# Patient Record
Sex: Female | Born: 1950 | Race: White | Hispanic: No | Smoking: Former smoker
Health system: Southern US, Community
[De-identification: ages and names within clinical notes are randomized; demographics above are authoritative.]

## PROBLEM LIST (undated history)

## (undated) DIAGNOSIS — E785 Hyperlipidemia, unspecified: Secondary | ICD-10-CM

## (undated) DIAGNOSIS — I4891 Unspecified atrial fibrillation: Secondary | ICD-10-CM

## (undated) DIAGNOSIS — J45909 Unspecified asthma, uncomplicated: Secondary | ICD-10-CM

## (undated) DIAGNOSIS — I5042 Chronic combined systolic (congestive) and diastolic (congestive) heart failure: Secondary | ICD-10-CM

## (undated) DIAGNOSIS — I509 Heart failure, unspecified: Secondary | ICD-10-CM

## (undated) DIAGNOSIS — I10 Essential (primary) hypertension: Secondary | ICD-10-CM

## (undated) HISTORY — DX: Unspecified atrial fibrillation: I48.91

## (undated) HISTORY — DX: Hyperlipidemia, unspecified: E78.5

## (undated) HISTORY — DX: Heart failure, unspecified: I50.9

---
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
## 2001-03-12 ENCOUNTER — Other Ambulatory Visit: Admission: RE | Admit: 2001-03-12 | Discharge: 2001-03-12 | Payer: Self-pay | Admitting: Obstetrics and Gynecology

## 2001-04-15 ENCOUNTER — Emergency Department (HOSPITAL_COMMUNITY): Admission: EM | Admit: 2001-04-15 | Discharge: 2001-04-15 | Payer: Self-pay | Admitting: Emergency Medicine

## 2001-05-11 DIAGNOSIS — K222 Esophageal obstruction: Secondary | ICD-10-CM

## 2001-05-12 ENCOUNTER — Other Ambulatory Visit: Admission: RE | Admit: 2001-05-12 | Discharge: 2001-05-12 | Payer: Self-pay | Admitting: Gastroenterology

## 2001-05-12 ENCOUNTER — Encounter (INDEPENDENT_AMBULATORY_CARE_PROVIDER_SITE_OTHER): Payer: Self-pay | Admitting: Specialist

## 2005-01-30 ENCOUNTER — Emergency Department (HOSPITAL_COMMUNITY): Admission: EM | Admit: 2005-01-30 | Discharge: 2005-01-30 | Payer: Self-pay | Admitting: Emergency Medicine

## 2007-06-10 ENCOUNTER — Ambulatory Visit (HOSPITAL_COMMUNITY): Admission: RE | Admit: 2007-06-10 | Discharge: 2007-06-10 | Payer: Self-pay | Admitting: *Deleted

## 2007-07-09 ENCOUNTER — Ambulatory Visit: Payer: Self-pay | Admitting: Family Medicine

## 2007-07-09 ENCOUNTER — Encounter (INDEPENDENT_AMBULATORY_CARE_PROVIDER_SITE_OTHER): Payer: Self-pay | Admitting: Internal Medicine

## 2007-07-09 DIAGNOSIS — F329 Major depressive disorder, single episode, unspecified: Secondary | ICD-10-CM | POA: Insufficient documentation

## 2007-07-09 DIAGNOSIS — I10 Essential (primary) hypertension: Secondary | ICD-10-CM | POA: Insufficient documentation

## 2007-07-09 DIAGNOSIS — D126 Benign neoplasm of colon, unspecified: Secondary | ICD-10-CM | POA: Insufficient documentation

## 2007-07-09 DIAGNOSIS — G43909 Migraine, unspecified, not intractable, without status migrainosus: Secondary | ICD-10-CM | POA: Insufficient documentation

## 2007-08-17 ENCOUNTER — Ambulatory Visit: Payer: Self-pay | Admitting: Family Medicine

## 2007-08-25 ENCOUNTER — Telehealth (INDEPENDENT_AMBULATORY_CARE_PROVIDER_SITE_OTHER): Payer: Self-pay | Admitting: *Deleted

## 2007-09-22 ENCOUNTER — Encounter (INDEPENDENT_AMBULATORY_CARE_PROVIDER_SITE_OTHER): Payer: Self-pay | Admitting: *Deleted

## 2007-12-17 ENCOUNTER — Encounter (INDEPENDENT_AMBULATORY_CARE_PROVIDER_SITE_OTHER): Payer: Self-pay | Admitting: Internal Medicine

## 2008-03-14 ENCOUNTER — Ambulatory Visit: Payer: Self-pay | Admitting: Family Medicine

## 2008-03-14 DIAGNOSIS — H60509 Unspecified acute noninfective otitis externa, unspecified ear: Secondary | ICD-10-CM

## 2008-03-21 ENCOUNTER — Ambulatory Visit: Payer: Self-pay

## 2008-03-21 ENCOUNTER — Encounter: Payer: Self-pay | Admitting: Family Medicine

## 2008-03-21 DIAGNOSIS — I359 Nonrheumatic aortic valve disorder, unspecified: Secondary | ICD-10-CM | POA: Insufficient documentation

## 2008-04-04 ENCOUNTER — Encounter (INDEPENDENT_AMBULATORY_CARE_PROVIDER_SITE_OTHER): Payer: Self-pay | Admitting: Internal Medicine

## 2008-04-19 ENCOUNTER — Encounter (INDEPENDENT_AMBULATORY_CARE_PROVIDER_SITE_OTHER): Payer: Self-pay | Admitting: *Deleted

## 2011-01-15 NOTE — Op Note (Signed)
NAME:  Lorraine Harris, Lorraine Harris           ACCOUNT NO.:  [id number]   MEDICAL RECORD NO.:  [id number]          PATIENT TYPE:  AMB   LOCATION:  SDS                          FACILITY:  [facility]   PHYSICIAN:  Mark C. Ophelia Charter, M.D.    DATE OF BIRTH:  1950/09/29   DATE OF PROCEDURE:  06/10/2007  DATE OF DISCHARGE:                               OPERATIVE REPORT   PREOPERATIVE DIAGNOSIS:  Left displaced angulated intra-articular distal  radius fracture with 2 distal fragments.   POSTOPERATIVE DIAGNOSIS:  Left displaced angulated intra-articular  distal radius fracture with 2 distal fragments.   PROCEDURE:  Open reduction and internal fixation, left distal radius,  volar plating.   SURGEON:  Annell Greening, MD   ANESTHESIA:  GOT.   TOURNIQUET TIME:  Twenty-six minutes.   DRAINS:  None.   DESCRIPTION OF PROCEDURE:  After induction of general anesthesia,  proximal arm tourniquet, standard prepping with DuraPrep and the usual  extremity sheets and drapes, a time-out procedure was taken.  The  patient received preoperative Ancef; this was in before the arm was  elevated, wrapped in an Esmarch and tourniquet inflated.   Incision was made adjacent to the radial artery and the flexor carpi  radialis sheath of the posterior aspect the flexor carpal radialis was  split.  The pronator was pulled from the radial aspect toward the ulna  and a self-retaining retractor was placed.  Fracture was reduced.  The  smaller DVR plate for the left wrist was selected, placed in appropriate  position and K-wire placed.  It was slightly distal and the reduction  had been lost slightly.  Reduction was repeated.  Hole was drilled in  one slot and the large purple screw, 12-mm bicortical, was placed  followed by a second screw, bicortical, slightly more proximal and  through the end of the plate.  Next, the reduction was performed and  then at the distal row was filled first, starting from the ulnar aspect,  catching the  lunate fragment, followed by progressing to the radial  aspect, catching the styloid fragment.  AP and lateral showed excellent  position.  Proximal rows were then filled.  All screws were tightened  down and all locked in place.  Final spot pictures were taken,  tourniquet was deflated and wound irrigated.  A 3-0 Vicryl subcutaneous  reapproximation of subcu tissue was performed with interrupted simple  sutures.  A nylon running subcuticular 4-0 suture was run with a  loop in the middle, tincture of Benzoin, Steri-Strips, Marcaine  infiltration, 4 x 4's, tweeners, Webril, dorsal wrist splint in neutral  position.  The patient tolerated the procedure well and was transferred  to the recovery room in stable condition.      Mark C. Ophelia Charter, M.D.  Electronically Signed     MCY/MEDQ  D:  06/10/2007  T:  06/11/2007  Job:  [id number]

## 2011-06-13 LAB — COMPREHENSIVE METABOLIC PANEL
ALT: 15
BUN: 8
CO2: 25
Calcium: 9.4
Chloride: 108
GFR calc Af Amer: >60
Sodium: 142
Total Bilirubin: 0.5

## 2011-06-13 LAB — CBC
Hemoglobin: 14.1
MCV: 93.8
Platelets: 275
RBC: 4.5
WBC: 7.2

## 2011-06-13 LAB — URINALYSIS, ROUTINE W REFLEX MICROSCOPIC
Bilirubin Urine: NEGATIVE
Hgb urine dipstick: NEGATIVE
Nitrite: NEGATIVE
Specific Gravity, Urine: 1.014
Urobilinogen, UA: 0.2

## 2012-11-04 ENCOUNTER — Other Ambulatory Visit: Payer: Self-pay | Admitting: Orthopaedic Surgery

## 2012-11-04 DIAGNOSIS — M79604 Pain in right leg: Secondary | ICD-10-CM

## 2012-11-04 DIAGNOSIS — R609 Edema, unspecified: Secondary | ICD-10-CM

## 2012-11-04 DIAGNOSIS — M79605 Pain in left leg: Secondary | ICD-10-CM

## 2012-11-09 ENCOUNTER — Ambulatory Visit
Admission: RE | Admit: 2012-11-09 | Discharge: 2012-11-09 | Disposition: A | Discharge status: Home / Self Care | Payer: BC Managed Care – PPO | Source: Ambulatory Visit | Attending: Orthopaedic Surgery | Admitting: Orthopaedic Surgery

## 2012-11-09 DIAGNOSIS — M79604 Pain in right leg: Secondary | ICD-10-CM

## 2012-11-09 DIAGNOSIS — R609 Edema, unspecified: Secondary | ICD-10-CM

## 2013-06-18 ENCOUNTER — Emergency Department (HOSPITAL_COMMUNITY)
Admission: EM | Admit: 2013-06-18 | Discharge: 2013-06-19 | Disposition: A | Discharge status: Home / Self Care | Payer: No Typology Code available for payment source | Attending: Emergency Medicine | Admitting: Emergency Medicine

## 2013-06-18 DIAGNOSIS — IMO0002 Reserved for concepts with insufficient information to code with codable children: Secondary | ICD-10-CM | POA: Insufficient documentation

## 2013-06-18 DIAGNOSIS — S3981XA Other specified injuries of abdomen, initial encounter: Secondary | ICD-10-CM | POA: Diagnosis not present

## 2013-06-18 DIAGNOSIS — Y9389 Activity, other specified: Secondary | ICD-10-CM | POA: Diagnosis not present

## 2013-06-18 DIAGNOSIS — S335XXA Sprain of ligaments of lumbar spine, initial encounter: Secondary | ICD-10-CM | POA: Diagnosis not present

## 2013-06-18 DIAGNOSIS — S8392XA Sprain of unspecified site of left knee, initial encounter: Secondary | ICD-10-CM

## 2013-06-18 DIAGNOSIS — Z79899 Other long term (current) drug therapy: Secondary | ICD-10-CM | POA: Insufficient documentation

## 2013-06-18 DIAGNOSIS — S298XXA Other specified injuries of thorax, initial encounter: Secondary | ICD-10-CM | POA: Diagnosis not present

## 2013-06-18 DIAGNOSIS — I10 Essential (primary) hypertension: Secondary | ICD-10-CM | POA: Diagnosis not present

## 2013-06-18 DIAGNOSIS — E669 Obesity, unspecified: Secondary | ICD-10-CM | POA: Diagnosis not present

## 2013-06-18 DIAGNOSIS — Y92410 Unspecified street and highway as the place of occurrence of the external cause: Secondary | ICD-10-CM | POA: Insufficient documentation

## 2013-06-18 DIAGNOSIS — J45909 Unspecified asthma, uncomplicated: Secondary | ICD-10-CM | POA: Insufficient documentation

## 2013-06-18 DIAGNOSIS — S39012A Strain of muscle, fascia and tendon of lower back, initial encounter: Secondary | ICD-10-CM

## 2013-06-18 HISTORY — DX: Essential (primary) hypertension: I10

## 2013-06-18 HISTORY — DX: Unspecified asthma, uncomplicated: J45.909

## 2013-06-18 NOTE — ED Notes (Signed)
Bed: WA09 Expected date:  Expected time:  Means of arrival:  Comments: EMS 
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
## 2013-06-19 ENCOUNTER — Encounter (HOSPITAL_COMMUNITY): Payer: Self-pay | Admitting: Emergency Medicine

## 2013-06-19 ENCOUNTER — Emergency Department (HOSPITAL_COMMUNITY): Payer: No Typology Code available for payment source

## 2013-06-19 DIAGNOSIS — S335XXA Sprain of ligaments of lumbar spine, initial encounter: Secondary | ICD-10-CM | POA: Diagnosis not present

## 2013-06-19 LAB — COMPREHENSIVE METABOLIC PANEL
Albumin: 3.8 g/dL (ref 3.5–5.2)
Alkaline Phosphatase: 99 U/L (ref 39–117)
BUN: 21 mg/dL (ref 6–23)
CO2: 23 mEq/L (ref 19–32)
Calcium: 8.8 mg/dL (ref 8.4–10.5)
Chloride: 105 mEq/L (ref 96–112)
Creatinine, Ser: 0.93 mg/dL (ref 0.50–1.10)
GFR calc non Af Amer: 64 mL/min — ABNORMAL LOW (ref >90)
Glucose, Bld: 119 mg/dL — ABNORMAL HIGH (ref 70–99)
Potassium: 3.5 mEq/L (ref 3.5–5.1)
Total Protein: 7 g/dL (ref 6.0–8.3)

## 2013-06-19 LAB — URINALYSIS, ROUTINE W REFLEX MICROSCOPIC
Bilirubin Urine: NEGATIVE
Glucose, UA: NEGATIVE mg/dL
Hgb urine dipstick: NEGATIVE
Ketones, ur: NEGATIVE mg/dL
Leukocytes, UA: NEGATIVE
Nitrite: NEGATIVE
Protein, ur: NEGATIVE mg/dL
pH: 5 (ref 5.0–8.0)

## 2013-06-19 LAB — POCT I-STAT CREATININE: Creatinine, Ser: 1.2 mg/dL — ABNORMAL HIGH (ref 0.50–1.10)

## 2013-06-19 LAB — CBC
MCHC: 34.2 g/dL (ref 30.0–36.0)
RBC: 4.47 MIL/uL (ref 3.87–5.11)
RDW: 13.6 % (ref 11.5–15.5)

## 2013-06-19 MED ORDER — DIPHENHYDRAMINE HCL 50 MG/ML IJ SOLN
25.0000 mg | Freq: Once | INTRAMUSCULAR | Status: AC
  Administered 2013-06-19: 25 mg via INTRAVENOUS

## 2013-06-19 MED ORDER — METHYLPREDNISOLONE SODIUM SUCC 125 MG IJ SOLR
125.0000 mg | Freq: Once | INTRAMUSCULAR | Status: AC
  Administered 2013-06-19: 125 mg via INTRAVENOUS
  Filled 2013-06-19: qty 2

## 2013-06-19 MED ORDER — EPINEPHRINE 0.3 MG/0.3ML IJ SOAJ
0.3000 mg | Freq: Once | INTRAMUSCULAR | Status: AC
  Administered 2013-06-19: 0.3 mg via INTRAMUSCULAR
  Filled 2013-06-19: qty 0.3

## 2013-06-19 MED ORDER — MORPHINE SULFATE 4 MG/ML IJ SOLN
4.0000 mg | Freq: Once | INTRAMUSCULAR | Status: AC
  Administered 2013-06-19: 4 mg via INTRAVENOUS
  Filled 2013-06-19: qty 1

## 2013-06-19 MED ORDER — ALBUTEROL SULFATE HFA 108 (90 BASE) MCG/ACT IN AERS
2.0000 | INHALATION_SPRAY | Freq: Once | RESPIRATORY_TRACT | Status: AC
  Administered 2013-06-19: 2 via RESPIRATORY_TRACT
  Filled 2013-06-19: qty 6.7

## 2013-06-19 MED ORDER — IOHEXOL 300 MG/ML  SOLN
100.0000 mL | Freq: Once | INTRAMUSCULAR | Status: AC | PRN
  Administered 2013-06-19: 100 mL via INTRAVENOUS

## 2013-06-19 MED ORDER — DIPHENHYDRAMINE HCL 50 MG/ML IJ SOLN
25.0000 mg | Freq: Once | INTRAMUSCULAR | Status: AC
  Administered 2013-06-19: 25 mg via INTRAVENOUS
  Filled 2013-06-19: qty 1
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
## 2014-09-14 ENCOUNTER — Encounter (HOSPITAL_COMMUNITY): Payer: Self-pay | Admitting: Pharmacy Technician

## 2014-09-20 ENCOUNTER — Ambulatory Visit (HOSPITAL_COMMUNITY)
Admission: RE | Admit: 2014-09-20 | Discharge: 2014-09-20 | Disposition: A | Discharge status: Home / Self Care | Payer: BLUE CROSS/BLUE SHIELD | Source: Ambulatory Visit | Attending: Cardiology | Admitting: Cardiology

## 2014-09-20 ENCOUNTER — Encounter (HOSPITAL_COMMUNITY)
Admission: RE | Disposition: A | Discharge status: Home / Self Care | Payer: Self-pay | Source: Ambulatory Visit | Attending: Cardiology

## 2014-09-20 ENCOUNTER — Encounter (HOSPITAL_COMMUNITY): Payer: Self-pay | Admitting: Cardiology

## 2014-09-20 DIAGNOSIS — I420 Dilated cardiomyopathy: Secondary | ICD-10-CM

## 2014-09-20 DIAGNOSIS — I429 Cardiomyopathy, unspecified: Secondary | ICD-10-CM | POA: Diagnosis not present

## 2014-09-20 DIAGNOSIS — I447 Left bundle-branch block, unspecified: Secondary | ICD-10-CM | POA: Diagnosis not present

## 2014-09-20 DIAGNOSIS — R0602 Shortness of breath: Secondary | ICD-10-CM | POA: Diagnosis present

## 2014-09-20 DIAGNOSIS — I10 Essential (primary) hypertension: Secondary | ICD-10-CM | POA: Diagnosis not present

## 2014-09-20 HISTORY — PX: LEFT HEART CATHETERIZATION WITH CORONARY ANGIOGRAM: SHX5451

## 2014-09-20 SURGERY — LEFT HEART CATHETERIZATION WITH CORONARY ANGIOGRAM
Anesthesia: LOCAL

## 2014-09-20 MED ORDER — SODIUM CHLORIDE 0.9 % IV SOLN: 1.0000 mL/kg/h | INTRAVENOUS | Status: DC

## 2014-09-20 MED ORDER — SODIUM CHLORIDE 0.9 % IV SOLN: 250.0000 mL | INTRAVENOUS | Status: DC | PRN

## 2014-09-20 MED ORDER — HEPARIN SODIUM (PORCINE) 1000 UNIT/ML IJ SOLN
INTRAMUSCULAR | Status: AC
  Filled 2014-09-20: qty 1

## 2014-09-20 MED ORDER — LIDOCAINE HCL (PF) 1 % IJ SOLN
INTRAMUSCULAR | Status: AC
  Filled 2014-09-20: qty 30

## 2014-09-20 MED ORDER — SODIUM CHLORIDE 0.9 % IV SOLN
INTRAVENOUS | Status: DC
  Administered 2014-09-20: 08:00:00 via INTRAVENOUS

## 2014-09-20 MED ORDER — SODIUM CHLORIDE 0.9 % IJ SOLN: 3.0000 mL | Freq: Two times a day (BID) | INTRAMUSCULAR | Status: DC

## 2014-09-20 MED ORDER — NITROGLYCERIN 1 MG/10 ML FOR IR/CATH LAB
INTRA_ARTERIAL | Status: AC
  Filled 2014-09-20: qty 10

## 2014-09-20 MED ORDER — HEPARIN (PORCINE) IN NACL 2-0.9 UNIT/ML-% IJ SOLN
INTRAMUSCULAR | Status: AC
  Filled 2014-09-20: qty 1000

## 2014-09-20 MED ORDER — MIDAZOLAM HCL 2 MG/2ML IJ SOLN
INTRAMUSCULAR | Status: AC
  Filled 2014-09-20: qty 2

## 2014-09-20 MED ORDER — ASPIRIN 81 MG PO CHEW
CHEWABLE_TABLET | ORAL | Status: AC
  Filled 2014-09-20: qty 1

## 2014-09-20 MED ORDER — ASPIRIN 81 MG PO CHEW
81.0000 mg | CHEWABLE_TABLET | ORAL | Status: AC
  Administered 2014-09-20: 81 mg via ORAL

## 2014-09-20 MED ORDER — FENTANYL CITRATE 0.05 MG/ML IJ SOLN
INTRAMUSCULAR | Status: AC
  Filled 2014-09-20: qty 2

## 2014-09-20 MED ORDER — SODIUM CHLORIDE 0.9 % IJ SOLN: 3.0000 mL | INTRAMUSCULAR | Status: DC | PRN

## 2014-09-20 MED ORDER — VERAPAMIL HCL 2.5 MG/ML IV SOLN
INTRAVENOUS | Status: AC
  Filled 2014-09-20: qty 2

## 2014-09-20 NOTE — CV Procedure (Signed)
Procedure performed:  Left heart catheterization including hemodynamic monitoring of the left ventricle, LV gram, selective right and left coronary arteriography. 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  Indication patient is a 64 year-old female with history of mil hypertension, morbid obesity who presented with syncope felt to be vasovagal and LBBB new. Out patient echo poor echo window and ef moderately depressed at 36%, stress testing revealing mild decrease in LVEF, EF 41% without definite reversible ischemia. However to evaluate cardiomyopathy and left bundle branch block, she was brought to the coronary angiography suite to evaluate. Ascending aortogram had to be performed as I had difficulty in cannulating the right coronary artery. A formal left ventricular gram could not be performed as I was unable to cross the aortic valve the second time, patient extremely difficult anatomy with severely tortuous subclavian artery and arch of the aorta, I was unable to access the left ventricle in spite of Rosen stiff wire and difficult to manipulate the catheters.  Hemodynamic data:  Left ventricular pressure was 152/7 with LVEDP of 0 mm mercury. Aortic pressure was 149/84 with a mean of 107 mm mercury. There was no pressure gradient across the aortic valve  Left ventricle: Performed in the RAO projection revealed LVEF  could not be determined as it was a hand contrast injection. I could not reevaluate LV function as I could not cross the aortic valve the second time around.  Right coronary artery: The vessel is smooth, normal, very small and codominant with RCA. 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 Has anterior origin. Difficult to cannulate.  Left main coronary artery is large and distal left main has eccentric 20% stenosis.  Circumflex coronary artery: A large vessel giving origin to a large obtuse marginal 1. It has mild luminal irregularity. It is codominant with right coronary  artery.   LAD:  LAD gives origin to a large diagonal-1, moderate D2.  LAD has mild luminal irregularities.   Left subclavian arteriogram: Severely tortuous. The vertebral artery is normal.  Ascending aorta: The aortic valve appears to be tricuspid. There is no ascending aortic aneurysm. There is no aortic regurgitation. Right coronary artery was faintly visualized. It appeared to have anterior origin.   Recommendation: Continued evaluation for nonischemic cardiomyopathies indicated. Consider radionuclide/MUGA scan to evaluate LV function if clinically indicated. No significant coronary artery disease.  Technique: Under sterile precautions using a 6 French right radial  arterial access, a 6 French sheath was introduced into the right radial artery. A 5 [place] Tig 4 catheter was advanced into the ascending aorta , advanced into the left ventricle and I attempted to perform hand injection of the left ventricle. I then pulled the catheter back into the ascending aorta, left main coronary artery was selected engaged and angiography was performed. I had great difficulty in manipulation of the catheters due to severe tortuosity. I then exchanged this catheter to a 5 [place] no torque catheter and I was unable to engage the RCA. At this point I decided to exchange the catheter to a pigtail catheter and attempted to cross the left ventricle. Due to inability to cross left ventricle, I decided to just perform ascending aortogram to evaluate the origin of right coronary artery. 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 Catheter was then exchanged over the J-wire.  Subclavian arteriogram was performed due to difficulty in accessing the ascending aorta through the arterial access. I  had to utilize a Glidewire to cross the severely tortuous subclavian artery and then rest of the procedure was performed over the Rosen wire to  straighten the tortuosity. There was no immediate complication. Patient tolerated the procedure well. A total of 1 50 mL of contrast was utilized for diagnostic angiography.  Rec: Medical therapy with aggressive risk factor reduction.   Disposition: Will be discharged home today with outpatient follow up.

## 2014-09-20 NOTE — Discharge Instructions (Signed)
Radial Site Care °Refer to this sheet in the next few weeks. These instructions provide you with information on caring for yourself after your procedure. Your caregiver may also give you more specific instructions. Your treatment has been planned according to current medical practices, but problems sometimes occur. Call your caregiver if you have any problems or questions after your procedure. °HOME CARE INSTRUCTIONS °· You may shower the day after the procedure. Remove the bandage (dressing) and gently wash the site with plain soap and water. Gently pat the site dry. °· Do not apply powder or lotion to the site. °· Do not submerge the affected site in water for 3 to 5 days. °· Inspect the site at least twice daily. °· Do not flex or bend the affected arm for 24 hours. °· No lifting over 5 pounds (2.3 kg) for 5 days after your procedure. °· Do not drive home if you are discharged the same day of the procedure. Have someone else drive you. °· You may drive 24 hours after the procedure unless otherwise instructed by your caregiver. °· Do not operate machinery or power tools for 24 hours. °· A responsible adult should be with you for the first 24 hours after you arrive home. °What to expect: °· Any bruising will usually fade within 1 to 2 weeks. °· Blood that collects in the tissue (hematoma) may be painful to the touch. It should usually decrease in size and tenderness within 1 to 2 weeks. °SEEK IMMEDIATE MEDICAL CARE IF: °· You have unusual pain at the radial site. °· You have redness, warmth, swelling, or pain at the radial site. °· You have drainage (other than a small amount of blood on the dressing). °· You have chills. °· You have a fever or persistent symptoms for more than 72 hours. °· You have a fever and your symptoms suddenly get worse. °· Your arm becomes pale, cool, tingly, or numb. °· You have heavy bleeding from the site. Hold pressure on the site and call 911. °Document Released: 09/21/2010 Document  Revised: 11/11/2011 Document Reviewed: 09/21/2010 °[employer]® Patient Information ©2015 [employer]. This information is not intended to replace advice given to you by your health care provider. Make sure you discuss any questions you have with your health care provider. ° °

## 2014-09-20 NOTE — H&P (Signed)
  Please see office visit notes for complete details of HPI.  

## 2014-09-20 NOTE — Progress Notes (Signed)
TR BAND REMOVAL  LOCATION:    right radial  DEFLATED PER PROTOCOL:    Yes.    TIME BAND OFF / DRESSING APPLIED:    1315    SITE UPON ARRIVAL:    Level 0  SITE AFTER BAND REMOVAL:    Level 0  CIRCULATION SENSATION AND MOVEMENT:    Within Normal Limits   Yes.    COMMENTS:   TRB REMOVED/ TEGADERM DSG APPLIED

## 2014-09-20 NOTE — Interval H&P Note (Signed)
History and Physical Interval Note:  09/20/2014 9:51 AM  Lorraine Harris  has presented today for surgery, with the diagnosis of sob, cardiomyopathy  The various methods of treatment have been discussed with the patient and family. After consideration of risks, benefits and other options for treatment, the patient has consented to  Procedure(s): LEFT HEART CATHETERIZATION WITH CORONARY ANGIOGRAM (N/A) and possible PCI as a surgical intervention .  The patient's history has been reviewed, patient examined, no change in status, stable for surgery.  I have reviewed the patient's chart and labs.  Questions were answered to the patient's satisfaction.   Cath Lab Visit (complete for each Cath Lab visit)  Clinical Evaluation Leading to the Procedure:   ACS: No.  Non-ACS:    Anginal Classification: CCS II  Anti-ischemic medical therapy: Minimal Therapy (1 class of medications)  Non-Invasive Test Results: Intermediate-risk stress test findings: cardiac mortality 1-3%/year  Prior CABG: No previous CABG        [facility]

## 2017-11-10 ENCOUNTER — Encounter: Payer: Self-pay | Admitting: Gastroenterology

## 2018-01-05 ENCOUNTER — Encounter: Payer: BLUE CROSS/BLUE SHIELD | Admitting: Gastroenterology

## 2018-06-26 ENCOUNTER — Other Ambulatory Visit: Payer: Self-pay | Admitting: Cardiology

## 2018-06-26 DIAGNOSIS — I429 Cardiomyopathy, unspecified: Secondary | ICD-10-CM

## 2018-07-10 ENCOUNTER — Ambulatory Visit (HOSPITAL_COMMUNITY)
Admission: RE | Admit: 2018-07-10 | Discharge: 2018-07-10 | Disposition: A | Discharge status: Home / Self Care | Payer: 59 | Source: Ambulatory Visit | Attending: Cardiology | Admitting: Cardiology

## 2018-07-10 DIAGNOSIS — Z6841 Body Mass Index (BMI) 40.0 and over, adult: Secondary | ICD-10-CM | POA: Diagnosis not present

## 2018-07-10 DIAGNOSIS — I10 Essential (primary) hypertension: Secondary | ICD-10-CM | POA: Insufficient documentation

## 2018-07-10 DIAGNOSIS — I350 Nonrheumatic aortic (valve) stenosis: Secondary | ICD-10-CM | POA: Diagnosis not present

## 2018-07-10 DIAGNOSIS — I429 Cardiomyopathy, unspecified: Secondary | ICD-10-CM | POA: Diagnosis present

## 2018-07-10 NOTE — Progress Notes (Signed)
Echocardiogram 2D Echocardiogram has been performed.  Matilde Bash 07/10/2018, 8:59 AM

## 2018-09-11 DIAGNOSIS — I447 Left bundle-branch block, unspecified: Secondary | ICD-10-CM | POA: Diagnosis not present

## 2018-09-11 DIAGNOSIS — R6884 Jaw pain: Secondary | ICD-10-CM | POA: Diagnosis not present

## 2018-09-11 DIAGNOSIS — I420 Dilated cardiomyopathy: Secondary | ICD-10-CM | POA: Diagnosis not present

## 2018-09-11 DIAGNOSIS — I5042 Chronic combined systolic (congestive) and diastolic (congestive) heart failure: Secondary | ICD-10-CM | POA: Diagnosis not present

## 2018-10-15 ENCOUNTER — Other Ambulatory Visit: Payer: Self-pay | Admitting: Cardiology

## 2018-12-16 ENCOUNTER — Other Ambulatory Visit: Payer: Self-pay

## 2018-12-16 ENCOUNTER — Other Ambulatory Visit: Payer: Self-pay | Admitting: Cardiology

## 2018-12-16 ENCOUNTER — Encounter: Payer: Self-pay | Admitting: Cardiology

## 2018-12-16 ENCOUNTER — Ambulatory Visit (INDEPENDENT_AMBULATORY_CARE_PROVIDER_SITE_OTHER): Payer: Medicare HMO | Admitting: Cardiology

## 2018-12-16 VITALS — BP 141/84 | HR 54 | Ht 64.0 in

## 2018-12-16 DIAGNOSIS — I420 Dilated cardiomyopathy: Secondary | ICD-10-CM | POA: Diagnosis not present

## 2018-12-16 DIAGNOSIS — E782 Mixed hyperlipidemia: Secondary | ICD-10-CM | POA: Diagnosis not present

## 2018-12-16 DIAGNOSIS — I5042 Chronic combined systolic (congestive) and diastolic (congestive) heart failure: Secondary | ICD-10-CM | POA: Diagnosis not present

## 2018-12-16 DIAGNOSIS — I10 Essential (primary) hypertension: Secondary | ICD-10-CM

## 2018-12-16 DIAGNOSIS — R6884 Jaw pain: Secondary | ICD-10-CM

## 2018-12-16 MED ORDER — ATORVASTATIN CALCIUM 10 MG PO TABS: 10.0000 mg | ORAL_TABLET | Freq: Every day | ORAL | 3 refills | Status: DC

## 2018-12-16 NOTE — Telephone Encounter (Signed)
fill

## 2018-12-16 NOTE — Progress Notes (Signed)
Subjective:   Lorraine Harris, female    DOB: [birth year], 68 y.o.   MRN: [id number]  Patient, No Pcp Per:  Chief Complaint  Patient presents with  . Hypertension  . Follow-up   This visit type was conducted due to national recommendations for restrictions regarding the COVID-19 Pandemic (e.g. social distancing).  This format is felt to be most appropriate for this patient at this time.  All issues noted in this document were discussed and addressed.  No physical exam was performed (except for noted visual exam findings with Telehealth visits).  The patient has consented to conduct a Telehealth visit and understands insurance will be billed.   I discussed the limitations of evaluation and management by telemedicine and the availability of in person appointments. The patient expressed understanding and agreed to proceed.  Virtual Visit via Video Note is as below  I connected with Lorraine Harris, on 12/17/18 at 0900 by a video enabled telemedicine application and verified that I am speaking with the correct person using two identifiers.     I have discussed with her regarding the safety during COVID Pandemic and steps and precautions including social distancing with the patient.    HPI: Lorraine Harris  is a 68 y.o. female  with morbid obesity, H/O non ischemic cardiomyopathy with normal coronary arteries by angio on 09/20/2014. HTN, bronchial asthma, and depression. She also was found to have left bundle branch block at that time. She has chronic shortness breath and dyspnea on exertion due to obesity and also prior history of tobacco use which she has quit.  Patient recently underwent echocardiogram that revealed depressed LVEF of 40-45% with grade 1 diastolic dysfunction. No significant changes compared to previous echocardiogram in 2017. I had started her on Entresto; however, patient lost her job and was unable to afford the medication. She is now on Valsartan.   This is a  3 month virtual visit. She continues to do well with improvement in symptoms of leg swelling and dyspnea on exertion since being on Valsartan and lasix. She is having a difficult time with depression currently. Not only has she lost her job in the last few months, her mother that she cared for has also passed away as well as a Aunt unexpectedly. She is supposed to start grief counseling next week.   She believes that she has lost weight, but does not have a scale at home to weigh. She did not have ordered labs performed. She does continue to have occasional jaw pain that is mostly on the right side and has also developed pain in the right ear. Jaw pain is not associated with exertion.   Past Medical History:  Diagnosis Date  . Asthma   . Hypertension     Past Surgical History:  Procedure Laterality Date  . LEFT HEART CATHETERIZATION WITH CORONARY ANGIOGRAM N/A 09/20/2014   Procedure: LEFT HEART CATHETERIZATION WITH CORONARY ANGIOGRAM;  Surgeon: Laverda Page, MD;  Location: [facility] CATH LAB;  Service: Cardiovascular;  Laterality: N/A;    History reviewed. No pertinent family history.  Social History   Socioeconomic History  . Marital status: Divorced    Spouse name: Not on file  . Number of children: Not on file  . Years of education: Not on file  . Highest education level: Not on file  Occupational History  . Not on file  Social Needs  . Financial resource strain: Not on file  . Food insecurity:  Worry: Not on file    Inability: Not on file  . Transportation needs:    Medical: Not on file    Non-medical: Not on file  Tobacco Use  . Smoking status: Never Smoker  . Smokeless tobacco: Never Used  Substance and Sexual Activity  . Alcohol use: No  . Drug use: No  . Sexual activity: Not on file  Lifestyle  . Physical activity:    Days per week: Not on file    Minutes per session: Not on file  . Stress: Not on file  Relationships  . Social connections:    Talks on phone:  Not on file    Gets together: Not on file    Attends religious service: Not on file    Active member of club or organization: Not on file    Attends meetings of clubs or organizations: Not on file    Relationship status: Not on file  . Intimate partner violence:    Fear of current or ex partner: Not on file    Emotionally abused: Not on file    Physically abused: Not on file    Forced sexual activity: Not on file  Other Topics Concern  . Not on file  Social History Narrative  . Not on file    Current Meds  Medication Sig  . albuterol (PROVENTIL HFA;VENTOLIN HFA) 108 (90 BASE) MCG/ACT inhaler Inhale 1 puff into the lungs every 6 (six) hours as needed for wheezing or shortness of breath.  . ALPRAZolam (XANAX) 0.5 MG tablet Take 1 tablet by mouth 3 (three) times daily as needed for anxiety.   Marland Kitchen atorvastatin (LIPITOR) 10 MG tablet Take 1 tablet (10 mg total) by mouth daily.  . metoprolol (LOPRESSOR) 50 MG tablet Take 50 mg by mouth 2 (two) times daily.  . nitroGLYCERIN (NITROSTAT) 0.4 MG SL tablet as needed.  Marland Kitchen spironolactone (ALDACTONE) 25 MG tablet Take 25 mg by mouth every morning.  . valsartan (DIOVAN) 160 MG tablet Take 160 mg by mouth daily.  Marland Kitchen venlafaxine XR (EFFEXOR-XR) 150 MG 24 hr capsule Take 1 capsule by mouth every morning.  . [DISCONTINUED] atorvastatin (LIPITOR) 10 MG tablet Take 10 mg by mouth daily.  . [DISCONTINUED] furosemide (LASIX) 40 MG tablet TAKE 1 TABLET BY MOUTH EVERY DAY     Review of Systems  Constitution: Negative for decreased appetite, malaise/fatigue, weight gain and weight loss.  Eyes: Negative for visual disturbance.  Cardiovascular: Positive for dyspnea on exertion and leg swelling. Negative for chest pain, claudication, orthopnea, palpitations and syncope.  Respiratory: Negative for hemoptysis and wheezing.   Endocrine: Negative for cold intolerance and heat intolerance.  Hematologic/Lymphatic: Does not bruise/bleed easily.  Skin: Negative for  nail changes.  Musculoskeletal: Negative for muscle weakness and myalgias.  Gastrointestinal: Negative for abdominal pain, change in bowel habit, nausea and vomiting.  Neurological: Negative for difficulty with concentration, dizziness, focal weakness and headaches.  Psychiatric/Behavioral: Positive for depression. Negative for altered mental status and suicidal ideas.  All other systems reviewed and are negative.      Objective:     Blood pressure (!) 141/84, pulse (!) 54, height _0  (1.626 m).  Cardiac studies:  Echo 07/10/2018: Left ventricle: There was mild concentric hypertrophy. The estimated ejection fraction was in the range of 40% to 45%. Mild diffuse hypokinesis. Although no diagnostic regional wall motion abnormality was identified, this possibility cannot be completely excluded on the basis of this study. Features are consistent with a pseudonormal left  ventricular filling pattern, with concomitant abnormal relaxation and increased filling pressure (grade 2 diastolic dysfunction). - Ventricular septum: These changes are consistent with a left bundle branch block. - Aortic valve: Mildly calcified annulus. Probably trileaflet; mildly calcified leaflets. There was mild stenosis. Mean gradient (S): 11 mm Hg. Peak gradient (S): 24 mm Hg. Valve area (VTI): 2.9 cm^2. Valve area (Vmax): 2.63 cm^2. Valve area (Vmean): 2.72 cm^2. - Mitral valve: Calcified annulus. Valve area by pressure half-time: 2.2 cm^2. - Left atrium: The atrium was moderately dilated.  Lower extremity venous duplex 07/16/2018: No evidence of deep vein thrombosis of the left lower extremity with normal venous return. Right femoral vein patent.  Coronary angiogram 09/20/2014: No significant coronary artery disease.  Left dominant circulation.  Ascending aortogram normal.  Anterior origin  of a small RCA. LV not evaluated due to inability to cross AV   Recent Labs: 08/14/2018: Glucose 140,  creatinine 0.98, EGFR 59, potassium 4.1, CMP otherwise normal.  CBC normal.  BNP 47.6.  TSH 3.4.  Physical Exam  Constitutional: She is oriented to person, place, and time. She appears well-developed and well-nourished. No distress.  HENT:  Head: Normocephalic.  Pulmonary/Chest: Effort normal. No respiratory distress.  Musculoskeletal:        General: No edema.  Neurological: She is alert and oriented to person, place, and time.  Psychiatric: She has a normal mood and affect. Her behavior is normal.  Vitals reviewed.      Assessment & Recommendations:  1. Dilated cardiomyopathy ([place]) Symptomatically she is doing well. On appropriate medical therapy, will continue the same.   2. Chronic combined systolic and diastolic heart failure ([place]) Has remained stable. Continue with current medical therapy. She will need continued lifestyle modifications.   3. Essential hypertension Well controlled. Continue with home monitoring.  4. Mixed hyperlipidemia Found to not be on statin, although was previously on Lipitor and tolerated this well. Lipids have been slightly elevated in the past. She would benefit from at least small dose of statin in view of her risk factors and previous lipid levels. Will re-prescribe lipitor 10 mg daily. Will add lipid panel to her previously ordered blood work for follow up.   5. Morbid obesity ([place]) She is unsure of her current weight, but feels that she has continued to lose weight. I have again stressed the importance of this. Diet modifications and regular exercise were encouraged.  6. Jaw pain Not related to exertion. In view of her also having ear pain, suspect may be related to this or possible dental etiology. Encouraged her to be seen by Urgent care for possible virtual visit for evaluation of this. She does not currently have PCP, in which I have advised her to establish with one.    Plan: Overall, symptomatically patient appears to be doing well. She  will have lipids and CMP performed before her next office visit.    Jeri Lager, MSN, APRN, FNP-C [facility], [place] Office: [phone] Fax: [phone]

## 2018-12-17 ENCOUNTER — Encounter: Payer: Self-pay | Admitting: Cardiology

## 2018-12-17 DIAGNOSIS — I5042 Chronic combined systolic (congestive) and diastolic (congestive) heart failure: Secondary | ICD-10-CM | POA: Insufficient documentation

## 2018-12-17 DIAGNOSIS — E782 Mixed hyperlipidemia: Secondary | ICD-10-CM | POA: Insufficient documentation

## 2018-12-17 HISTORY — DX: Chronic combined systolic (congestive) and diastolic (congestive) heart failure: I50.42

## 2019-01-13 ENCOUNTER — Other Ambulatory Visit: Payer: Self-pay | Admitting: Cardiology

## 2019-01-13 ENCOUNTER — Telehealth: Payer: Self-pay

## 2019-01-13 NOTE — Telephone Encounter (Signed)
Please fill

## 2019-02-09 ENCOUNTER — Emergency Department (HOSPITAL_COMMUNITY)
Admission: EM | Admit: 2019-02-09 | Discharge: 2019-02-09 | Disposition: A | Discharge status: Home / Self Care | Payer: Medicare HMO | Attending: Emergency Medicine | Admitting: Emergency Medicine

## 2019-02-09 ENCOUNTER — Emergency Department (HOSPITAL_COMMUNITY): Payer: Medicare HMO

## 2019-02-09 ENCOUNTER — Encounter (HOSPITAL_COMMUNITY): Payer: Self-pay

## 2019-02-09 ENCOUNTER — Other Ambulatory Visit: Payer: Self-pay

## 2019-02-09 DIAGNOSIS — R0902 Hypoxemia: Secondary | ICD-10-CM | POA: Diagnosis not present

## 2019-02-09 DIAGNOSIS — Z87891 Personal history of nicotine dependence: Secondary | ICD-10-CM | POA: Insufficient documentation

## 2019-02-09 DIAGNOSIS — I499 Cardiac arrhythmia, unspecified: Secondary | ICD-10-CM | POA: Diagnosis not present

## 2019-02-09 DIAGNOSIS — I10 Essential (primary) hypertension: Secondary | ICD-10-CM | POA: Diagnosis not present

## 2019-02-09 DIAGNOSIS — R0600 Dyspnea, unspecified: Secondary | ICD-10-CM | POA: Diagnosis not present

## 2019-02-09 DIAGNOSIS — I509 Heart failure, unspecified: Secondary | ICD-10-CM | POA: Diagnosis not present

## 2019-02-09 DIAGNOSIS — I4891 Unspecified atrial fibrillation: Secondary | ICD-10-CM | POA: Diagnosis not present

## 2019-02-09 DIAGNOSIS — Z8709 Personal history of other diseases of the respiratory system: Secondary | ICD-10-CM | POA: Insufficient documentation

## 2019-02-09 DIAGNOSIS — Z79899 Other long term (current) drug therapy: Secondary | ICD-10-CM | POA: Diagnosis not present

## 2019-02-09 DIAGNOSIS — Z91040 Latex allergy status: Secondary | ICD-10-CM | POA: Insufficient documentation

## 2019-02-09 DIAGNOSIS — R0602 Shortness of breath: Secondary | ICD-10-CM | POA: Diagnosis not present

## 2019-02-09 DIAGNOSIS — I447 Left bundle-branch block, unspecified: Secondary | ICD-10-CM | POA: Diagnosis not present

## 2019-02-09 LAB — COMPREHENSIVE METABOLIC PANEL
ALT: 12 U/L (ref 0–44)
AST: 13 U/L — ABNORMAL LOW (ref 15–41)
Albumin: 3.5 g/dL (ref 3.5–5.0)
Alkaline Phosphatase: 87 U/L (ref 38–126)
Anion gap: 14 (ref 5–15)
BUN: 15 mg/dL (ref 8–23)
CO2: 23 mmol/L (ref 22–32)
Calcium: 9.2 mg/dL (ref 8.9–10.3)
Chloride: 102 mmol/L (ref 98–111)
Creatinine, Ser: 1.15 mg/dL — ABNORMAL HIGH (ref 0.44–1.00)
GFR calc Af Amer: 57 mL/min — ABNORMAL LOW (ref >60)
GFR calc non Af Amer: 49 mL/min — ABNORMAL LOW (ref >60)
Glucose, Bld: 127 mg/dL — ABNORMAL HIGH (ref 70–99)
Potassium: 3.8 mmol/L (ref 3.5–5.1)
Sodium: 139 mmol/L (ref 135–145)
Total Bilirubin: 0.4 mg/dL (ref 0.3–1.2)
Total Protein: 6.5 g/dL (ref 6.5–8.1)

## 2019-02-09 LAB — CBC WITH DIFFERENTIAL/PLATELET
Abs Immature Granulocytes: 0.02 10*3/uL (ref 0.00–0.07)
Basophils Absolute: 0 10*3/uL (ref 0.0–0.1)
Basophils Relative: 1 %
Eosinophils Absolute: 0.3 10*3/uL (ref 0.0–0.5)
Eosinophils Relative: 4 %
HCT: 46.4 % — ABNORMAL HIGH (ref 36.0–46.0)
Hemoglobin: 15 g/dL (ref 12.0–15.0)
Immature Granulocytes: 0 %
Lymphocytes Relative: 17 %
Lymphs Abs: 1.5 10*3/uL (ref 0.7–4.0)
MCH: 28.7 pg (ref 26.0–34.0)
MCHC: 32.3 g/dL (ref 30.0–36.0)
MCV: 88.9 fL (ref 80.0–100.0)
Monocytes Absolute: 0.5 10*3/uL (ref 0.1–1.0)
Monocytes Relative: 6 %
Neutro Abs: 6.1 10*3/uL (ref 1.7–7.7)
Neutrophils Relative %: 72 %
Platelets: 259 10*3/uL (ref 150–400)
RBC: 5.22 MIL/uL — ABNORMAL HIGH (ref 3.87–5.11)
RDW: 14.9 % (ref 11.5–15.5)
WBC: 8.5 10*3/uL (ref 4.0–10.5)
nRBC: 0 % (ref 0.0–0.2)

## 2019-02-09 LAB — TROPONIN I: Troponin I: <0.03 ng/mL (ref <0.03)

## 2019-02-09 LAB — BRAIN NATRIURETIC PEPTIDE: B Natriuretic Peptide: 209.2 pg/mL — ABNORMAL HIGH (ref 0.0–100.0)

## 2019-02-09 NOTE — ED Triage Notes (Signed)
Pt arrives EMS from home with c/o [facility] since 9 pm last night. Pt initial EKG per EMS shows rate of 168 with VT vs Afib RVR . Changed to SR 88 with no intervention.Very SHOB with exertion. Sat 95 on RA.

## 2019-02-09 NOTE — ED Notes (Signed)
Pt states" I feel a panic attack comning on. " Pt intermittently tearful.

## 2019-02-09 NOTE — ED Notes (Signed)
Pt ambulated around the room with no assistance. Sp O2 while ambulating remained at 94% and BP post-ambulating was 118/88. Pt reported SOB after ambulating.

## 2019-02-09 NOTE — ED Provider Notes (Signed)
[facility] EMERGENCY DEPARTMENT Provider Note   CSN: [id number] Arrival date & time: 02/09/19  0759    History   Chief Complaint Chief Complaint  Patient presents with  . Shortness of Breath    HPI Lorraine Harris is a 68 y.o. female.     HPI Patient is a 68 year old female with a history of nonischemic cardiomyopathy who presents the emergency department with complaints of shortness of breath last night.  Per EMS her initial EKG was 168 but this converted to a sinus rhythm without intervention.  She feels much better at this time.  She denies palpitations.  Denies fevers and chills.  She states that she became emotionally upset last night when thinking of her recently deceased mother.  She feels like that is what exacerbated the majority of her symptoms.  Throughout much of last night she felt short of breath but now feels asymptomatic and feels back to baseline.  No active chest pain or chest pressure at this time.  No cough or fevers or chills.  No other complaints.  Denies presyncope or syncope    Past Medical History:  Diagnosis Date  . Asthma   . CHF (congestive heart failure) ([place])   . Hyperlipidemia   . Hypertension     Patient Active Problem List   Diagnosis Date Noted  . Chronic combined systolic and diastolic heart failure ([place]) 12/17/2018  . Mixed hyperlipidemia 12/17/2018  . Dilated cardiomyopathy ([place]) 09/20/2014  . LBBB (left bundle branch block) 09/20/2014  . AORTIC VALVE DISORDERS 03/21/2008  . OTITIS EXTERNA, ACUTE, LEFT 03/14/2008  . COLONIC POLYPS, BENIGN 07/09/2007  . Morbid obesity ([place]) 07/09/2007  . DEPRESSION 07/09/2007  . MIGRAINE HEADACHE 07/09/2007  . Essential hypertension 07/09/2007  . ESOPHAGEAL STRICTURE 05/11/2001    Past Surgical History:  Procedure Laterality Date  . LEFT HEART CATHETERIZATION WITH CORONARY ANGIOGRAM N/A 09/20/2014   Procedure: LEFT HEART CATHETERIZATION WITH CORONARY ANGIOGRAM;  Surgeon:  Laverda Page, MD;  Location: [facility] CATH LAB;  Service: Cardiovascular;  Laterality: N/A;     OB History   No obstetric history on file.      Home Medications    Prior to Admission medications   Medication Sig Start Date End Date Taking? Authorizing Provider  metoprolol (LOPRESSOR) 50 MG tablet Take 50 mg by mouth daily at 12 noon.    Yes [provider]  venlafaxine XR (EFFEXOR-XR) 150 MG 24 hr capsule Take 1 capsule by mouth every morning. 06/01/13  Yes [provider]  albuterol (PROVENTIL HFA;VENTOLIN HFA) 108 (90 BASE) MCG/ACT inhaler Inhale 1 puff into the lungs every 6 (six) hours as needed for wheezing or shortness of breath.    [provider]  ALPRAZolam Duanne Moron) 0.5 MG tablet Take 1 tablet by mouth 3 (three) times daily as needed for anxiety.  06/17/13   [provider]  atorvastatin (LIPITOR) 10 MG tablet Take 1 tablet (10 mg total) by mouth daily. 12/16/18   Miquel Dunn, NP  furosemide (LASIX) 40 MG tablet TAKE 1 TABLET BY MOUTH EVERY DAY 12/16/18   Miquel Dunn, NP  nitroGLYCERIN (NITROSTAT) 0.4 MG SL tablet as needed. 08/12/18   [provider]  spironolactone (ALDACTONE) 25 MG tablet TAKE 1 TABLET BY MOUTH EVERY MORNING 01/13/19   Miquel Dunn, NP  valsartan (DIOVAN) 160 MG tablet Take 160 mg by mouth daily. 09/16/18   [provider]    Family History Family History  Problem Relation Age  of Onset  . Kidney disease Father   . Heart attack Father   . Heart disease Sister     Social History Social History   Tobacco Use  . Smoking status: Former Smoker    Last attempt to quit: 2012    Years since quitting: 8.4  . Smokeless tobacco: Never Used  Substance Use Topics  . Alcohol use: No  . Drug use: No     Allergies   Morphine and related; Valium [diazepam]; and Latex   Review of Systems Review of Systems  All other systems reviewed and are negative.    Physical Exam Updated  Vital Signs BP 133/75 (BP Location: Right Arm)   Pulse 79   Temp 98 F (36.7 C) (Oral)   Resp (!) 21   Ht 5\' 4"  (1.626 m)   Wt 136.1 kg   SpO2 94%   BMI 51.49 kg/m   Physical Exam Vitals signs and nursing note reviewed.  Constitutional:      General: She is not in acute distress.    Appearance: She is well-developed.  HENT:     Head: Normocephalic and atraumatic.  Neck:     Musculoskeletal: Normal range of motion.  Cardiovascular:     Rate and Rhythm: Normal rate and regular rhythm.     Heart sounds: Normal heart sounds.  Pulmonary:     Effort: Pulmonary effort is normal.     Breath sounds: Normal breath sounds.  Abdominal:     General: There is no distension.     Palpations: Abdomen is soft.     Tenderness: There is no abdominal tenderness.  Musculoskeletal: Normal range of motion.  Skin:    General: Skin is warm and dry.  Neurological:     Mental Status: She is alert and oriented to person, place, and time.  Psychiatric:        Judgment: Judgment normal.      ED Treatments / Results  Labs (all labs ordered are listed, but only abnormal results are displayed) Labs Reviewed  CBC WITH DIFFERENTIAL/PLATELET - Abnormal; Notable for the following components:      Result Value   RBC 5.22 (*)    HCT 46.4 (*)    All other components within normal limits  COMPREHENSIVE METABOLIC PANEL - Abnormal; Notable for the following components:   Glucose, Bld 127 (*)    Creatinine, Ser 1.15 (*)    AST 13 (*)    GFR calc non Af Amer 49 (*)    GFR calc Af Amer 57 (*)    All other components within normal limits  TROPONIN I  BRAIN NATRIURETIC PEPTIDE    EKG EKG Interpretation  Date/Time:  Tuesday February 09 2019 08:11:17 EDT Ventricular Rate:  75 PR Interval:    QRS Duration: 159 QT Interval:  395 QTC Calculation: 442 R Axis:   -7 Text Interpretation:  Sinus rhythm Left bundle branch block Baseline wander in lead(s) III V1 V2 No significant change was found Confirmed by  Jola Schmidt [phone]) on 02/09/2019 8:32:48 AM   Radiology Dg Chest Portable 1 View  Result Date: 02/09/2019 CLINICAL DATA:  68 year old female with a history chest pain and shortness of breath EXAM: PORTABLE CHEST 1 VIEW COMPARISON:  06/19/2013, 06/09/2007, CT chest 06/19/2013 FINDINGS: Cardiomediastinal silhouette unchanged in size and contour. No evidence of central vascular congestion. No interlobular septal thickening. No pneumothorax or pleural effusion. No confluent airspace disease. No displaced fracture IMPRESSION: Negative for acute cardiopulmonary disease Electronically Signed  By: Corrie Mckusick D.O.   On: 02/09/2019 08:54    Procedures Procedures (including critical care time)  Medications Ordered in ED Medications - No data to display   Initial Impression / Assessment and Plan / ED Course  I have reviewed the triage vital signs and the nursing notes.  Pertinent labs & imaging results that were available during my care of the patient were reviewed by me and considered in my medical decision making (see chart for details).        Pt continues to feel well.  Work-up in the emergency department is without significant abnormality.  Discharged home in good condition.  Primary care follow-up.  Patient understands return to the ER for new or worsening symptoms  Final Clinical Impressions(s) / ED Diagnoses   Final diagnoses:  Dyspnea, unspecified type    ED Discharge Orders    None       Jola Schmidt, MD 02/09/19 1050

## 2019-02-11 ENCOUNTER — Encounter: Payer: Self-pay | Admitting: Cardiology

## 2019-02-12 ENCOUNTER — Encounter: Payer: Self-pay | Admitting: Cardiology

## 2019-02-12 ENCOUNTER — Ambulatory Visit (INDEPENDENT_AMBULATORY_CARE_PROVIDER_SITE_OTHER): Payer: Medicare HMO | Admitting: Cardiology

## 2019-02-12 ENCOUNTER — Other Ambulatory Visit: Payer: Self-pay

## 2019-02-12 VITALS — BP 118/68 | HR 59 | Ht 63.0 in | Wt 300.0 lb

## 2019-02-12 DIAGNOSIS — I10 Essential (primary) hypertension: Secondary | ICD-10-CM | POA: Diagnosis not present

## 2019-02-12 DIAGNOSIS — E785 Hyperlipidemia, unspecified: Secondary | ICD-10-CM | POA: Diagnosis not present

## 2019-02-12 DIAGNOSIS — Z6841 Body Mass Index (BMI) 40.0 and over, adult: Secondary | ICD-10-CM

## 2019-02-12 DIAGNOSIS — I5042 Chronic combined systolic (congestive) and diastolic (congestive) heart failure: Secondary | ICD-10-CM | POA: Diagnosis not present

## 2019-02-12 DIAGNOSIS — I480 Paroxysmal atrial fibrillation: Secondary | ICD-10-CM

## 2019-02-12 DIAGNOSIS — I447 Left bundle-branch block, unspecified: Secondary | ICD-10-CM

## 2019-02-12 MED ORDER — RIVAROXABAN 15 MG PO TABS: 15.0000 mg | ORAL_TABLET | Freq: Every day | ORAL | 6 refills | Status: DC

## 2019-02-12 NOTE — Progress Notes (Signed)
Virtual Visit via Video Note: This visit type was conducted due to national recommendations for restrictions regarding the COVID-19 Pandemic (e.g. social distancing).  This format is felt to be most appropriate for this patient at this time.  All issues noted in this document were discussed and addressed.  No physical exam was performed (except for noted visual exam findings with Telehealth visits).  The patient has consented to conduct a Telehealth visit and understands insurance will be billed.   I connected with@, on 02/12/19 at  by a video enabled telemedicine application and verified that I am speaking with the correct person using two identifiers.   I discussed the limitations of evaluation and management by telemedicine and the availability of in person appointments. The patient expressed understanding and agreed to proceed.   I have discussed with patient regarding the safety during COVID Pandemic and steps and precautions to be taken including social distancing, frequent hand wash and use of detergent soap, gels with the patient. I asked the patient to avoid touching mouth, nose, eyes, ears with the hands. I encouraged regular walking around the neighborhood and exercise and regular diet, as long as social distancing can be maintained.  Primary Physician/Referring:  Everardo Beals, NP  Patient ID: Lorraine Harris, female    DOB: [birth year], 68 y.o.   MRN: [id number]  Chief Complaint  Patient presents with  . Hypertension  . Cardiomyopathy  . Congestive Heart Failure    HPI: [name]  is a 68 y.o. female  with  morbid obesity, H/O non ischemic cardiomyopathy with normal coronary arteries by angio on 09/20/2014. HTN, bronchial asthma, and depression. She also was found to have left bundle branch block at that time. She has chronic shortness breath and dyspnea on exertion due to obesity and also prior history of tobacco use which she has quit.   She was seen in the  emergency room on 02/09/2019 with chest tightness jaw pain suggestive of angina pectoris that she has always complained off and marked dyspnea and EKG by the EMS had revealed A. fib with RVR, wide-complex rhythm.  He was evaluated in the ED and she was back in sinus rhythm with underlying left bundle branch block, cardiac markers were negative, she was discharged home with a recommendation for follow-up in the outpatient basis.  I am seeing her on 02/12/2019 for follow-up, she is presently doing well, still in bereavement after recent mother's death.  Otherwise states that she has not had any further jaw pain, dyspnea is remained stable, no PND orthopnea, no palpitations.  Past Medical History:  Diagnosis Date  . Asthma   . CHF (congestive heart failure) ([place])   . Hyperlipidemia   . Hypertension     Past Surgical History:  Procedure Laterality Date  . LEFT HEART CATHETERIZATION WITH CORONARY ANGIOGRAM N/A 09/20/2014   Procedure: LEFT HEART CATHETERIZATION WITH CORONARY ANGIOGRAM;  Surgeon: Laverda Page, MD;  Location: [facility] CATH LAB;  Service: Cardiovascular;  Laterality: N/A;    Social History   Socioeconomic History  . Marital status: Divorced    Spouse name: Not on file  . Number of children: 1  . Years of education: Not on file  . Highest education level: Not on file  Occupational History  . Not on file  Social Needs  . Financial resource strain: Not on file  . Food insecurity    Worry: Not on file    Inability: Not on file  . Transportation needs  Medical: Not on file    Non-medical: Not on file  Tobacco Use  . Smoking status: Former Smoker    Quit date: 2012    Years since quitting: 8.4  . Smokeless tobacco: Never Used  Substance and Sexual Activity  . Alcohol use: No  . Drug use: No  . Sexual activity: Not on file  Lifestyle  . Physical activity    Days per week: Not on file    Minutes per session: Not on file  . Stress: Not on file  Relationships  . Social  Herbalist on phone: Not on file    Gets together: Not on file    Attends religious service: Not on file    Active member of club or organization: Not on file    Attends meetings of clubs or organizations: Not on file    Relationship status: Not on file  . Intimate partner violence    Fear of current or ex partner: Not on file    Emotionally abused: Not on file    Physically abused: Not on file    Forced sexual activity: Not on file  Other Topics Concern  . Not on file  Social History Narrative  . Not on file   Review of Systems  Constitution: Positive for malaise/fatigue. Negative for chills, decreased appetite and weight gain.  Cardiovascular: Positive for dyspnea on exertion. Negative for leg swelling and syncope.  Endocrine: Negative for cold intolerance.  Hematologic/Lymphatic: Does not bruise/bleed easily.  Musculoskeletal: Positive for back pain. Negative for joint swelling.  Gastrointestinal: Negative for abdominal pain, anorexia, change in bowel habit, hematochezia and melena.  Neurological: Negative for headaches and light-headedness.  Psychiatric/Behavioral: Positive for depression. Negative for substance abuse. The patient is nervous/anxious.   All other systems reviewed and are negative.     Objective  Blood pressure 118/68, pulse (!) 59, height 5\' 3"  (1.6 m), weight 300 lb (136.1 kg). Body mass index is 53.14 kg/m.  Physical exam not performed or limited due to virtual visit.  Patient appeared to be in no distress, short Neck, respiration was not labored.  Please see exam details from prior visit is as below.    Physical Exam  Constitutional: She appears well-developed. No distress.  Morbidly obese  HENT:  Head: Atraumatic.  Eyes: Conjunctivae are normal.  Neck: Neck supple. No thyromegaly present.  Short neck and difficult to evaluate JVP  Cardiovascular: Normal rate and regular rhythm. Exam reveals no gallop.  Murmur heard.  Harsh midsystolic  murmur is present with a grade of 3/6 at the upper right sternal border radiating to the neck. Pulses:      Carotid pulses are 2+ on the right side with bruit and 2+ on the left side.      Dorsalis pedis pulses are 2+ on the right side and 2+ on the left side.       Posterior tibial pulses are 2+ on the right side and 2+ on the left side.  Femoral and popliteal pulse difficult to feel due to patient's body habitus.  No edema.   Pulmonary/Chest: Effort normal and breath sounds normal.  Abdominal: Soft. Bowel sounds are normal.  Obese. Pannus present  Musculoskeletal: Normal range of motion.        General: No edema.  Neurological: She is alert.  Skin: Skin is warm and dry.  Psychiatric: She has a normal mood and affect.   Radiology: No results found.  Laboratory examination:  CMP Latest Ref Rng & Units 02/09/2019 06/19/2013 06/19/2013  Glucose 70 - 99 mg/dL 127(H) - 119(H)  BUN 8 - 23 mg/dL 15 - 21  Creatinine 0.44 - 1.00 mg/dL 1.15(H) 1.20(H) 0.93  Sodium 135 - 145 mmol/L 139 - 141  Potassium 3.5 - 5.1 mmol/L 3.8 - 3.5  Chloride 98 - 111 mmol/L 102 - 105  CO2 22 - 32 mmol/L 23 - 23  Calcium 8.9 - 10.3 mg/dL 9.2 - 8.8  Total Protein 6.5 - 8.1 g/dL 6.5 - 7.0  Total Bilirubin 0.3 - 1.2 mg/dL 0.4 - <0.1(L)  Alkaline Phos 38 - 126 U/L 87 - 99  AST 15 - 41 U/L 13(L) - 19  ALT 0 - 44 U/L 12 - 19   CBC Latest Ref Rng & Units 02/09/2019 06/19/2013 06/09/2007  WBC 4.0 - 10.5 K/uL 8.5 6.3 7.2  Hemoglobin 12.0 - 15.0 g/dL 15.0 13.9 14.1  Hematocrit 36.0 - 46.0 % 46.4(H) 40.7 42.2  Platelets 150 - 400 K/uL 259 185 275   Lipid Panel  No results found for: CHOL, TRIG, HDL, CHOLHDL, VLDL, LDLCALC, LDLDIRECT HEMOGLOBIN A1C No results found for: HGBA1C, MPG TSH No results for input(s): TSH in the last 8760 hours.   Medications   There are no discontinued medications. Current Meds  Medication Sig  . albuterol (PROVENTIL HFA;VENTOLIN HFA) 108 (90 BASE) MCG/ACT inhaler Inhale 1 puff  into the lungs every 6 (six) hours as needed for wheezing or shortness of breath.  . ALPRAZolam (XANAX) 0.5 MG tablet Take 1 tablet by mouth 3 (three) times daily as needed for anxiety.   Marland Kitchen atorvastatin (LIPITOR) 10 MG tablet Take 1 tablet (10 mg total) by mouth daily.  . furosemide (LASIX) 40 MG tablet TAKE 1 TABLET BY MOUTH EVERY DAY (Patient taking differently: Take 40 mg by mouth daily. )  . metoprolol succinate (TOPROL-XL) 50 MG 24 hr tablet Take 50 mg by mouth at bedtime. Take with or immediately following a meal.  . nitroGLYCERIN (NITROSTAT) 0.4 MG SL tablet as needed.  Marland Kitchen spironolactone (ALDACTONE) 25 MG tablet TAKE 1 TABLET BY MOUTH EVERY MORNING (Patient taking differently: Take 25 mg by mouth daily. )  . Venlafaxine HCl 75 MG TB24 Take 1 capsule by mouth every morning.     Cardiac Studies:   Coronary angiogram 09/20/2014: No significant coronary artery disease. Left dominant circulation. Ascending aortogram normal. Anterior origin of a small RCA. LV not evaluated due to inability to cross AV  Echo 07/10/2018:  Left ventricle: There was mild concentric hypertrophy. The estimated ejection fraction was in the range of 40% to 45%. Mild diffuse hypokinesis. Although no diagnostic regional wall motion abnormality was identified, this possibility cannot be completely excluded on the basis of this study. Features are consistent with a pseudonormal left ventricular filling pattern, with concomitant abnormal relaxation and increased filling pressure (grade 2 diastolic dysfunction). - Ventricular septum: These changes are consistent with a left bundle branch block. - Aortic valve: Mildly calcified annulus. Probably trileaflet; mildly calcified leaflets. There was mild stenosis. Mean gradient (S): 11 mm Hg. Peak gradient (S): 24 mm Hg. Valve area (VTI): 2.9 cm^2. Valve area (Vmax): 2.63 cm^2. Valve area (Vmean): 2.72 cm^2. - Mitral valve: Calcified annulus. Valve area by pressure  half-time: 2.2 cm^2. - Left atrium: The atrium was moderately dilated. Compared to echocardiogram 04/25/2016 and 06/06/2015, previously EF was reported to be 30 to 35%.   Assessment     ICD-10-CM   1. Paroxysmal atrial fibrillation (  HCC)  I48.0 rivaroxaban (XARELTO) 15 MG TABS tablet   CHA2DS2-VASc Score is 4. Yearly risk of stroke 4   2. Mild hyperlipidemia  E78.5 Lipid Panel With LDL/HDL Ratio  3. Essential hypertension  I10 CBC  4. Chronic combined systolic and diastolic heart failure (HCC)  I50.42   5. LBBB (left bundle branch block)  I44.7   6. Class 3 severe obesity due to excess calories with serious comorbidity and body mass index (BMI) of 50.0 to 59.9 in adult [facility])  E66.01    Z68.43     Orders Placed This Encounter  Procedures  . CBC  . Lipid Panel With LDL/HDL Ratio   EMS telemetry strips from 02/09/2019: Atrial fibrillation with rapid ventricular spots at the rate of 160 bpm.  EKG 02/09/2019 in the ED: Normal sinus rhythm with rate of 75 bpm, left bundle branch block.  No further analysis.  No change from previous.  Recommendations:   Patient clearly has high cardioembolic risk factors for A. fib, she is back in sinus rhythm, she has an appointment to see his back sometime next month, she will keep appointment.  Started her on Xarelto 15 mg after dinner in view of stage III chronic kidney disease.  Blood pressure is well controlled.  Weight loss regarding risk of A. fib discussed with the patient.  She is presently asymptomatic and back to baseline except for chronic dyspnea.  We will obtain CBC and lipid profile testing prior to her next office visit and see her back then.  I may consider repeating echocardiogram in view of new A. fib.  Doubt ischemia as there was no EKG abnormality although LBBB makes it difficult.  "Total time spent with patient was  40 minutes and greater than 50% of that time was spent in face to face discussion, counseling and coordination care"  Adrian Prows, MD, [facility] 02/12/2019, 12:51 PM [facility] [place] Pager: [phone] Office: [phone] If no answer Cell [phone]

## 2019-03-05 ENCOUNTER — Emergency Department (HOSPITAL_COMMUNITY)
Admission: EM | Admit: 2019-03-05 | Discharge: 2019-03-05 | Disposition: A | Discharge status: Home / Self Care | Payer: Medicare HMO | Attending: Emergency Medicine | Admitting: Emergency Medicine

## 2019-03-05 ENCOUNTER — Emergency Department (HOSPITAL_COMMUNITY): Payer: Medicare HMO

## 2019-03-05 ENCOUNTER — Other Ambulatory Visit: Payer: Self-pay

## 2019-03-05 ENCOUNTER — Encounter (HOSPITAL_COMMUNITY): Payer: Self-pay | Admitting: Emergency Medicine

## 2019-03-05 DIAGNOSIS — R0602 Shortness of breath: Secondary | ICD-10-CM | POA: Diagnosis present

## 2019-03-05 DIAGNOSIS — Z87891 Personal history of nicotine dependence: Secondary | ICD-10-CM | POA: Insufficient documentation

## 2019-03-05 DIAGNOSIS — Z7901 Long term (current) use of anticoagulants: Secondary | ICD-10-CM | POA: Diagnosis not present

## 2019-03-05 DIAGNOSIS — I4891 Unspecified atrial fibrillation: Secondary | ICD-10-CM | POA: Diagnosis not present

## 2019-03-05 LAB — CBC WITH DIFFERENTIAL/PLATELET
Abs Immature Granulocytes: 0.02 10*3/uL (ref 0.00–0.07)
Basophils Absolute: 0.1 10*3/uL (ref 0.0–0.1)
Basophils Relative: 1 %
Eosinophils Absolute: 0.3 10*3/uL (ref 0.0–0.5)
Eosinophils Relative: 4 %
HCT: 46.9 % — ABNORMAL HIGH (ref 36.0–46.0)
Hemoglobin: 14.7 g/dL (ref 12.0–15.0)
Immature Granulocytes: 0 %
Lymphocytes Relative: 17 %
Lymphs Abs: 1.3 10*3/uL (ref 0.7–4.0)
MCH: 28.6 pg (ref 26.0–34.0)
MCHC: 31.3 g/dL (ref 30.0–36.0)
MCV: 91.2 fL (ref 80.0–100.0)
Monocytes Absolute: 0.6 10*3/uL (ref 0.1–1.0)
Monocytes Relative: 8 %
Neutro Abs: 5.6 10*3/uL (ref 1.7–7.7)
Neutrophils Relative %: 70 %
Platelets: 282 10*3/uL (ref 150–400)
RBC: 5.14 MIL/uL — ABNORMAL HIGH (ref 3.87–5.11)
RDW: 14.5 % (ref 11.5–15.5)
WBC: 7.9 10*3/uL (ref 4.0–10.5)
nRBC: 0 % (ref 0.0–0.2)

## 2019-03-05 LAB — BASIC METABOLIC PANEL
Anion gap: 10 (ref 5–15)
BUN: 18 mg/dL (ref 8–23)
CO2: 29 mmol/L (ref 22–32)
Calcium: 9 mg/dL (ref 8.9–10.3)
Chloride: 100 mmol/L (ref 98–111)
Creatinine, Ser: 1.13 mg/dL — ABNORMAL HIGH (ref 0.44–1.00)
GFR calc Af Amer: 58 mL/min — ABNORMAL LOW (ref >60)
GFR calc non Af Amer: 50 mL/min — ABNORMAL LOW (ref >60)
Glucose, Bld: 98 mg/dL (ref 70–99)
Potassium: 4.4 mmol/L (ref 3.5–5.1)
Sodium: 139 mmol/L (ref 135–145)

## 2019-03-05 LAB — TSH: TSH: 5.081 u[IU]/mL — ABNORMAL HIGH (ref 0.350–4.500)

## 2019-03-05 MED ORDER — METOPROLOL SUCCINATE ER 100 MG PO TB24: 50.0000 mg | ORAL_TABLET | Freq: Two times a day (BID) | ORAL | 0 refills | Status: DC

## 2019-03-05 MED ORDER — DILTIAZEM HCL 25 MG/5ML IV SOLN
20.0000 mg | Freq: Once | INTRAVENOUS | Status: DC
  Filled 2019-03-05: qty 5

## 2019-03-05 MED ORDER — METOPROLOL SUCCINATE ER 100 MG PO TB24: 100.0000 mg | ORAL_TABLET | Freq: Every day | ORAL | 0 refills | Status: DC

## 2019-03-05 MED ORDER — METOPROLOL SUCCINATE ER 50 MG PO TB24: 50.0000 mg | ORAL_TABLET | Freq: Two times a day (BID) | ORAL | 0 refills | Status: DC

## 2019-03-05 NOTE — Discharge Instructions (Signed)
Please call Dr. Einar Gip on Monday and set up an appointment to see him in the office.  He would like you to double your metoprolol, he wants you to take it twice a day.

## 2019-03-05 NOTE — ED Provider Notes (Signed)
[facility] EMERGENCY DEPARTMENT Provider Note   CSN: [id number] Arrival date & time: 03/05/19  1333    History   Chief Complaint Chief Complaint  Patient presents with  . Shortness of Breath    HPI Lorraine Harris is a 68 y.o. female.     68 year old female with a chief complaint of shortness of breath.  Really noticed that this morning while she was doing laundry.  Had to stop and take a rest and ended up calling a family member who came over and performed an EKG on her.  He felt that this looks strange and she had to come to the hospital for evaluation.  Patient has felt bad for the past couple days.  Unable to put her finger on it.  No specific feeling of palpations.  She was recently seen in the emergency departments about 3 to 4 weeks ago and was diagnosed with new onset atrial fibrillation.  This converted spontaneously on arrival to the ED.  She is seen her cardiologist and was started on Xarelto.  She is on metoprolol which she has been taking regularly. Of note the patient is unable to tell me if she is in atrial fibrillation currently or not.  The history is provided by the patient.  Shortness of Breath Severity:  Moderate Onset quality:  Gradual Duration:  3 days Timing:  Constant Progression:  Worsening Chronicity:  New Relieved by:  Nothing Worsened by:  Nothing Ineffective treatments:  None tried Associated symptoms: no chest pain, no fever, no headaches, no vomiting and no wheezing     Past Medical History:  Diagnosis Date  . Asthma   . CHF (congestive heart failure) ([place])   . Hyperlipidemia   . Hypertension     Patient Active Problem List   Diagnosis Date Noted  . Chronic combined systolic and diastolic heart failure ([place]) 12/17/2018  . Mixed hyperlipidemia 12/17/2018  . Dilated cardiomyopathy ([place]) 09/20/2014  . LBBB (left bundle branch block) 09/20/2014  . AORTIC VALVE DISORDERS 03/21/2008  . OTITIS EXTERNA, ACUTE, LEFT  03/14/2008  . COLONIC POLYPS, BENIGN 07/09/2007  . Morbid obesity ([place]) 07/09/2007  . DEPRESSION 07/09/2007  . MIGRAINE HEADACHE 07/09/2007  . Essential hypertension 07/09/2007  . ESOPHAGEAL STRICTURE 05/11/2001    Past Surgical History:  Procedure Laterality Date  . LEFT HEART CATHETERIZATION WITH CORONARY ANGIOGRAM N/A 09/20/2014   Procedure: LEFT HEART CATHETERIZATION WITH CORONARY ANGIOGRAM;  Surgeon: Laverda Page, MD;  Location: [facility] CATH LAB;  Service: Cardiovascular;  Laterality: N/A;     OB History   No obstetric history on file.      Home Medications    Prior to Admission medications   Medication Sig Start Date End Date Taking? Authorizing Provider  albuterol (PROVENTIL HFA;VENTOLIN HFA) 108 (90 BASE) MCG/ACT inhaler Inhale 1 puff into the lungs every 6 (six) hours as needed for wheezing or shortness of breath.    [provider]  ALPRAZolam Duanne Moron) 1 MG tablet Take 1 mg by mouth 3 (three) times daily as needed for anxiety.  06/17/13   [provider]  atorvastatin (LIPITOR) 10 MG tablet Take 1 tablet (10 mg total) by mouth daily. 12/16/18   Miquel Dunn, NP  furosemide (LASIX) 40 MG tablet TAKE 1 TABLET BY MOUTH EVERY DAY Patient taking differently: Take 40 mg by mouth daily.  12/16/18   Miquel Dunn, NP  metoprolol succinate (TOPROL-XL) 50 MG 24 hr tablet Take 50 mg by mouth at bedtime.  Take with or immediately following a meal.    [provider]  nitroGLYCERIN (NITROSTAT) 0.4 MG SL tablet as needed. 08/12/18   [provider]  rivaroxaban (XARELTO) 15 MG TABS tablet Take 1 tablet (15 mg total) by mouth daily with supper. 02/12/19   Adrian Prows, MD  spironolactone (ALDACTONE) 25 MG tablet TAKE 1 TABLET BY MOUTH EVERY MORNING Patient taking differently: Take 25 mg by mouth daily.  01/13/19   Miquel Dunn, NP  Venlafaxine HCl 75 MG TB24 Take 1 capsule by mouth every morning.  06/01/13   [provider]     Family History Family History  Problem Relation Age of Onset  . Kidney disease Father   . Heart attack Father   . Heart disease Sister     Social History Social History   Tobacco Use  . Smoking status: Former Smoker    Quit date: 2012    Years since quitting: 8.5  . Smokeless tobacco: Never Used  Substance Use Topics  . Alcohol use: No  . Drug use: No     Allergies   Morphine and related, Valium [diazepam], and Latex   Review of Systems Review of Systems  Constitutional: Negative for chills and fever.  HENT: Negative for congestion and rhinorrhea.   Eyes: Negative for redness and visual disturbance.  Respiratory: Positive for shortness of breath. Negative for wheezing.   Cardiovascular: Negative for chest pain and palpitations.  Gastrointestinal: Negative for nausea and vomiting.  Genitourinary: Negative for dysuria and urgency.  Musculoskeletal: Negative for arthralgias and myalgias.  Skin: Negative for pallor and wound.  Neurological: Negative for dizziness and headaches.     Physical Exam Updated Vital Signs BP (!) 135/93   Pulse (!) 45   Temp 98 F (36.7 C) (Oral)   Resp (!) 22   Ht 5\' 3"  (1.6 m)   Wt 136.1 kg   SpO2 93%   BMI 53.14 kg/m   Physical Exam Vitals signs and nursing note reviewed.  Constitutional:      General: She is not in acute distress.    Appearance: She is well-developed. She is morbidly obese. She is not diaphoretic.  HENT:     Head: Normocephalic and atraumatic.  Eyes:     Pupils: Pupils are equal, round, and reactive to light.  Neck:     Musculoskeletal: Normal range of motion and neck supple.  Cardiovascular:     Rate and Rhythm: Tachycardia present. Rhythm irregular.     Heart sounds: No murmur. No friction rub. No gallop.   Pulmonary:     Effort: Pulmonary effort is normal.     Breath sounds: No wheezing or rales.  Abdominal:     General: There is no distension.     Palpations: Abdomen is soft.     Tenderness:  There is no abdominal tenderness.  Musculoskeletal:        General: No tenderness.  Skin:    General: Skin is warm and dry.  Neurological:     Mental Status: She is alert and oriented to person, place, and time.  Psychiatric:        Behavior: Behavior normal.      ED Treatments / Results  Labs (all labs ordered are listed, but only abnormal results are displayed) Labs Reviewed  CBC WITH DIFFERENTIAL/PLATELET  BASIC METABOLIC PANEL  TSH    EKG EKG Interpretation  Date/Time:  Friday March 05 2019 13:38:06 EDT Ventricular Rate:  138 PR Interval:  QRS Duration: 146 QT Interval:  311 QTC Calculation: 472 R Axis:   -27 Text Interpretation:  Atrial fibrillation Left bundle branch block Since last tracing rate faster Otherwise no significant change Confirmed by Deno Etienne [phone]) on 03/05/2019 1:49:14 PM   Radiology Dg Chest Port 1 View  Result Date: 03/05/2019 CLINICAL DATA:  Patient with tachycardia EXAM: PORTABLE CHEST 1 VIEW COMPARISON:  Chest radiograph 02/09/2019 FINDINGS: Monitoring leads overlie the patient. Stable cardiomegaly. Bibasilar heterogeneous pulmonary opacities. No pleural effusion or pneumothorax. IMPRESSION: Cardiomegaly. Bibasilar opacities favored to represent atelectasis. Electronically Signed   By: Lovey Newcomer M.D.   On: 03/05/2019 14:42    Procedures Procedures (including critical care time)  Medications Ordered in ED Medications  diltiazem (CARDIZEM) injection 20 mg (20 mg Intravenous Not Given 03/05/19 1410)     Initial Impression / Assessment and Plan / ED Course  I have reviewed the triage vital signs and the nursing notes.  Pertinent labs & imaging results that were available during my care of the patient were reviewed by me and considered in my medical decision making (see chart for details).        68 yo F with a chief complaint of shortness of breath.  The patient has been fatigued for the past 2 to 3 days.  Has had to stop and rest  multiple times throughout the day.  Was recently in a ED about 3 weeks ago and diagnosed with atrial fibrillation.  Had seen her cardiologist about 3 days after that and started on Xarelto.  I called Dr. Einar Gip, cardiology to discuss electrical cardioversion and is those discussing the case with him she spontaneously converted.  He recommended observing her in the ED for about an hour and then discharging her home on 50 mg of metoprolol XL twice daily.  CRITICAL CARE Performed by: Cecilio Asper   Total critical care time: 35 minutes  Critical care time was exclusive of separately billable procedures and treating other patients.  Critical care was necessary to treat or prevent imminent or life-threatening deterioration.  Critical care was time spent personally by me on the following activities: development of treatment plan with patient and/or surrogate as well as nursing, discussions with consultants, evaluation of patient's response to treatment, examination of patient, obtaining history from patient or surrogate, ordering and performing treatments and interventions, ordering and review of laboratory studies, ordering and review of radiographic studies, pulse oximetry and re-evaluation of patient's condition.   CHA2DS2/VAS Stroke Risk Points  Current as of 4 minutes ago     4 >= 2 Points: High Risk  1 - 1.99 Points: Medium Risk  0 Points: Low Risk    The patient's score has not changed in the past year.: No Change     Details    This score determines the patient's risk of having a stroke if the  patient has atrial fibrillation.       Points Metrics  1 Has Congestive Heart Failure:  Yes    Current as of 4 minutes ago  0 Has Vascular Disease:  No    Current as of 4 minutes ago  1 Has Hypertension:  Yes    Current as of 4 minutes ago  1 Age:  18    Current as of 4 minutes ago  0 Has Diabetes:  No    Current as of 4 minutes ago  0 Had Stroke:  No  Had TIA:  No  Had  thromboembolism:  No  Current as of 4 minutes ago  1 Female:  Yes    Current as of 4 minutes ago     Patient care signed out to Dr. Laverta Baltimore. Please see their note for further details of care in the ED.   The patients results and plan were reviewed and discussed.   Any x-rays performed were independently reviewed by myself.   Differential diagnosis were considered with the presenting HPI.  Medications  diltiazem (CARDIZEM) injection 20 mg (20 mg Intravenous Not Given 03/05/19 1410)    Vitals:   03/05/19 1339 03/05/19 1340 03/05/19 1345 03/05/19 1400  BP: 99/81  (!) 116/94 (!) 135/93  Pulse: (!) 125  (!) 128 (!) 45  Resp: 18  20 (!) 22  Temp: 98 F (36.7 C)     TempSrc: Oral     SpO2: 97%  96% 93%  Weight:  136.1 kg    Height:  5\' 3"  (1.6 m)      Final diagnoses:  Atrial fibrillation with rapid ventricular response (HCC)    Admission/ observation were discussed with the admitting physician, patient and/or family and they are comfortable with the plan.         Final Clinical Impressions(s) / ED Diagnoses   Final diagnoses:  Atrial fibrillation with rapid ventricular response [facility])    ED Discharge Orders    None       Deno Etienne, DO 03/05/19 1503

## 2019-03-05 NOTE — ED Triage Notes (Signed)
Pt arrives via EMS from home with reports of SOB, weakness, jaw pain. Pt took Xanax at 11:30 thinking it was a panic attack. EMS reports A-fib RVR 150-170, 97%. 5 mg metoprolol given. Pt diagnosed with A-fib last month.

## 2019-03-05 NOTE — ED Provider Notes (Signed)
Blood pressure (!) 135/93, pulse (!) 45, temperature 98 F (36.7 C), temperature source Oral, resp. rate (!) 22, height 5\' 3"  (1.6 m), weight 136.1 kg, SpO2 93 %.  Assuming care from Dr. Tyrone Nine.  In short, DELORES EDELSTEIN is a 68 y.o. female with a chief complaint of Shortness of Breath .  Refer to the original H&P for additional details.  The current plan of care is to f/u on labs and reassess.   EKG Interpretation  Date/Time:  Friday March 05 2019 13:38:06 EDT Ventricular Rate:  138 PR Interval:    QRS Duration: 146 QT Interval:  311 QTC Calculation: 472 R Axis:   -27 Text Interpretation:  Atrial fibrillation Left bundle branch block Since last tracing rate faster Otherwise no significant change Confirmed by Deno Etienne [phone]) on 03/05/2019 1:49:14 PM       04:00 PM  Patient remains in sinus rhythm on monitor.  Lab work reviewed.  TSH is elevated and will require additional follow-up with PCP for additional thyroid studies.  No electrolyte abnormalities.  Patient feeling very well.  She plans to call her cardiologist on Monday.  I have made the adjustments to the patient's metoprolol as requested by the cardiologist.  Discussed ED return precautions.   Margette Fast, MD 03/05/19 [phone]

## 2019-03-05 NOTE — ED Notes (Signed)
Pt verbalized understanding of discharge paperwork, prescriptions and follow-up care. Grandson to pick up pt.

## 2019-03-10 LAB — LIPID PANEL WITH LDL/HDL RATIO
Cholesterol, Total: 126 mg/dL (ref 100–199)
HDL: 40 mg/dL (ref >39)
LDL Calculated: 65 mg/dL (ref 0–99)
LDl/HDL Ratio: 1.6 ratio (ref 0.0–3.2)
Triglycerides: 105 mg/dL (ref 0–149)
VLDL Cholesterol Cal: 21 mg/dL (ref 5–40)

## 2019-03-10 LAB — CBC
Hematocrit: 42.8 % (ref 34.0–46.6)
Hemoglobin: 13.4 g/dL (ref 11.1–15.9)
MCH: 28.4 pg (ref 26.6–33.0)
MCHC: 31.3 g/dL — ABNORMAL LOW (ref 31.5–35.7)
MCV: 91 fL (ref 79–97)
Platelets: 273 10*3/uL (ref 150–450)
RBC: 4.72 x10E6/uL (ref 3.77–5.28)
RDW: 14.9 % (ref 11.7–15.4)
WBC: 7.2 10*3/uL (ref 3.4–10.8)

## 2019-03-11 ENCOUNTER — Encounter: Payer: Self-pay | Admitting: Cardiology

## 2019-03-12 ENCOUNTER — Ambulatory Visit (INDEPENDENT_AMBULATORY_CARE_PROVIDER_SITE_OTHER): Payer: Medicare HMO | Admitting: Cardiology

## 2019-03-12 ENCOUNTER — Encounter: Payer: Self-pay | Admitting: Cardiology

## 2019-03-12 ENCOUNTER — Other Ambulatory Visit: Payer: Self-pay

## 2019-03-12 VITALS — BP 140/69 | HR 54 | Ht 63.0 in | Wt 338.0 lb

## 2019-03-12 DIAGNOSIS — F419 Anxiety disorder, unspecified: Secondary | ICD-10-CM

## 2019-03-12 DIAGNOSIS — I5042 Chronic combined systolic (congestive) and diastolic (congestive) heart failure: Secondary | ICD-10-CM

## 2019-03-12 DIAGNOSIS — R5381 Other malaise: Secondary | ICD-10-CM | POA: Diagnosis not present

## 2019-03-12 DIAGNOSIS — R0683 Snoring: Secondary | ICD-10-CM | POA: Diagnosis not present

## 2019-03-12 DIAGNOSIS — F329 Major depressive disorder, single episode, unspecified: Secondary | ICD-10-CM

## 2019-03-12 DIAGNOSIS — R7989 Other specified abnormal findings of blood chemistry: Secondary | ICD-10-CM

## 2019-03-12 DIAGNOSIS — I480 Paroxysmal atrial fibrillation: Secondary | ICD-10-CM | POA: Diagnosis not present

## 2019-03-12 DIAGNOSIS — R5383 Other fatigue: Secondary | ICD-10-CM

## 2019-03-12 MED ORDER — METOPROLOL SUCCINATE ER 50 MG PO TB24: 50.0000 mg | ORAL_TABLET | Freq: Two times a day (BID) | ORAL | 1 refills | Status: DC

## 2019-03-12 NOTE — Progress Notes (Signed)
Primary Physician/Referring:  Everardo Beals, NP  Patient ID: Lorraine Harris, female    DOB: [birth year], 68 y.o.   MRN: [id number]  Chief Complaint  Patient presents with  . Hypertension  . Follow-up  . Atrial Fibrillation    HPI: Lorraine Harris  is a 68 y.o. female  with  morbid obesity, H/O non ischemic cardiomyopathy with normal coronary arteries by angio on 09/20/2014. HTN, bronchial asthma, and depression. She also was found to have left bundle branch block at that time. She has chronic shortness breath and dyspnea on exertion due to obesity and also prior history of tobacco use which she has quit.   She was seen in the emergency room on 02/09/2019 with chest tightness jaw pain suggestive of angina pectoris that she has always complained off and marked dyspnea and EKG by the EMS had revealed A. fib with RVR, wide-complex rhythm. She was started on Xarelto. She again presented to ER on 03/05/2019 not feeling well and short of breath. Noted again to be in A fib with RVR. TSH also elevated. Metoprolol was increased to 50 mg. She now presents for hospital follow up.   She is presently doing well, still in bereavement after recent mother's death and is tearful today. She does mention occasional episodes that she feels is related to anxiety, where she feels like her heart is racing, but is normal rate and blood pressure may be elevated. States that she takes Xanax that helps. She does continue to have fatigue.  Otherwise states that she has not had any further jaw pain, dyspnea is remained stable, no PND orthopnea, no palpitations.  Past Medical History:  Diagnosis Date  . A-fib ([place])   . A-fib ([place])   . Asthma   . CHF (congestive heart failure) ([place])   . Hyperlipidemia   . Hypertension     Past Surgical History:  Procedure Laterality Date  . LEFT HEART CATHETERIZATION WITH CORONARY ANGIOGRAM N/A 09/20/2014   Procedure: LEFT HEART CATHETERIZATION WITH CORONARY ANGIOGRAM;   Surgeon: Laverda Page, MD;  Location: [facility] CATH LAB;  Service: Cardiovascular;  Laterality: N/A;    Social History   Socioeconomic History  . Marital status: Divorced    Spouse name: Not on file  . Number of children: 1  . Years of education: Not on file  . Highest education level: Not on file  Occupational History  . Not on file  Social Needs  . Financial resource strain: Not on file  . Food insecurity    Worry: Not on file    Inability: Not on file  . Transportation needs    Medical: Not on file    Non-medical: Not on file  Tobacco Use  . Smoking status: Former Smoker    Quit date: 2012    Years since quitting: 8.5  . Smokeless tobacco: Never Used  Substance and Sexual Activity  . Alcohol use: No  . Drug use: No  . Sexual activity: Not on file  Lifestyle  . Physical activity    Days per week: Not on file    Minutes per session: Not on file  . Stress: Not on file  Relationships  . Social Herbalist on phone: Not on file    Gets together: Not on file    Attends religious service: Not on file    Active member of club or organization: Not on file    Attends meetings of clubs or organizations: Not on  file    Relationship status: Not on file  . Intimate partner violence    Fear of current or ex partner: Not on file    Emotionally abused: Not on file    Physically abused: Not on file    Forced sexual activity: Not on file  Other Topics Concern  . Not on file  Social History Narrative  . Not on file   Review of Systems  Constitution: Positive for malaise/fatigue. Negative for chills, decreased appetite and weight gain.  Cardiovascular: Positive for dyspnea on exertion. Negative for chest pain, leg swelling, palpitations and syncope.  Respiratory: Positive for snoring. Negative for cough.   Endocrine: Negative for cold intolerance.  Hematologic/Lymphatic: Does not bruise/bleed easily.  Musculoskeletal: Positive for back pain. Negative for joint  swelling.  Gastrointestinal: Negative for abdominal pain, anorexia, change in bowel habit, hematochezia and melena.  Neurological: Negative for dizziness, focal weakness, headaches and light-headedness.  Psychiatric/Behavioral: Positive for depression. Negative for substance abuse. The patient is nervous/anxious.   All other systems reviewed and are negative.     Objective  Blood pressure 140/69, pulse (!) 54, height 5\' 3"  (1.6 m), weight (!) 338 lb (153.3 kg), SpO2 92 %. Body mass index is 59.87 kg/m.     Physical Exam  Constitutional: She appears well-developed. No distress.  Morbidly obese  HENT:  Head: Atraumatic.  Eyes: Conjunctivae are normal.  Neck: Neck supple. No thyromegaly present.  Short neck and difficult to evaluate JVP  Cardiovascular: Normal rate and regular rhythm. Exam reveals no gallop.  Murmur heard.  Harsh midsystolic murmur is present with a grade of 3/6 at the upper right sternal border radiating to the neck. Pulses:      Carotid pulses are 2+ on the right side with bruit and 2+ on the left side.      Dorsalis pedis pulses are 2+ on the right side and 2+ on the left side.       Posterior tibial pulses are 2+ on the right side and 2+ on the left side.  Femoral and popliteal pulse difficult to feel due to patient's body habitus.  No edema.   Pulmonary/Chest: Effort normal and breath sounds normal.  Abdominal: Soft. Bowel sounds are normal.  Obese. Pannus present  Musculoskeletal: Normal range of motion.        General: No edema.  Neurological: She is alert.  Skin: Skin is warm and dry.  Psychiatric: She has a normal mood and affect.   Radiology: No results found.  Laboratory examination:    CMP Latest Ref Rng & Units 03/05/2019 02/09/2019 06/19/2013  Glucose 70 - 99 mg/dL 98 127(H) -  BUN 8 - 23 mg/dL 18 15 -  Creatinine 0.44 - 1.00 mg/dL 1.13(H) 1.15(H) 1.20(H)  Sodium 135 - 145 mmol/L 139 139 -  Potassium 3.5 - 5.1 mmol/L 4.4 3.8 -  Chloride 98 -  111 mmol/L 100 102 -  CO2 22 - 32 mmol/L 29 23 -  Calcium 8.9 - 10.3 mg/dL 9.0 9.2 -  Total Protein 6.5 - 8.1 g/dL - 6.5 -  Total Bilirubin 0.3 - 1.2 mg/dL - 0.4 -  Alkaline Phos 38 - 126 U/L - 87 -  AST 15 - 41 U/L - 13(L) -  ALT 0 - 44 U/L - 12 -   CBC Latest Ref Rng & Units 03/08/2019 03/05/2019 02/09/2019  WBC 3.4 - 10.8 x10E3/uL 7.2 7.9 8.5  Hemoglobin 11.1 - 15.9 g/dL 13.4 14.7 15.0  Hematocrit 34.0 -  46.6 % 42.8 46.9(H) 46.4(H)  Platelets 150 - 450 x10E3/uL 273 282 259   Lipid Panel     Component Value Date/Time   CHOL 126 03/08/2019 1052   TRIG 105 03/08/2019 1052   HDL 40 03/08/2019 1052   LDLCALC 65 03/08/2019 1052   HEMOGLOBIN A1C No results found for: HGBA1C, MPG TSH Recent Labs    03/05/19 1400  TSH 5.081*     Medications   Medications Discontinued During This Encounter  Medication Reason  . metoprolol succinate (TOPROL-XL) 50 MG 24 hr tablet Reorder   Current Meds  Medication Sig  . albuterol (PROVENTIL HFA;VENTOLIN HFA) 108 (90 BASE) MCG/ACT inhaler Inhale 1 puff into the lungs every 6 (six) hours as needed for wheezing or shortness of breath.  . ALPRAZolam (XANAX) 0.5 MG tablet Take 0.25-0.5 mg by mouth daily as needed for anxiety.   Marland Kitchen atorvastatin (LIPITOR) 10 MG tablet Take 1 tablet (10 mg total) by mouth daily.  . furosemide (LASIX) 40 MG tablet TAKE 1 TABLET BY MOUTH EVERY DAY (Patient taking differently: Take 40 mg by mouth daily. )  . metoprolol succinate (TOPROL-XL) 50 MG 24 hr tablet Take 1 tablet (50 mg total) by mouth 2 (two) times a day. Take with or immediately following a meal.  . nitroGLYCERIN (NITROSTAT) 0.4 MG SL tablet Place 0.4 mg under the tongue every 5 (five) minutes as needed for chest pain.   . rivaroxaban (XARELTO) 15 MG TABS tablet Take 1 tablet (15 mg total) by mouth daily with supper.  Marland Kitchen spironolactone (ALDACTONE) 25 MG tablet TAKE 1 TABLET BY MOUTH EVERY MORNING (Patient taking differently: Take 25 mg by mouth daily. )  .  venlafaxine (EFFEXOR) 75 MG tablet Take 75 mg by mouth at bedtime.  . [DISCONTINUED] metoprolol succinate (TOPROL-XL) 50 MG 24 hr tablet Take 1 tablet (50 mg total) by mouth 2 (two) times a day. Take with or immediately following a meal.    Cardiac Studies:   Coronary angiogram 09/20/2014: No significant coronary artery disease. Left dominant circulation. Ascending aortogram normal. Anterior origin of a small RCA. LV not evaluated due to inability to cross AV  Echo 07/10/2018:  Left ventricle: There was mild concentric hypertrophy. The estimated ejection fraction was in the range of 40% to 45%. Mild diffuse hypokinesis. Although no diagnostic regional wall motion abnormality was identified, this possibility cannot be completely excluded on the basis of this study. Features are consistent with a pseudonormal left ventricular filling pattern, with concomitant abnormal relaxation and increased filling pressure (grade 2 diastolic dysfunction). - Ventricular septum: These changes are consistent with a left bundle branch block. - Aortic valve: Mildly calcified annulus. Probably trileaflet; mildly calcified leaflets. There was mild stenosis. Mean gradient (S): 11 mm Hg. Peak gradient (S): 24 mm Hg. Valve area (VTI): 2.9 cm^2. Valve area (Vmax): 2.63 cm^2. Valve area (Vmean): 2.72 cm^2. - Mitral valve: Calcified annulus. Valve area by pressure half-time: 2.2 cm^2. - Left atrium: The atrium was moderately dilated. Compared to echocardiogram 04/25/2016 and 06/06/2015, previously EF was reported to be 30 to 35%.   Assessment     ICD-10-CM   1. Paroxysmal atrial fibrillation (HCC)  I48.0   2. Chronic combined systolic and diastolic heart failure (HCC)  I50.42 ECHOCARDIOGRAM COMPLETE    EKG 12-Lead  3. Morbid obesity ([place])  E66.01   4. Malaise and fatigue  R53.81    R53.83   5. Snoring  R06.83   6. Anxiety and depression  F41.9  F32.9   7. Elevated TSH  R79.89     Orders Placed This  Encounter  Procedures  . EKG 12-Lead  . ECHOCARDIOGRAM COMPLETE    Standing Status:   Future    Standing Expiration Date:   06/11/2020    Order Specific Question:   Where should this test be performed    Answer:   [place]    Order Specific Question:   Perflutren DEFINITY (image enhancing agent) should be administered unless hypersensitivity or allergy exist    Answer:   Administer Perflutren    Order Specific Question:   Reason for exam-Echo    Answer:   Atrial Fibrillation  427.31 / I48.91   EKG 03/12/2019: Sinus bradycardia at 53 bpm, normal axis, LBBB, no further analysis due to LBBB.   EKG 03/05/2019 in ER: A fib with RVR at 138 bpm, LBBB  EMS telemetry strips from 02/09/2019: Atrial fibrillation with rapid ventricular spots at the rate of 160 bpm.  EKG 02/09/2019 in the ED: Normal sinus rhythm with rate of 75 bpm, left bundle branch block.  No further analysis.  No change from previous.  Recommendations:   Patient again has had another episode of A. fib with RVR since last seen by [place] requiring evaluation in the emergency room; however, self converted while being evaluated.  Metoprolol was increased to 50 mg daily, would recommend continuing the same.  She is now maintaining sinus rhythm and has not had any recurrence of symptoms.  She does continue to have fatigue that I feel is likely multifactorial.  She is high risk for A. fib, would recommend continuing with Xarelto 50 mg in view of stage III chronic kidney disease.  Doubt ischemic etiology, has had normal coronary angiogram in 2016. Although has left bundle branch block, making evaluation for this by EKG difficult.  She has not had symptoms consistent with angina.  Given her new onset A. fib, we will obtain echocardiogram for further evaluation.  She has risk factors for sleep apnea, also has history of snoring, fatigue, and difficulty sleeping.  Given her new onset A. fib and her symptoms, I felt that sleep apnea should be  evaluated.  We'll place referral for this.  I also suspect her fatigue is related to continue debridement from the loss of her mother.  She is tearful today during our visit and clearly continues to suffer from anxiety and depression.  Advised her to discuss with her PCP.  She previously was undergoing bereavement counseling, and which I have encouraged her to continue with.  She was noted to have elevated TSH level follow-up in the emergency room.  I recommended she have further evaluation will.  Thyroid panel and will have this performed with her PCP.  She will need CBC and lipid testing, and will ask the PCP perform this when following up on her TSH level.  We'll request records of this be sent to [place] for evaluation.  I've again stressed the importance of weight loss in regards to A. fib in controlling her other risk factors.  I'll see her back in 6 weeks for follow-up, but encouraged her to contact me sooner if needed.   Miquel Dunn, MSN, APRN, FNP-C [facility]. [place] Office: [phone] Fax: [phone]

## 2019-03-14 ENCOUNTER — Encounter: Payer: Self-pay | Admitting: Cardiology

## 2019-03-18 ENCOUNTER — Ambulatory Visit: Payer: Self-pay | Admitting: Cardiology

## 2019-03-23 ENCOUNTER — Other Ambulatory Visit: Payer: Self-pay

## 2019-03-23 MED ORDER — ATORVASTATIN CALCIUM 10 MG PO TABS: 10.0000 mg | ORAL_TABLET | Freq: Every day | ORAL | 1 refills | Status: DC

## 2019-03-23 MED ORDER — FUROSEMIDE 40 MG PO TABS: 40.0000 mg | ORAL_TABLET | Freq: Every day | ORAL | 1 refills | Status: DC

## 2019-03-23 MED ORDER — NITROGLYCERIN 0.4 MG SL SUBL: 0.4000 mg | SUBLINGUAL_TABLET | SUBLINGUAL | 2 refills | Status: DC | PRN

## 2019-03-25 ENCOUNTER — Other Ambulatory Visit: Payer: Self-pay

## 2019-03-25 DIAGNOSIS — E782 Mixed hyperlipidemia: Secondary | ICD-10-CM

## 2019-03-25 MED ORDER — ATORVASTATIN CALCIUM 10 MG PO TABS: 10.0000 mg | ORAL_TABLET | Freq: Every day | ORAL | 1 refills | Status: DC

## 2019-03-25 MED ORDER — SPIRONOLACTONE 25 MG PO TABS: 25.0000 mg | ORAL_TABLET | Freq: Every day | ORAL | 1 refills | Status: DC

## 2019-03-25 MED ORDER — METOPROLOL SUCCINATE ER 50 MG PO TB24: 50.0000 mg | ORAL_TABLET | Freq: Two times a day (BID) | ORAL | 1 refills | Status: DC

## 2019-04-05 ENCOUNTER — Other Ambulatory Visit: Payer: Self-pay | Admitting: Cardiology

## 2019-04-05 DIAGNOSIS — E782 Mixed hyperlipidemia: Secondary | ICD-10-CM

## 2019-04-13 ENCOUNTER — Telehealth: Payer: Self-pay

## 2019-04-13 NOTE — Telephone Encounter (Signed)
As discussed. Likely had A fib RVR. We will try to move up her echo and f/u appt. Let us know if she continues to have episodes.

## 2019-04-13 NOTE — Telephone Encounter (Signed)
That's okay. Will monitor

## 2019-04-13 NOTE — Telephone Encounter (Signed)
Pt aware of no changes made today except echo appt moved up to 8/19/@3 . Office will call her to reschedule f/u appt and pt will call us if more episodes. Also she rechecked bp and hr and bp was 106 top # HR 76 so both were running normal. HR did jump back up into the 140's but returned back down to 70's per pt.//ah

## 2019-04-13 NOTE — Telephone Encounter (Signed)
Pt called in stating that she was at the grocery store and started having jaw pain , heart racing so she went home and check it and it was 149. This lasted about 30 min. She checked her bp by wrist while we were on the phone and it was 97/63 HR 87. She took a Xanax in case it was her anxiety.

## 2019-04-21 ENCOUNTER — Other Ambulatory Visit: Payer: Medicare HMO

## 2019-04-22 ENCOUNTER — Other Ambulatory Visit: Payer: Self-pay

## 2019-04-22 ENCOUNTER — Ambulatory Visit (HOSPITAL_COMMUNITY)
Admission: RE | Admit: 2019-04-22 | Discharge: 2019-04-22 | Disposition: A | Discharge status: Home / Self Care | Payer: Medicare HMO | Source: Ambulatory Visit | Attending: Cardiology | Admitting: Cardiology

## 2019-04-22 DIAGNOSIS — I4891 Unspecified atrial fibrillation: Secondary | ICD-10-CM | POA: Insufficient documentation

## 2019-04-22 DIAGNOSIS — I110 Hypertensive heart disease with heart failure: Secondary | ICD-10-CM | POA: Diagnosis present

## 2019-04-22 DIAGNOSIS — I082 Rheumatic disorders of both aortic and tricuspid valves: Secondary | ICD-10-CM | POA: Diagnosis not present

## 2019-04-22 DIAGNOSIS — I5042 Chronic combined systolic (congestive) and diastolic (congestive) heart failure: Secondary | ICD-10-CM | POA: Insufficient documentation

## 2019-04-22 DIAGNOSIS — E785 Hyperlipidemia, unspecified: Secondary | ICD-10-CM | POA: Insufficient documentation

## 2019-04-22 NOTE — Progress Notes (Signed)
  Echocardiogram 2D Echocardiogram has been performed.  Lorraine Harris 04/22/2019, 3:25 PM

## 2019-04-28 ENCOUNTER — Other Ambulatory Visit: Payer: Self-pay

## 2019-04-28 ENCOUNTER — Encounter: Payer: Self-pay | Admitting: Cardiology

## 2019-04-28 ENCOUNTER — Ambulatory Visit: Payer: Medicare HMO | Admitting: Cardiology

## 2019-04-28 VITALS — BP 115/60 | HR 53 | Temp 98.0°F | Ht 63.0 in | Wt 313.0 lb

## 2019-04-28 DIAGNOSIS — F329 Major depressive disorder, single episode, unspecified: Secondary | ICD-10-CM

## 2019-04-28 DIAGNOSIS — I10 Essential (primary) hypertension: Secondary | ICD-10-CM

## 2019-04-28 DIAGNOSIS — F419 Anxiety disorder, unspecified: Secondary | ICD-10-CM

## 2019-04-28 DIAGNOSIS — I480 Paroxysmal atrial fibrillation: Secondary | ICD-10-CM

## 2019-04-28 DIAGNOSIS — F32A Depression, unspecified: Secondary | ICD-10-CM

## 2019-04-28 DIAGNOSIS — R6884 Jaw pain: Secondary | ICD-10-CM

## 2019-04-28 DIAGNOSIS — I5042 Chronic combined systolic (congestive) and diastolic (congestive) heart failure: Secondary | ICD-10-CM | POA: Diagnosis not present

## 2019-04-28 MED ORDER — VALSARTAN 80 MG PO TABS: 80.0000 mg | ORAL_TABLET | Freq: Every day | ORAL | 1 refills | Status: DC

## 2019-04-28 NOTE — Progress Notes (Signed)
Primary Physician/Referring:  Everardo Beals, NP  Patient ID: Lorraine Harris, female    DOB: 1951-05-20, 68 y.o.   MRN: [id number]  Chief Complaint  Patient presents with  . Congestive Heart Failure    sob  . Atrial Fibrillation    HPI: Lorraine Harris  is a 68 y.o. female  with  morbid obesity, H/O non ischemic cardiomyopathy with normal coronary arteries by angio on 09/20/2014. HTN, bronchial asthma, and depression. She also was found to have left bundle branch block at that time. She has chronic shortness breath and dyspnea on exertion due to obesity and also prior history of tobacco use which she has quit.   She was seen in the emergency room on 02/09/2019 with chest tightness jaw pain suggestive of angina pectoris that she has always complained off and marked dyspnea and EKG by the EMS had revealed A. fib with RVR, wide-complex rhythm. She was started on Xarelto. She again presented to ER on 03/05/2019 not feeling well and short of breath. Noted again to be in A fib with RVR. TSH also elevated. Metoprolol was increased to 50 mg.  She was last seen 6 weeks ago, she has not had any known recurrence of A fib. She does occasionally have episodes, where she feel anxious, heart is racing, but on her machine heart rate will be normal. She does report occasional episodes of jaw pain/tightness that resolve after 30 mins.   She does continue to have fatigue.  Dyspnea is remained stable, no PND orthopnea, no palpitations. She continues to be grieving her mother's death and is having a hard time adjusting. States that she is depressed.  Past Medical History:  Diagnosis Date  . A-fib ([place])   . A-fib ([place])   . Asthma   . CHF (congestive heart failure) ([place])   . Hyperlipidemia   . Hypertension     Past Surgical History:  Procedure Laterality Date  . LEFT HEART CATHETERIZATION WITH CORONARY ANGIOGRAM N/A 09/20/2014   Procedure: LEFT HEART CATHETERIZATION WITH CORONARY ANGIOGRAM;   Surgeon: Laverda Page, MD;  Location: [facility] CATH LAB;  Service: Cardiovascular;  Laterality: N/A;    Social History   Socioeconomic History  . Marital status: Divorced    Spouse name: Not on file  . Number of children: 1  . Years of education: Not on file  . Highest education level: Not on file  Occupational History  . Not on file  Social Needs  . Financial resource strain: Not on file  . Food insecurity    Worry: Not on file    Inability: Not on file  . Transportation needs    Medical: Not on file    Non-medical: Not on file  Tobacco Use  . Smoking status: Former Smoker    Quit date: 2012    Years since quitting: 8.6  . Smokeless tobacco: Never Used  Substance and Sexual Activity  . Alcohol use: No  . Drug use: No  . Sexual activity: Not on file  Lifestyle  . Physical activity    Days per week: Not on file    Minutes per session: Not on file  . Stress: Not on file  Relationships  . Social Herbalist on phone: Not on file    Gets together: Not on file    Attends religious service: Not on file    Active member of club or organization: Not on file    Attends meetings of clubs  or organizations: Not on file    Relationship status: Not on file  . Intimate partner violence    Fear of current or ex partner: Not on file    Emotionally abused: Not on file    Physically abused: Not on file    Forced sexual activity: Not on file  Other Topics Concern  . Not on file  Social History Narrative  . Not on file   Review of Systems  Constitution: Positive for malaise/fatigue. Negative for chills, decreased appetite and weight gain.  Cardiovascular: Positive for dyspnea on exertion. Negative for chest pain, leg swelling, palpitations and syncope.  Respiratory: Positive for snoring. Negative for cough.   Endocrine: Negative for cold intolerance.  Hematologic/Lymphatic: Does not bruise/bleed easily.  Musculoskeletal: Positive for back pain. Negative for joint  swelling.  Gastrointestinal: Negative for abdominal pain, anorexia, change in bowel habit, hematochezia and melena.  Neurological: Negative for dizziness, focal weakness, headaches and light-headedness.  Psychiatric/Behavioral: Positive for depression. Negative for substance abuse. The patient is nervous/anxious.   All other systems reviewed and are negative.     Objective  Blood pressure 115/60, pulse (!) 53, temperature 98 F (36.7 C), height 5\' 3"  (1.6 m), weight (!) 313 lb (142 kg), SpO2 94 %. Body mass index is 55.45 kg/m.     Physical Exam  Constitutional: She appears well-developed. No distress.  Morbidly obese  HENT:  Head: Atraumatic.  Eyes: Conjunctivae are normal.  Neck: Neck supple. No thyromegaly present.  Short neck and difficult to evaluate JVP  Cardiovascular: Normal rate and regular rhythm. Exam reveals no gallop.  Murmur heard.  Harsh midsystolic murmur is present with a grade of 3/6 at the upper right sternal border radiating to the neck. Pulses:      Carotid pulses are 2+ on the right side with bruit and 2+ on the left side.      Dorsalis pedis pulses are 2+ on the right side and 2+ on the left side.       Posterior tibial pulses are 2+ on the right side and 2+ on the left side.  Femoral and popliteal pulse difficult to feel due to patient's body habitus.  No edema.   Pulmonary/Chest: Effort normal and breath sounds normal.  Abdominal: Soft. Bowel sounds are normal.  Obese. Pannus present  Musculoskeletal: Normal range of motion.        General: No edema.  Neurological: She is alert.  Skin: Skin is warm and dry.  Psychiatric: She has a normal mood and affect.   Radiology: No results found.  Laboratory examination:    CMP Latest Ref Rng & Units 03/05/2019 02/09/2019 06/19/2013  Glucose 70 - 99 mg/dL 98 127(H) -  BUN 8 - 23 mg/dL 18 15 -  Creatinine 0.44 - 1.00 mg/dL 1.13(H) 1.15(H) 1.20(H)  Sodium 135 - 145 mmol/L 139 139 -  Potassium 3.5 - 5.1 mmol/L  4.4 3.8 -  Chloride 98 - 111 mmol/L 100 102 -  CO2 22 - 32 mmol/L 29 23 -  Calcium 8.9 - 10.3 mg/dL 9.0 9.2 -  Total Protein 6.5 - 8.1 g/dL - 6.5 -  Total Bilirubin 0.3 - 1.2 mg/dL - 0.4 -  Alkaline Phos 38 - 126 U/L - 87 -  AST 15 - 41 U/L - 13(L) -  ALT 0 - 44 U/L - 12 -   CBC Latest Ref Rng & Units 03/08/2019 03/05/2019 02/09/2019  WBC 3.4 - 10.8 x10E3/uL 7.2 7.9 8.5  Hemoglobin 11.1 -  15.9 g/dL 13.4 14.7 15.0  Hematocrit 34.0 - 46.6 % 42.8 46.9(H) 46.4(H)  Platelets 150 - 450 x10E3/uL 273 282 259   Lipid Panel     Component Value Date/Time   CHOL 126 03/08/2019 1052   TRIG 105 03/08/2019 1052   HDL 40 03/08/2019 1052   LDLCALC 65 03/08/2019 1052   HEMOGLOBIN A1C No results found for: HGBA1C, MPG TSH Recent Labs    03/05/19 1400  TSH 5.081*     Medications   Medications Discontinued During This Encounter  Medication Reason  . rivaroxaban (XARELTO) 15 MG TABS tablet Duplicate   Current Meds  Medication Sig  . albuterol (PROVENTIL HFA;VENTOLIN HFA) 108 (90 BASE) MCG/ACT inhaler Inhale 1 puff into the lungs every 6 (six) hours as needed for wheezing or shortness of breath.  . ALPRAZolam (XANAX) 0.5 MG tablet Take 0.25-0.5 mg by mouth daily as needed for anxiety.   Marland Kitchen atorvastatin (LIPITOR) 10 MG tablet TAKE 1 TABLET BY MOUTH EVERY DAY  . furosemide (LASIX) 40 MG tablet Take 1 tablet (40 mg total) by mouth daily.  . metoprolol succinate (TOPROL-XL) 50 MG 24 hr tablet Take 1 tablet (50 mg total) by mouth 2 (two) times a day. Take with or immediately following a meal.  . nitroGLYCERIN (NITROSTAT) 0.4 MG SL tablet Place 1 tablet (0.4 mg total) under the tongue every 5 (five) minutes as needed for chest pain.  . Rivaroxaban (XARELTO) 15 MG TABS tablet Take 15 mg by mouth daily.  Marland Kitchen spironolactone (ALDACTONE) 25 MG tablet Take 1 tablet (25 mg total) by mouth daily.  Marland Kitchen venlafaxine (EFFEXOR) 75 MG tablet Take 75 mg by mouth at bedtime.    Cardiac Studies:   Coronary angiogram  09/20/2014: No significant coronary artery disease. Left dominant circulation. Ascending aortogram normal. Anterior origin of a small RCA. LV not evaluated due to inability to cross AV  Echo 04/22/2019:  1. The left ventricle has moderately reduced systolic function, with an ejection fraction of 35-40%. The cavity size was normal. Left ventricular diastolic Doppler parameters are consistent with impaired relaxation.  2. Right atrial size was mildly dilated.  3. The aortic valve is grossly normal. Mild-moderate stenosis of the aortic valve. Moderate aortic annular calcification noted.  4. The mitral valve was not well visualized. There is moderate mitral annular calcification present. No evidence of mitral valve stenosis.  5. The aorta is normal unless otherwise noted.  6. The aortic root and ascending aorta are normal in size and structure.  7. Technically difficult echo with poor image quality.  8. The atrial septum is grossly normal.   Assessment     ICD-10-CM   1. Chronic combined systolic and diastolic heart failure (HCC)  I50.42 PCV MYOCARDIAL PERFUSION WITH LEXISCAN  2. Paroxysmal atrial fibrillation (HCC)  I48.0   3. Essential hypertension  99991111 Basic metabolic panel  4. Jaw pain  R68.84   5. Morbid obesity ([place])  E66.01   6. Anxiety and depression  F41.9    F32.9     Orders Placed This Encounter  Procedures  . Basic metabolic panel    Standing Status:   Future    Standing Expiration Date:   04/27/2020  . PCV MYOCARDIAL PERFUSION WITH LEXISCAN    2 day protocol    Standing Status:   Future    Standing Expiration Date:   07/28/2020    Order Specific Question:   Who will be the designated reader for this study?    Answer:  Adrian Prows   EKG 03/12/2019: Sinus bradycardia at 53 bpm, normal axis, LBBB, no further analysis due to LBBB.  EKG 03/05/2019 in ER: A fib with RVR at 138 bpm, LBBB  EMS telemetry strips from 02/09/2019: Atrial fibrillation with rapid ventricular spots at  the rate of 160 bpm.  EKG 02/09/2019 in the ED: Normal sinus rhythm with rate of 75 bpm, left bundle branch block.  No further analysis.  No change from previous.  Recommendations:   Patient is here for 6-week follow-up visit for paroxysmal atrial fibrillation and to discuss recent echocardiogram results.  Echocardiogram shows continued moderately depressed LVEF of 35 to 40%, no significant changes compared to previous echocardiogram.  She had previously tried Brent; however, due to cost was unable to continue with this.  She is currently not on ACE inhibitor or oral therapy, will start valsartan 80 mg daily.  Will need BMP in 2 weeks for surveillance.  She has not had any known recurrence of atrial fibrillation.  We will continue with metoprolol.  I continue to feel that her episodes of heart racing, but has noted heart rate at that time are related to anxiety.  She is currently suffering from worsening depression and anxiety related to grieving for her mother's death.  I have encouraged her to discuss with her PCP regarding further management.  She is tearful in conversation today.  She has had occasional episodes of jaw pain resolved with nitroglycerin.  His previously had negative coronary angiogram in 2016, but given new onset A. fib, and concerning symptoms, would recommend further evaluation with stress testing.  Will obtain Lexiscan nuclear stress test.  Has left bundle branch block on EKG making assessment difficult.  Blood pressure remains well controlled.  She has been trying to make diet changes and being more active, but depression is hindering her on her activity.  She has lost 25 pounds since last seen by me, encouraged her to continue with this.  Advised her to try walking several times throughout the day if only just for a few minutes.  I will see her back after her stress test in 4 weeks for follow-up.   Miquel Dunn, MSN, APRN, FNP-C [facility]. [place] Office:  [phone] Fax: [phone]

## 2019-05-03 ENCOUNTER — Encounter: Payer: Self-pay | Admitting: Cardiology

## 2019-05-04 ENCOUNTER — Other Ambulatory Visit: Payer: Medicare HMO

## 2019-05-07 ENCOUNTER — Ambulatory Visit: Payer: Medicare HMO | Admitting: Cardiology

## 2019-05-28 ENCOUNTER — Other Ambulatory Visit: Payer: Self-pay | Admitting: Cardiology

## 2019-05-29 LAB — BASIC METABOLIC PANEL
BUN/Creatinine Ratio: 17 (ref 12–28)
BUN: 18 mg/dL (ref 8–27)
CO2: 24 mmol/L (ref 20–29)
Calcium: 8.9 mg/dL (ref 8.7–10.3)
Chloride: 100 mmol/L (ref 96–106)
Creatinine, Ser: 1.03 mg/dL — ABNORMAL HIGH (ref 0.57–1.00)
GFR calc Af Amer: 64 mL/min/{1.73_m2} (ref >59)
GFR calc non Af Amer: 56 mL/min/{1.73_m2} — ABNORMAL LOW (ref >59)
Glucose: 117 mg/dL — ABNORMAL HIGH (ref 65–99)
Potassium: 4.6 mmol/L (ref 3.5–5.2)
Sodium: 139 mmol/L (ref 134–144)

## 2019-05-31 ENCOUNTER — Ambulatory Visit (INDEPENDENT_AMBULATORY_CARE_PROVIDER_SITE_OTHER): Payer: Medicare HMO

## 2019-05-31 ENCOUNTER — Other Ambulatory Visit: Payer: Self-pay

## 2019-05-31 DIAGNOSIS — I5042 Chronic combined systolic (congestive) and diastolic (congestive) heart failure: Secondary | ICD-10-CM | POA: Diagnosis not present

## 2019-05-31 NOTE — Progress Notes (Signed)
Called pt no answer, left vm  

## 2019-06-01 NOTE — Progress Notes (Signed)
Called pt to inform her about her lab results. Pt understood

## 2019-06-07 ENCOUNTER — Other Ambulatory Visit: Payer: Medicare HMO

## 2019-06-07 ENCOUNTER — Other Ambulatory Visit: Payer: Self-pay

## 2019-06-08 NOTE — Telephone Encounter (Signed)
Error

## 2019-06-11 ENCOUNTER — Ambulatory Visit: Payer: Medicare HMO | Admitting: Cardiology

## 2019-06-11 ENCOUNTER — Other Ambulatory Visit: Payer: Self-pay

## 2019-06-11 ENCOUNTER — Encounter: Payer: Self-pay | Admitting: Cardiology

## 2019-06-11 VITALS — BP 136/71 | Temp 97.5°F | Ht 63.0 in | Wt 345.0 lb

## 2019-06-11 DIAGNOSIS — I480 Paroxysmal atrial fibrillation: Secondary | ICD-10-CM

## 2019-06-11 DIAGNOSIS — F329 Major depressive disorder, single episode, unspecified: Secondary | ICD-10-CM

## 2019-06-11 DIAGNOSIS — F32A Depression, unspecified: Secondary | ICD-10-CM

## 2019-06-11 DIAGNOSIS — R9439 Abnormal result of other cardiovascular function study: Secondary | ICD-10-CM

## 2019-06-11 DIAGNOSIS — I5042 Chronic combined systolic (congestive) and diastolic (congestive) heart failure: Secondary | ICD-10-CM | POA: Diagnosis not present

## 2019-06-11 DIAGNOSIS — R6884 Jaw pain: Secondary | ICD-10-CM | POA: Diagnosis not present

## 2019-06-11 DIAGNOSIS — F419 Anxiety disorder, unspecified: Secondary | ICD-10-CM

## 2019-06-11 MED ORDER — ENTRESTO 24-26 MG PO TABS: 1.0000 | ORAL_TABLET | Freq: Two times a day (BID) | ORAL | 1 refills | Status: DC

## 2019-06-11 NOTE — Progress Notes (Signed)
Primary Physician/Referring:  Everardo Beals, NP  Patient ID: Lorraine Harris, female    DOB: 01/05/1951, 68 y.o.   MRN: [id number]  Chief Complaint  Patient presents with  . Follow-up    stress test and labs  . Congestive Heart Failure    HPI: Lorraine Harris  is a 68 y.o. female  with  morbid obesity, H/O non ischemic cardiomyopathy with normal coronary arteries by angio on 09/20/2014. HTN, bronchial asthma, and depression. She also was found to have left bundle branch block at that time. She has chronic shortness breath and dyspnea on exertion due to obesity and also prior history of tobacco use which she has quit.   She was seen in the emergency room on 02/09/2019 with chest tightness jaw pain suggestive of angina pectoris that she has always complained off and marked dyspnea and EKG by the EMS had revealed A. fib with RVR, wide-complex rhythm. She was started on Xarelto. She again presented to ER on 03/05/2019 not feeling well and short of breath. Noted again to be in A fib with RVR. TSH also elevated. Metoprolol was increased to 50 mg.  Recent echocardiogram on 04/22/19 showed continued moderately depressed LVEF of 35-40%. Due to concerning symptoms of angina, she underwent lexiscan nuclear stress test and now presents for results. She has continued to have occasional episodes of jaw pain.   She does continue to have fatigue.  Dyspnea is remained stable, no PND orthopnea, no palpitations. She continues to be grieving her mother's death and is having a hard time adjusting. States that she is depressed.  Past Medical History:  Diagnosis Date  . A-fib ([place])   . A-fib ([place])   . Asthma   . CHF (congestive heart failure) ([place])   . Hyperlipidemia   . Hypertension     Past Surgical History:  Procedure Laterality Date  . LEFT HEART CATHETERIZATION WITH CORONARY ANGIOGRAM N/A 09/20/2014   Procedure: LEFT HEART CATHETERIZATION WITH CORONARY ANGIOGRAM;  Surgeon: Laverda Page, MD;  Location: [facility] CATH LAB;  Service: Cardiovascular;  Laterality: N/A;    Social History   Socioeconomic History  . Marital status: Divorced    Spouse name: Not on file  . Number of children: 1  . Years of education: Not on file  . Highest education level: Not on file  Occupational History  . Not on file  Social Needs  . Financial resource strain: Not on file  . Food insecurity    Worry: Not on file    Inability: Not on file  . Transportation needs    Medical: Not on file    Non-medical: Not on file  Tobacco Use  . Smoking status: Former Smoker    Quit date: 2012    Years since quitting: 8.7  . Smokeless tobacco: Never Used  Substance and Sexual Activity  . Alcohol use: No  . Drug use: No  . Sexual activity: Not on file  Lifestyle  . Physical activity    Days per week: Not on file    Minutes per session: Not on file  . Stress: Not on file  Relationships  . Social Herbalist on phone: Not on file    Gets together: Not on file    Attends religious service: Not on file    Active member of club or organization: Not on file    Attends meetings of clubs or organizations: Not on file    Relationship status: Not  on file  . Intimate partner violence    Fear of current or ex partner: Not on file    Emotionally abused: Not on file    Physically abused: Not on file    Forced sexual activity: Not on file  Other Topics Concern  . Not on file  Social History Narrative  . Not on file   Review of Systems  Constitution: Positive for malaise/fatigue. Negative for chills, decreased appetite and weight gain.  Cardiovascular: Positive for dyspnea on exertion. Negative for chest pain, leg swelling, palpitations and syncope.  Respiratory: Positive for snoring. Negative for cough.   Endocrine: Negative for cold intolerance.  Hematologic/Lymphatic: Does not bruise/bleed easily.  Musculoskeletal: Positive for back pain. Negative for joint swelling.   Gastrointestinal: Negative for abdominal pain, anorexia, change in bowel habit, hematochezia and melena.  Neurological: Negative for dizziness, focal weakness, headaches and light-headedness.  Psychiatric/Behavioral: Positive for depression. Negative for substance abuse. The patient is nervous/anxious.   All other systems reviewed and are negative.     Objective  Blood pressure 136/71, temperature (!) 97.5 F (36.4 C), height 5\' 3"  (1.6 m), weight (!) 345 lb (156.5 kg), SpO2 93 %. Body mass index is 61.11 kg/m.     Physical Exam  Constitutional: She appears well-developed. No distress.  Morbidly obese  HENT:  Head: Atraumatic.  Eyes: Conjunctivae are normal.  Neck: Neck supple. No thyromegaly present.  Short neck and difficult to evaluate JVP  Cardiovascular: Normal rate and regular rhythm. Exam reveals no gallop.  Murmur heard.  Harsh midsystolic murmur is present with a grade of 3/6 at the upper right sternal border radiating to the neck. Pulses:      Carotid pulses are 2+ on the right side with bruit and 2+ on the left side.      Dorsalis pedis pulses are 2+ on the right side and 2+ on the left side.       Posterior tibial pulses are 2+ on the right side and 2+ on the left side.  Femoral and popliteal pulse difficult to feel due to patient's body habitus.  No edema.   Pulmonary/Chest: Effort normal and breath sounds normal.  Abdominal: Soft. Bowel sounds are normal.  Obese. Pannus present  Musculoskeletal: Normal range of motion.        General: No edema.  Neurological: She is alert.  Skin: Skin is warm and dry.  Psychiatric: She has a normal mood and affect.   Radiology: No results found.  Laboratory examination:    CMP Latest Ref Rng & Units 05/28/2019 03/05/2019 02/09/2019  Glucose 65 - 99 mg/dL 117(H) 98 127(H)  BUN 8 - 27 mg/dL 18 18 15   Creatinine 0.57 - 1.00 mg/dL 1.03(H) 1.13(H) 1.15(H)  Sodium 134 - 144 mmol/L 139 139 139  Potassium 3.5 - 5.2 mmol/L 4.6 4.4  3.8  Chloride 96 - 106 mmol/L 100 100 102  CO2 20 - 29 mmol/L 24 29 23   Calcium 8.7 - 10.3 mg/dL 8.9 9.0 9.2  Total Protein 6.5 - 8.1 g/dL - - 6.5  Total Bilirubin 0.3 - 1.2 mg/dL - - 0.4  Alkaline Phos 38 - 126 U/L - - 87  AST 15 - 41 U/L - - 13(L)  ALT 0 - 44 U/L - - 12   CBC Latest Ref Rng & Units 03/08/2019 03/05/2019 02/09/2019  WBC 3.4 - 10.8 x10E3/uL 7.2 7.9 8.5  Hemoglobin 11.1 - 15.9 g/dL 13.4 14.7 15.0  Hematocrit 34.0 - 46.6 % 42.8 46.9(H)  46.4(H)  Platelets 150 - 450 x10E3/uL 273 282 259   Lipid Panel     Component Value Date/Time   CHOL 126 03/08/2019 1052   TRIG 105 03/08/2019 1052   HDL 40 03/08/2019 1052   LDLCALC 65 03/08/2019 1052   HEMOGLOBIN A1C No results found for: HGBA1C, MPG TSH Recent Labs    03/05/19 1400  TSH 5.081*     Medications   Medications Discontinued During This Encounter  Medication Reason  . valsartan (DIOVAN) 80 MG tablet Discontinued by provider   Current Meds  Medication Sig  . albuterol (PROVENTIL HFA;VENTOLIN HFA) 108 (90 BASE) MCG/ACT inhaler Inhale 1 puff into the lungs every 6 (six) hours as needed for wheezing or shortness of breath.  . ALPRAZolam (XANAX) 0.5 MG tablet Take 0.25-0.5 mg by mouth daily as needed for anxiety.   Marland Kitchen atorvastatin (LIPITOR) 10 MG tablet TAKE 1 TABLET BY MOUTH EVERY DAY  . furosemide (LASIX) 40 MG tablet Take 1 tablet (40 mg total) by mouth daily.  . metoprolol succinate (TOPROL-XL) 50 MG 24 hr tablet Take 1 tablet (50 mg total) by mouth 2 (two) times a day. Take with or immediately following a meal.  . nitroGLYCERIN (NITROSTAT) 0.4 MG SL tablet Place 1 tablet (0.4 mg total) under the tongue every 5 (five) minutes as needed for chest pain.  . Rivaroxaban (XARELTO) 15 MG TABS tablet Take 15 mg by mouth daily.  Marland Kitchen spironolactone (ALDACTONE) 25 MG tablet Take 1 tablet (25 mg total) by mouth daily.  Marland Kitchen venlafaxine (EFFEXOR) 75 MG tablet Take 75 mg by mouth at bedtime.  . [DISCONTINUED] valsartan (DIOVAN) 80  MG tablet Take 1 tablet (80 mg total) by mouth daily.    Cardiac Studies:   Lexiscan Myoview stress test 06/07/2019: Lexiscan stress test was with 2-day protocol was performed. Stress EKG is non-diagnostic, as this is pharmacological stress test. Rest and stress EKG reveal sinus rhythm and left bundle branch block.Tissue attenuation seen in both rest and stress images, owing to large body habitus (313 lb). SPECT images reveal small area of mild intensity perfusion defect in inferior/inferolateral myocardium. Decreased global myocardial wall thickening and wall motion. Stress LVEF 27%. TID is 1.21. High risk study.   Coronary angiogram 09/20/2014: No significant coronary artery disease. Left dominant circulation. Ascending aortogram normal. Anterior origin of a small RCA. LV not evaluated due to inability to cross AV  Echo 04/22/2019:  1. The left ventricle has moderately reduced systolic function, with an ejection fraction of 35-40%. The cavity size was normal. Left ventricular diastolic Doppler parameters are consistent with impaired relaxation.  2. Right atrial size was mildly dilated.  3. The aortic valve is grossly normal. Mild-moderate stenosis of the aortic valve. Moderate aortic annular calcification noted.  4. The mitral valve was not well visualized. There is moderate mitral annular calcification present. No evidence of mitral valve stenosis.  5. The aorta is normal unless otherwise noted.  6. The aortic root and ascending aorta are normal in size and structure.  7. Technically difficult echo with poor image quality.  8. The atrial septum is grossly normal.   Assessment     ICD-10-CM   1. Chronic combined systolic and diastolic heart failure (HCC)  I50.42 CBC    Basic metabolic panel  2. Abnormal nuclear stress test  R94.39   3. Paroxysmal atrial fibrillation (HCC)  I48.0   4. Jaw pain  R68.84   5. Morbid obesity ([place])  E66.01   6. Anxiety and  depression  F41.9    F32.9      Orders Placed This Encounter  Procedures  . CBC  . Basic metabolic panel   EKG [id number]: Sinus bradycardia at 53 bpm, normal axis, LBBB, no further analysis due to LBBB.  EKG 03/05/2019 in ER: A fib with RVR at 138 bpm, LBBB  EMS telemetry strips from 02/09/2019: Atrial fibrillation with rapid ventricular spots at the rate of 160 bpm.  EKG 02/09/2019 in the ED: Normal sinus rhythm with rate of 75 bpm, left bundle branch block.  No further analysis.  No change from previous.  Recommendations:   I discussed her recent stress test results, noted to have mild perfusion abnormality, but considered high risk study in view of depressed LVEF and perfusion abnormalities.  She has continued to have occasional episodes of jaw pain.  In view of these findings, new onset A. fib and symptoms, will schedule for left heart cath for further evaluation.  She is on appropriate medical therapy.  In view of her CHF, and as she is tolerating valsartan well, will start her on low-dose Entresto and plan to hopefully uptitrate this.  Kidney function has remained stable, but will need continued monitoring.  Blood pressure is stable.  Has not had any recent episodes of A. fib that she is aware of.  Tolerating anticoagulation well, no bleeding diathesis.  She continues to battle depression with losing her mother and is hoping to see her PCP soon for further management.  I will see her back after her coronary angiogram for follow-up.   Miquel Dunn, MSN, APRN, FNP-C [facility]. [place] Office: [phone] Fax: [phone]

## 2019-06-16 ENCOUNTER — Encounter (HOSPITAL_COMMUNITY): Payer: Self-pay | Admitting: Cardiology

## 2019-06-18 ENCOUNTER — Other Ambulatory Visit (HOSPITAL_COMMUNITY): Payer: Self-pay

## 2019-07-01 ENCOUNTER — Ambulatory Visit: Payer: Medicare HMO | Admitting: Cardiology

## 2019-07-02 ENCOUNTER — Other Ambulatory Visit (HOSPITAL_COMMUNITY): Payer: Self-pay

## 2019-07-14 ENCOUNTER — Ambulatory Visit: Payer: Medicare HMO | Admitting: Cardiology

## 2019-07-23 ENCOUNTER — Other Ambulatory Visit (HOSPITAL_COMMUNITY): Payer: Self-pay

## 2019-07-27 DIAGNOSIS — I5042 Chronic combined systolic (congestive) and diastolic (congestive) heart failure: Secondary | ICD-10-CM | POA: Diagnosis present

## 2019-08-06 ENCOUNTER — Ambulatory Visit: Payer: Medicare HMO | Admitting: Cardiology

## 2019-08-06 ENCOUNTER — Telehealth: Payer: Self-pay

## 2019-08-06 ENCOUNTER — Other Ambulatory Visit (HOSPITAL_COMMUNITY): Payer: Self-pay

## 2019-08-06 NOTE — Telephone Encounter (Signed)
That's fine. Glad she is feeling better. I would still like for her to get labs done since I started her on Entresto at her last visit and we can make her appt on 12/16 virtual

## 2019-08-06 NOTE — Telephone Encounter (Signed)
Sounds good. Just be sure she has BP monitor

## 2019-08-06 NOTE — Telephone Encounter (Signed)
Pt's says oxygen pulse and oxygen are all normal. She will switched her appt to virtual and get labs done prior.

## 2019-08-06 NOTE — Telephone Encounter (Signed)
The patient called and wanted to cancel her upcoming cath, she is feeling a lot better and is not comfortable going to the hospital with the Covid-19 going on

## 2019-08-10 ENCOUNTER — Ambulatory Visit (HOSPITAL_COMMUNITY): Admission: RE | Admit: 2019-08-10 | Payer: Medicare HMO | Source: Home / Self Care | Admitting: Cardiology

## 2019-08-10 ENCOUNTER — Encounter (HOSPITAL_COMMUNITY): Admission: RE | Payer: Self-pay | Source: Home / Self Care

## 2019-08-10 SURGERY — LEFT HEART CATH AND CORONARY ANGIOGRAPHY
Anesthesia: LOCAL

## 2019-08-11 LAB — BASIC METABOLIC PANEL
BUN/Creatinine Ratio: 21 (ref 12–28)
BUN: 18 mg/dL (ref 8–27)
CO2: 27 mmol/L (ref 20–29)
Calcium: 10.5 mg/dL — ABNORMAL HIGH (ref 8.7–10.3)
Chloride: 99 mmol/L (ref 96–106)
Creatinine, Ser: 0.87 mg/dL (ref 0.57–1.00)
GFR calc Af Amer: 79 mL/min/{1.73_m2} (ref >59)
GFR calc non Af Amer: 68 mL/min/{1.73_m2} (ref >59)
Glucose: 98 mg/dL (ref 65–99)
Potassium: 4.7 mmol/L (ref 3.5–5.2)
Sodium: 141 mmol/L (ref 134–144)

## 2019-08-11 LAB — CBC
Hematocrit: 41.4 % (ref 34.0–46.6)
Hemoglobin: 13.5 g/dL (ref 11.1–15.9)
MCH: 28.4 pg (ref 26.6–33.0)
MCHC: 32.6 g/dL (ref 31.5–35.7)
MCV: 87 fL (ref 79–97)
Platelets: 286 10*3/uL (ref 150–450)
RBC: 4.75 x10E6/uL (ref 3.77–5.28)
RDW: 14.4 % (ref 11.7–15.4)
WBC: 8.5 10*3/uL (ref 3.4–10.8)

## 2019-08-18 ENCOUNTER — Encounter: Payer: Self-pay | Admitting: Cardiology

## 2019-08-18 ENCOUNTER — Telehealth (INDEPENDENT_AMBULATORY_CARE_PROVIDER_SITE_OTHER): Payer: Medicare HMO | Admitting: Cardiology

## 2019-08-18 ENCOUNTER — Other Ambulatory Visit: Payer: Self-pay

## 2019-08-18 VITALS — BP 100/62 | HR 56

## 2019-08-18 DIAGNOSIS — I5042 Chronic combined systolic (congestive) and diastolic (congestive) heart failure: Secondary | ICD-10-CM | POA: Diagnosis not present

## 2019-08-18 DIAGNOSIS — I480 Paroxysmal atrial fibrillation: Secondary | ICD-10-CM | POA: Diagnosis not present

## 2019-08-18 DIAGNOSIS — R9439 Abnormal result of other cardiovascular function study: Secondary | ICD-10-CM | POA: Diagnosis not present

## 2019-08-18 NOTE — Progress Notes (Signed)
Primary Physician/Referring:  Everardo Beals, NP  Patient ID: Lorraine Harris, female    DOB: 30-May-1951, 68 y.o.   MRN: [id number]  Chief Complaint  Patient presents with  . Congestive Heart Failure    low bp   This visit type was conducted due to national recommendations for restrictions regarding the COVID-19 Pandemic (e.g. social distancing).  This format is felt to be most appropriate for this patient at this time.  All issues noted in this document were discussed and addressed.  No physical exam was performed (except for noted visual exam findings with Telehealth visits).  The patient has consented to conduct a Telehealth visit and understands insurance will be billed.   I discussed the limitations of evaluation and management by telemedicine and the availability of in person appointments. The patient expressed understanding and agreed to proceed.  Virtual Visit via Video Note is as below  I connected with@, on 08/18/19 at 1310 by telephone and verified that I am speaking with the correct person using two identifiers. Unable to perform video visit as patient did not have equipment.    I have discussed with the patient regarding the safety during COVID Pandemic and steps and precautions including social distancing with the patient.    HPI: Lorraine Harris  is a 68 y.o. female  with  morbid obesity, H/O non ischemic cardiomyopathy with normal coronary arteries by angio on 09/20/2014. HTN, bronchial asthma, and depression. She also was found to have left bundle branch block at that time. She has chronic shortness breath and dyspnea on exertion due to obesity and also prior history of tobacco use which she has quit.   She was seen in the emergency room on 02/09/2019 with chest tightness jaw pain suggestive of angina pectoris that she has always complained off and marked dyspnea and EKG by the EMS had revealed A. fib with RVR, wide-complex rhythm. She was started on Xarelto.  She again presented to ER on 03/05/2019 not feeling well and short of breath. Noted again to be in A fib with RVR. TSH also elevated. Metoprolol was increased to 50 mg.  Recent echocardiogram on 04/22/19 showed continued moderately depressed LVEF of 35-40%. Due to concerning symptoms of angina, she underwent lexiscan nuclear stress test on 06/04/19 that showed mild intensity perfusion abnormality and depressed LVEF. She was scheduled for a cath; however, has since cancelled as feels better and would like to not undergo the procedure at this time.  She was started on Entresto at her last visit. She states that she feels much better than she has in a long time.  Dyspnea is remained stable, no PND orthopnea, no palpitations. No heart racing. She does continue to have rare episodes of jaw pain, but none recently. She is having a hard time getting through the holidays without her mother.   Past Medical History:  Diagnosis Date  . A-fib ([place])   . A-fib ([place])   . Asthma   . CHF (congestive heart failure) ([place])   . Chronic combined systolic and diastolic heart failure ([place]) 12/17/2018  . Hyperlipidemia   . Hypertension     Past Surgical History:  Procedure Laterality Date  . LEFT HEART CATHETERIZATION WITH CORONARY ANGIOGRAM N/A 09/20/2014   Procedure: LEFT HEART CATHETERIZATION WITH CORONARY ANGIOGRAM;  Surgeon: Laverda Page, MD;  Location[facility] C[facility];  Service: Cardiovascular;  Laterality: N/A;    Social History   Socioeconomic History  . Marital status: Divorced  Spouse name: Not on file  . Number of children: 1  . Years of education: Not on file  . Highest education level: Not on file  Occupational History  . Not on file  Tobacco Use  . Smoking status: Former Smoker    Quit date: 2012    Years since quitting: 8.9  . Smokeless tobacco: Never Used  Substance and Sexual Activity  . Alcohol use: No  . Drug use: No  . Sexual activity: Not on file  Other Topics Concern  . Not  on file  Social History Narrative  . Not on file   Social Determinants of Health   Financial Resource Strain:   . Difficulty of Paying Living Expenses: Not on file  Food Insecurity:   . Worried About Charity fundraiser in the Last Year: Not on file  . Ran Out of Food in the Last Year: Not on file  Transportation Needs:   . Lack of Transportation (Medical): Not on file  . Lack of Transportation (Non-Medical): Not on file  Physical Activity:   . Days of Exercise per Week: Not on file  . Minutes of Exercise per Session: Not on file  Stress:   . Feeling of Stress : Not on file  Social Connections:   . Frequency of Communication with Friends and Family: Not on file  . Frequency of Social Gatherings with Friends and Family: Not on file  . Attends Religious Services: Not on file  . Active Member of Clubs or Organizations: Not on file  . Attends Archivist Meetings: Not on file  . Marital Status: Not on file  Intimate Partner Violence:   . Fear of Current or Ex-Partner: Not on file  . Emotionally Abused: Not on file  . Physically Abused: Not on file  . Sexually Abused: Not on file   Review of Systems  Constitution: Negative for chills, decreased appetite, malaise/fatigue and weight gain.  Cardiovascular: Positive for dyspnea on exertion. Negative for chest pain, leg swelling, palpitations and syncope.  Respiratory: Positive for snoring. Negative for cough.   Endocrine: Negative for cold intolerance.  Hematologic/Lymphatic: Does not bruise/bleed easily.  Musculoskeletal: Positive for back pain. Negative for joint swelling.  Gastrointestinal: Negative for abdominal pain, anorexia, change in bowel habit, hematochezia and melena.  Neurological: Negative for dizziness, focal weakness, headaches and light-headedness.  Psychiatric/Behavioral: Positive for depression. Negative for substance abuse. The patient is nervous/anxious.   All other systems reviewed and are negative.      Objective  Blood pressure 100/62, pulse (!) 56, SpO2 (!) 89 %. There is no height or weight on file to calculate BMI.   Physical exam not performed or limited due to virtual visit.  Patient appeared to be in no distress, Neck was supple, respiration was not labored.  Please see exam details from prior visit is as below.      Physical Exam  Constitutional: She appears well-developed. No distress.  Morbidly obese  HENT:  Head: Atraumatic.  Eyes: Conjunctivae are normal.  Neck: No thyromegaly present.  Short neck and difficult to evaluate JVP  Cardiovascular: Normal rate and regular rhythm. Exam reveals no gallop.  Murmur heard.  Harsh midsystolic murmur is present with a grade of 3/6 at the upper right sternal border radiating to the neck. Pulses:      Carotid pulses are 2+ on the right side with bruit and 2+ on the left side.      Dorsalis pedis pulses are 2+ on  the right side and 2+ on the left side.       Posterior tibial pulses are 2+ on the right side and 2+ on the left side.  Femoral and popliteal pulse difficult to feel due to patient's body habitus.  No edema.   Pulmonary/Chest: Effort normal and breath sounds normal.  Abdominal: Soft. Bowel sounds are normal.  Obese. Pannus present  Musculoskeletal:        General: No edema. Normal range of motion.     Cervical back: Neck supple.  Neurological: She is alert.  Skin: Skin is warm and dry.  Psychiatric: She has a normal mood and affect.   Radiology: No results found.  Laboratory examination:    CMP Latest Ref Rng & Units 08/10/2019 05/28/2019 03/05/2019  Glucose 65 - 99 mg/dL 98 117(H) 98  BUN 8 - 27 mg/dL 18 18 18   Creatinine 0.57 - 1.00 mg/dL 0.87 1.03(H) 1.13(H)  Sodium 134 - 144 mmol/L 141 139 139  Potassium 3.5 - 5.2 mmol/L 4.7 4.6 4.4  Chloride 96 - 106 mmol/L 99 100 100  CO2 20 - 29 mmol/L 27 24 29   Calcium 8.7 - 10.3 mg/dL 10.5(H) 8.9 9.0  Total Protein 6.5 - 8.1 g/dL - - -  Total Bilirubin 0.3 - 1.2  mg/dL - - -  Alkaline Phos 38 - 126 U/L - - -  AST 15 - 41 U/L - - -  ALT 0 - 44 U/L - - -   CBC Latest Ref Rng & Units 08/10/2019 03/08/2019 03/05/2019  WBC 3.4 - 10.8 x10E3/uL 8.5 7.2 7.9  Hemoglobin 11.1 - 15.9 g/dL 13.5 13.4 14.7  Hematocrit 34.0 - 46.6 % 41.4 42.8 46.9(H)  Platelets 150 - 450 x10E3/uL 286 273 282   Lipid Panel     Component Value Date/Time   CHOL 126 03/08/2019 1052   TRIG 105 03/08/2019 1052   HDL 40 03/08/2019 1052   LDLCALC 65 03/08/2019 1052   HEMOGLOBIN A1C No results found for: HGBA1C, MPG TSH Recent Labs    03/05/19 1400  TSH 5.081*     Medications   There are no discontinued medications. Current Meds  Medication Sig  . albuterol (PROVENTIL HFA;VENTOLIN HFA) 108 (90 BASE) MCG/ACT inhaler Inhale 1 puff into the lungs every 6 (six) hours as needed for wheezing or shortness of breath.  . ALPRAZolam (XANAX) 0.5 MG tablet Take 0.5 mg by mouth 3 (three) times daily as needed for anxiety.   Marland Kitchen atorvastatin (LIPITOR) 10 MG tablet TAKE 1 TABLET BY MOUTH EVERY DAY (Patient taking differently: Take 10 mg by mouth daily. )  . diphenhydrAMINE (EQ ALLERGY RELIEF) 25 MG tablet Take 25 mg by mouth every 6 (six) hours as needed for allergies.  . famotidine (PEPCID) 20 MG tablet Take 20 mg by mouth daily.  . furosemide (LASIX) 40 MG tablet Take 1 tablet (40 mg total) by mouth daily.  . metoprolol succinate (TOPROL-XL) 50 MG 24 hr tablet Take 1 tablet (50 mg total) by mouth 2 (two) times a day. Take with or immediately following a meal. (Patient taking differently: Take 150 mg by mouth 2 (two) times a day. Take with or immediately following a meal. )  . nitroGLYCERIN (NITROSTAT) 0.4 MG SL tablet Place 1 tablet (0.4 mg total) under the tongue every 5 (five) minutes as needed for chest pain.  . Rivaroxaban (XARELTO) 15 MG TABS tablet Take 15 mg by mouth daily with supper.   . sacubitril-valsartan (ENTRESTO) 24-26 MG Take 1  tablet by mouth 2 (two) times daily.  Marland Kitchen  spironolactone (ALDACTONE) 25 MG tablet Take 1 tablet (25 mg total) by mouth daily.  Marland Kitchen venlafaxine (EFFEXOR) 75 MG tablet Take 75 mg by mouth at bedtime.    Cardiac Studies:   Lexiscan Myoview stress test 06/07/2019: Lexiscan stress test was with 2-day protocol was performed. Stress EKG is non-diagnostic, as this is pharmacological stress test. Rest and stress EKG reveal sinus rhythm and left bundle branch block.Tissue attenuation seen in both rest and stress images, owing to large body habitus (313 lb). SPECT images reveal small area of mild intensity perfusion defect in inferior/inferolateral myocardium. Decreased global myocardial wall thickening and wall motion. Stress LVEF 27%. TID is 1.21. High risk study.   Coronary angiogram 09/20/2014: No significant coronary artery disease. Left dominant circulation. Ascending aortogram normal. Anterior origin of a small RCA. LV not evaluated due to inability to cross AV  Echo 04/22/2019:  1. The left ventricle has moderately reduced systolic function, with an ejection fraction of 35-40%. The cavity size was normal. Left ventricular diastolic Doppler parameters are consistent with impaired relaxation.  2. Right atrial size was mildly dilated.  3. The aortic valve is grossly normal. Mild-moderate stenosis of the aortic valve. Moderate aortic annular calcification noted.  4. The mitral valve was not well visualized. There is moderate mitral annular calcification present. No evidence of mitral valve stenosis.  5. The aorta is normal unless otherwise noted.  6. The aortic root and ascending aorta are normal in size and structure.  7. Technically difficult echo with poor image quality.  8. The atrial septum is grossly normal.   Assessment     ICD-10-CM   1. Chronic combined systolic and diastolic heart failure (HCC)  I50.42   2. Abnormal nuclear stress test  R94.39   3. Paroxysmal atrial fibrillation (HCC)  I48.0   4. Morbid obesity ([place])  E66.01      No orders of the defined types were placed in this encounter.  EKG 03/12/2019: Sinus bradycardia at 53 bpm, normal axis, LBBB, no further analysis due to LBBB.  EKG 03/05/2019 in ER: A fib with RVR at 138 bpm, LBBB  EMS telemetry strips from 02/09/2019: Atrial fibrillation with rapid ventricular spots at the rate of 160 bpm.  EKG 02/09/2019 in the ED: Normal sinus rhythm with rate of 75 bpm, left bundle branch block.  No further analysis.  No change from previous.  Recommendations:   Since last seen by me, she reports that she is feeling much better and would like to avoid heart cath if possible. Although had abnormal nuclear stress test, her symptoms are improved. We will continue with medical management for now, but if she has any worsening symptoms, will have low threshold to proceed with coronary angiogram.   She has not had any known recurrence of A fib. Continue with anticoagulation. No bleeding diathesis reported. She is trying to work on her weight with being more active and through diet. Urged her to continue with this.   Since being on Entresto, blood pressure is borderline soft. She is asymptomatic. Will continue with low dose. Advised for her to contact me if she notices any dizziness. Kidney function has been stable. I will plan to see her back in 3 months, but encouraged sooner follow up if needed.   Miquel Dunn, MSN, APRN, FNP-C [facility]. [place] Office: [phone] Fax: [phone]

## 2019-08-31 ENCOUNTER — Telehealth: Payer: Self-pay

## 2019-09-01 ENCOUNTER — Other Ambulatory Visit: Payer: Self-pay

## 2019-09-01 MED ORDER — ENTRESTO 24-26 MG PO TABS: 1.0000 | ORAL_TABLET | Freq: Two times a day (BID) | ORAL | 1 refills | Status: DC

## 2019-09-02 ENCOUNTER — Telehealth: Payer: Self-pay

## 2019-09-02 NOTE — Telephone Encounter (Signed)
Pt called back stating that her bp was now 75/52 hr 72; please advise

## 2019-09-02 NOTE — Telephone Encounter (Signed)
Pt aware and will call us back on Monday with bp readings

## 2019-09-02 NOTE — Telephone Encounter (Signed)
Pt called in with low bp readings and stating that she is feeling weak.  84/52 hr 145 Checked 15 min later during our call 70/42 hr 62 Verbally discussed with AK and she said to have pt push fluids and to lay down for about an hour and then recheck bp and to call back with update. Pt aware of instructions.//ah

## 2019-09-06 NOTE — Telephone Encounter (Signed)
Pt called back today with bp of 123/71 and she says that she maintained that all weekend

## 2019-09-06 NOTE — Telephone Encounter (Signed)
We can try half a tablet of Entresto and see how she tolerates this. Be sure to stay well hydrated.

## 2019-09-07 NOTE — Telephone Encounter (Signed)
Patient called to report her most recent vitals since stopping the ENTESTRO:  BP: 146/76 HR: 61  BP : 124/68 HR: 58  BP:138/78 HR: 60  BP :123/71 HR: 63  BP : 91/51 HR : 59  She stated that she has been feeling fine, except one time that her HR: went up to 149, but it went back down almost immediately.  Patient is also asking if it's ok to take Vitamin C and Zinc? Please advise.

## 2019-10-09 ENCOUNTER — Other Ambulatory Visit: Payer: Self-pay | Admitting: Cardiology

## 2019-10-09 DIAGNOSIS — I480 Paroxysmal atrial fibrillation: Secondary | ICD-10-CM

## 2019-10-19 ENCOUNTER — Ambulatory Visit (INDEPENDENT_AMBULATORY_CARE_PROVIDER_SITE_OTHER): Payer: Medicare HMO

## 2019-10-19 ENCOUNTER — Other Ambulatory Visit: Payer: Self-pay

## 2019-10-19 ENCOUNTER — Ambulatory Visit: Payer: Medicare HMO | Admitting: Orthopaedic Surgery

## 2019-10-19 ENCOUNTER — Encounter: Payer: Self-pay | Admitting: Orthopaedic Surgery

## 2019-10-19 VITALS — BP 124/67 | HR 56 | Ht 63.0 in | Wt 345.0 lb

## 2019-10-19 DIAGNOSIS — M79605 Pain in left leg: Secondary | ICD-10-CM

## 2019-10-19 DIAGNOSIS — S8002XA Contusion of left knee, initial encounter: Secondary | ICD-10-CM | POA: Diagnosis not present

## 2019-10-19 NOTE — Progress Notes (Signed)
Office Visit Note   Patient: Lorraine Harris           Date of Birth: 11-Feb-1951           MRN: [id number] Visit Date: 10/19/2019              Requested by: Everardo Beals, NP [address] [place],  [place] [address] PCP: Everardo Beals, NP   Assessment & Plan: Visit Diagnoses:  1. Pain in left leg   2. Contusion of left knee, initial encounter     Plan: Patient states EMS was worried that she might have problems with a blood clot after the fall.  She is already been on Xarelto due to her heart failure.  She can elevate her legs for the swelling continue doing some walking using her walker to prevent repeat falling.  Follow-up as needed.  Follow-Up Instructions: No follow-ups on file.   Orders:  Orders Placed This Encounter  Procedures  . XR Knee 1-2 Views Left  . XR Tibia/Fibula Left   No orders of the defined types were placed in this encounter.     Procedures: No procedures performed   Clinical Data: No additional findings.   Subjective: Chief Complaint  Patient presents with  . Left Leg - Pain    Fall 10/14/2019    HPI 69 year old female morbidly obese with BMI of 61 had a fall on 10/14/2019 hitting medial anterior aspect of her her left knee.  EMS came and checked her blood pressure she is already on Eliquis 50 mg daily with history of dilated cardiomyopathy.  She has had considerable bruising had some small blisters anterolateral.  She states she is really not having any pain but is noticed a lot of swelling and ecchymosis that extends all the way to her ankle.  No past history of injury to her knee.  Review of Systems   Objective: Vital Signs: BP 124/67   Pulse (!) 56   Ht 5\' 3"  (1.6 m)   Wt (!) 345 lb (156.5 kg)   BMI 61.11 kg/m   Physical Exam Constitutional:      Appearance: She is well-developed.     Comments: Morbidly obese in wheelchair.  HENT:     Head: Normocephalic.     Right Ear: External ear normal.     Left Ear:  External ear normal.  Eyes:     Pupils: Pupils are equal, round, and reactive to light.  Neck:     Thyroid: No thyromegaly.     Trachea: No tracheal deviation.  Cardiovascular:     Rate and Rhythm: Normal rate.  Pulmonary:     Effort: Pulmonary effort is normal.  Abdominal:     Palpations: Abdomen is soft.  Skin:    General: Skin is warm and dry.  Neurological:     Mental Status: She is alert and oriented to person, place, and time.  Psychiatric:        Behavior: Behavior normal.     Ortho Exam patient has halo over the inferior medial pole of the patella with ecchymosis that extends medial mid thigh and down the leg almost to the ankle with ecchymosis.  Subcutaneous fullness is noted with subtendinous swelling.  Compartments appear to be soft ankle dorsiflexion plantarflexion is intact sensation of the foot is intact.  Specialty Comments:  No specialty comments available.  Imaging: XR Knee 1-2 Views Left  Result Date: 10/19/2019 Three-view x-rays left knee obtained and reviewed.  This shows mild  medial joint line narrowing.  Negative for acute injury post fall.  Soft tissue swelling noted anterior to the tibial tubercle and patella. Impression: Left knee x-rays negative for acute fracture.  Soft tissue contusion noted anteriorly and inferior to the knee.  XR Tibia/Fibula Left  Result Date: 10/19/2019 AP lateral left tibia-fibula x-rays obtained and reviewed.  These are negative for bone fracture post fall. Impression: Negative left tibia-fibula x-rays for fracture.    PMFS History: Patient Active Problem List   Diagnosis Date Noted  . Contusion of left knee 10/19/2019  . Chronic combined systolic and diastolic heart failure ([place]) 12/17/2018  . Mixed hyperlipidemia 12/17/2018  . Dilated cardiomyopathy ([place]) 09/20/2014  . LBBB (left bundle branch block) 09/20/2014  . AORTIC VALVE DISORDERS 03/21/2008  . OTITIS EXTERNA, ACUTE, LEFT 03/14/2008  . COLONIC POLYPS, BENIGN  07/09/2007  . Morbid obesity ([place]) 07/09/2007  . DEPRESSION 07/09/2007  . MIGRAINE HEADACHE 07/09/2007  . Essential hypertension 07/09/2007  . ESOPHAGEAL STRICTURE 05/11/2001   Past Medical History:  Diagnosis Date  . A-fib ([place])   . A-fib ([place])   . Asthma   . CHF (congestive heart failure) ([place])   . Chronic combined systolic and diastolic heart failure ([place]) 12/17/2018  . Hyperlipidemia   . Hypertension     Family History  Problem Relation Age of Onset  . Heart disease Mother   . Kidney disease Father   . Heart attack Father   . Heart disease Sister     Past Surgical History:  Procedure Laterality Date  . LEFT HEART CATHETERIZATION WITH CORONARY ANGIOGRAM N/A 09/20/2014   Procedure: LEFT HEART CATHETERIZATION WITH CORONARY ANGIOGRAM;  Surgeon: Laverda Page, MD;  Location: [facility] CATH LAB;  Service: Cardiovascular;  Laterality: N/A;   Social History   Occupational History  . Not on file  Tobacco Use  . Smoking status: Former Smoker    Quit date: 2012    Years since quitting: 9.1  . Smokeless tobacco: Never Used  Substance and Sexual Activity  . Alcohol use: No  . Drug use: No  . Sexual activity: Not on file

## 2019-10-20 ENCOUNTER — Ambulatory Visit: Payer: Medicare HMO | Admitting: Cardiology

## 2019-10-20 ENCOUNTER — Encounter: Payer: Self-pay | Admitting: Cardiology

## 2019-10-20 VITALS — BP 121/59 | HR 58 | Ht 64.0 in | Wt 354.1 lb

## 2019-10-20 DIAGNOSIS — I5042 Chronic combined systolic (congestive) and diastolic (congestive) heart failure: Secondary | ICD-10-CM

## 2019-10-20 DIAGNOSIS — I480 Paroxysmal atrial fibrillation: Secondary | ICD-10-CM

## 2019-10-20 DIAGNOSIS — M79662 Pain in left lower leg: Secondary | ICD-10-CM

## 2019-10-20 DIAGNOSIS — R9439 Abnormal result of other cardiovascular function study: Secondary | ICD-10-CM

## 2019-10-20 DIAGNOSIS — M7989 Other specified soft tissue disorders: Secondary | ICD-10-CM

## 2019-10-20 NOTE — Progress Notes (Signed)
Primary Physician/Referring:  Everardo Beals, NP  Patient ID: Lorraine Harris, female    DOB: 1951-04-22, 69 y.o.   MRN: [id number]  Chief Complaint  Patient presents with  . Congestive Heart Failure  . Atrial Fibrillation  . Results    stress test  . Follow-up    32mo     HPI: Lorraine Harris  is a 69 y.o. female  with  morbid obesity, H/O non ischemic cardiomyopathy with normal coronary arteries by angio on 09/20/2014. HTN, bronchial asthma, and depression.. She has chronic shortness breath and dyspnea on exertion due to obesity and also prior history of tobacco use which she has quit.   She was seen in the emergency room on 02/09/2019 with chest tightness jaw pain suggestive of angina pectoris that she has always complained off and marked dyspnea and EKG by the EMS had revealed A. fib with RVR, wide-complex rhythm. She was started on Xarelto. She again presented to ER on 03/05/2019 not feeling well and short of breath. Noted again to be in A fib with RVR. TSH also elevated. Metoprolol was increased to 50 mg.  Recent echocardiogram on 04/22/19 showed continued moderately depressed LVEF of 35-40%. Due to concerning symptoms of angina, she underwent lexiscan nuclear stress test on 06/04/19 that showed mild intensity perfusion abnormality and depressed LVEF. She was scheduled for a cath; however, cancelled as she was feeling better and did not want to undergo the procedure at this time.  She is here for 3 month follow up. She fell recently after tripping over some shoes and has injured her left leg. Does have significant swelling and bruising to her left leg. Extremely tender.  She has had one episode of heart racing since last seen by [place], heart rate was 139 that lasted for 30 minutes. Dyspnea is remained stable, no PND orthopnea, no palpitations. No heart racing. She does continue to have rare episodes of jaw pain, but none recently. She is no longer on Entresto therapy as she was  having episodes of low blood pressure.  Weight has increased slightly recently due to inactivity. She is hoping to start exercise program.  Past Medical History:  Diagnosis Date  . A-fib ([place])   . A-fib ([place])   . Asthma   . CHF (congestive heart failure) ([place])   . Chronic combined systolic and diastolic heart failure ([place]) 12/17/2018  . Hyperlipidemia   . Hypertension     Past Surgical History:  Procedure Laterality Date  . LEFT HEART CATHETERIZATION WITH CORONARY ANGIOGRAM N/A 09/20/2014   Procedure: LEFT HEART CATHETERIZATION WITH CORONARY ANGIOGRAM;  Surgeon: Laverda Page, MD;  Location: [facility] CATH LAB;  Service: Cardiovascular;  Laterality: N/A;    Social History   Socioeconomic History  . Marital status: Divorced    Spouse name: Not on file  . Number of children: 1  . Years of education: Not on file  . Highest education level: Not on file  Occupational History  . Not on file  Tobacco Use  . Smoking status: Former Smoker    Packs/day: 1.00    Years: 30.00    Pack years: 30.00    Quit date: 2012    Years since quitting: 9.1  . Smokeless tobacco: Never Used  Substance and Sexual Activity  . Alcohol use: No  . Drug use: No  . Sexual activity: Not on file  Other Topics Concern  . Not on file  Social History Narrative  . Not on file  Social Determinants of Health   Financial Resource Strain:   . Difficulty of Paying Living Expenses: Not on file  Food Insecurity:   . Worried About Charity fundraiser in the Last Year: Not on file  . Ran Out of Food in the Last Year: Not on file  Transportation Needs:   . Lack of Transportation (Medical): Not on file  . Lack of Transportation (Non-Medical): Not on file  Physical Activity:   . Days of Exercise per Week: Not on file  . Minutes of Exercise per Session: Not on file  Stress:   . Feeling of Stress : Not on file  Social Connections:   . Frequency of Communication with Friends and Family: Not on file  .  Frequency of Social Gatherings with Friends and Family: Not on file  . Attends Religious Services: Not on file  . Active Member of Clubs or Organizations: Not on file  . Attends Archivist Meetings: Not on file  . Marital Status: Not on file  Intimate Partner Violence:   . Fear of Current or Ex-Partner: Not on file  . Emotionally Abused: Not on file  . Physically Abused: Not on file  . Sexually Abused: Not on file   Review of Systems  Constitution: Negative for chills, decreased appetite, malaise/fatigue and weight gain.  Cardiovascular: Positive for dyspnea on exertion. Negative for chest pain, leg swelling, palpitations and syncope.  Respiratory: Positive for snoring. Negative for cough.   Endocrine: Negative for cold intolerance.  Hematologic/Lymphatic: Does not bruise/bleed easily.  Musculoskeletal: Positive for back pain. Negative for joint swelling.  Gastrointestinal: Negative for abdominal pain, anorexia, change in bowel habit, hematochezia and melena.  Neurological: Negative for dizziness, focal weakness, headaches and light-headedness.  Psychiatric/Behavioral: Positive for depression. Negative for substance abuse. The patient is nervous/anxious.   All other systems reviewed and are negative.     Objective  Blood pressure (!) 121/59, pulse (!) 58, height 5\' 4"  (1.626 m), weight (!) 354 lb 1.6 oz (160.6 kg), SpO2 90 %. Body mass index is 60.78 kg/m.      Physical Exam  Constitutional: She appears well-developed. No distress.  Morbidly obese  HENT:  Head: Atraumatic.  Eyes: Conjunctivae are normal.  Neck: No thyromegaly present.  Short neck and difficult to evaluate JVP  Cardiovascular: Normal rate and regular rhythm. Exam reveals no gallop.  Murmur heard.  Harsh midsystolic murmur is present with a grade of 3/6 at the upper right sternal border radiating to the neck. Pulses:      Carotid pulses are 2+ on the right side with bruit and 2+ on the left side.       Dorsalis pedis pulses are 2+ on the right side and 2+ on the left side.       Posterior tibial pulses are 2+ on the right side and 2+ on the left side.  Femoral and popliteal pulse difficult to feel due to patient's body habitus.  No edema.   Pulmonary/Chest: Effort normal. No respiratory distress. She has wheezes.  Abdominal: Soft. Bowel sounds are normal.  Obese. Pannus present  Musculoskeletal:        General: No edema. Normal range of motion.     Cervical back: Neck supple.  Neurological: She is alert.  Skin: Skin is warm and dry.  Psychiatric: She has a normal mood and affect.   Radiology: No results found.  Laboratory examination:    CMP Latest Ref Rng & Units 08/10/2019 05/28/2019 03/05/2019  Glucose 65 - 99 mg/dL 98 117(H) 98  BUN 8 - 27 mg/dL 18 18 18   Creatinine 0.57 - 1.00 mg/dL 0.87 1.03(H) 1.13(H)  Sodium 134 - 144 mmol/L 141 139 139  Potassium 3.5 - 5.2 mmol/L 4.7 4.6 4.4  Chloride 96 - 106 mmol/L 99 100 100  CO2 20 - 29 mmol/L 27 24 29   Calcium 8.7 - 10.3 mg/dL 10.5(H) 8.9 9.0  Total Protein 6.5 - 8.1 g/dL - - -  Total Bilirubin 0.3 - 1.2 mg/dL - - -  Alkaline Phos 38 - 126 U/L - - -  AST 15 - 41 U/L - - -  ALT 0 - 44 U/L - - -   CBC Latest Ref Rng & Units 08/10/2019 03/08/2019 03/05/2019  WBC 3.4 - 10.8 x10E3/uL 8.5 7.2 7.9  Hemoglobin 11.1 - 15.9 g/dL 13.5 13.4 14.7  Hematocrit 34.0 - 46.6 % 41.4 42.8 46.9(H)  Platelets 150 - 450 x10E3/uL 286 273 282   Lipid Panel     Component Value Date/Time   CHOL 126 03/08/2019 1052   TRIG 105 03/08/2019 1052   HDL 40 03/08/2019 1052   LDLCALC 65 03/08/2019 1052   HEMOGLOBIN A1C No results found for: HGBA1C, MPG TSH Recent Labs    03/05/19 1400  TSH 5.081*     Medications   Medications Discontinued During This Encounter  Medication Reason  . sacubitril-valsartan (ENTRESTO) 24-26 MG Discontinued by provider  . famotidine (PEPCID) 20 MG tablet Change in therapy   Current Meds  Medication Sig  . albuterol  (PROVENTIL HFA;VENTOLIN HFA) 108 (90 BASE) MCG/ACT inhaler Inhale 1 puff into the lungs every 6 (six) hours as needed for wheezing or shortness of breath.  . ALPRAZolam (XANAX) 0.5 MG tablet Take 0.5 mg by mouth 3 (three) times daily as needed for anxiety.   Marland Kitchen atorvastatin (LIPITOR) 10 MG tablet TAKE 1 TABLET BY MOUTH EVERY DAY (Patient taking differently: Take 10 mg by mouth daily. )  . Bioflavonoid Products (SUPER-C 1000 PO) Take by mouth daily.  . diphenhydrAMINE (EQ ALLERGY RELIEF) 25 MG tablet Take 25 mg by mouth every 6 (six) hours as needed for allergies.  . furosemide (LASIX) 40 MG tablet Take 1 tablet (40 mg total) by mouth daily.  . lansoprazole (PREVACID) 15 MG capsule Take 15 mg by mouth daily at 12 noon.  . metoprolol succinate (TOPROL-XL) 50 MG 24 hr tablet Take 1 tablet (50 mg total) by mouth 2 (two) times a day. Take with or immediately following a meal.  . nitroGLYCERIN (NITROSTAT) 0.4 MG SL tablet Place 1 tablet (0.4 mg total) under the tongue every 5 (five) minutes as needed for chest pain.  Marland Kitchen spironolactone (ALDACTONE) 25 MG tablet Take 1 tablet (25 mg total) by mouth daily.  Marland Kitchen venlafaxine (EFFEXOR) 75 MG tablet Take 75 mg by mouth at bedtime.  Alveda Reasons 15 MG TABS tablet TAKE 1 TABLET (15 MG TOTAL) BY MOUTH DAILY WITH SUPPER.    Cardiac Studies:   Lexiscan Myoview stress test 06/07/2019: Lexiscan stress test was with 2-day protocol was performed. Stress EKG is non-diagnostic, as this is pharmacological stress test. Rest and stress EKG reveal sinus rhythm and left bundle branch block.Tissue attenuation seen in both rest and stress images, owing to large body habitus (313 lb). SPECT images reveal small area of mild intensity perfusion defect in inferior/inferolateral myocardium. Decreased global myocardial wall thickening and wall motion. Stress LVEF 27%. TID is 1.21. High risk study.   Coronary angiogram 09/20/2014:  No significant coronary artery disease. Left dominant  circulation. Ascending aortogram normal. Anterior origin of a small RCA. LV not evaluated due to inability to cross AV  Echo 04/22/2019:  1. The left ventricle has moderately reduced systolic function, with an ejection fraction of 35-40%. The cavity size was normal. Left ventricular diastolic Doppler parameters are consistent with impaired relaxation.  2. Right atrial size was mildly dilated.  3. The aortic valve is grossly normal. Mild-moderate stenosis of the aortic valve. Moderate aortic annular calcification noted.  4. The mitral valve was not well visualized. There is moderate mitral annular calcification present. No evidence of mitral valve stenosis.  5. The aorta is normal unless otherwise noted.  6. The aortic root and ascending aorta are normal in size and structure.  7. Technically difficult echo with poor image quality.  8. The atrial septum is grossly normal.   Assessment     ICD-10-CM   1. Paroxysmal atrial fibrillation (HCC)  I48.0 EKG 12-Lead  2. Chronic combined systolic and diastolic heart failure (HCC)  I50.42   3. Abnormal nuclear stress test  R94.39   4. Pain and swelling of left lower leg  M79.662 VAS [place] LOWER EXTREMITY VENOUS (DVT)   M79.89     Orders Placed This Encounter  Procedures  . EKG 12-Lead   EKG 10/20/2019: Probable Sinus bradycardia at 58 bpm with 1 PAC, normal axis, LBBB, no further analysis due to LBBB  EKG 03/05/2019 in ER: A fib with RVR at 138 bpm, LBBB    Recommendations:   Lorraine Harris  is a 69 y.o. female  with  morbid obesity, H/O non ischemic cardiomyopathy with normal coronary arteries by angio on 09/20/2014, HTN, bronchial asthma, and depression. PAF found in August 2020 after presenting with symptoms of angina, recently had abnormal nuclear stress test in which she was scheduled for coronary angiogram; however, later cancelled as she was feeling well. She has chronic shortness breath and dyspnea on exertion due to obesity and also  prior history of tobacco use which she has quit.   Since last seen by me 3 months ago, she has been doing well.  States that her dyspnea on exertion has been stable.  She has not had any episodes of angina.  Did have one episode of heart racing that lasted for approximately 30 minutes, presumably A. Fib, but states that occurrence of these episodes has decreased over the last several months.  She does have abnormal nuclear stress test, which I had previously recommended coronary angiogram given her stress test findings and new onset A. Fib, but as she feels that her symptoms have improved over the last several months, wishes to continue to hold off on procedure at this time.  We will continue with aggressive medical management.  She has been unable to tolerate Entresto due to soft blood pressure.  We will continue with metoprolol.    Except for left leg swelling, she does not appear to be significantly fluid overloaded.  I have encouraged her to take extra dose of her Lasix for the next few days given her left leg swelling from her injury.  She does have significant tenderness and ecchymosis present to her left leg from her fall.  She is also mostly sedentary.  Do feel that she should have lower extremity venous duplex to rule out DVT as she is high risk for DVT given her sedentary lifestyle.  She is on Xarelto for anticoagulation.  We will continue with this.  I will notify her of lower extremity duplex results.  She is due for follow-up with her PCP, which I have encouraged her to do so.  Will have labs performed at that time.  She also continues to suffer from depression surrounding her mother's death, which I have encouraged her to discuss with her PCP.  I have strongly encouraged her again to continue to work on her weight loss which is vitally important in controlling her risk factors.  We will plan to see her back in 2 months for follow-up and close monitoring, but encouraged her to contact us sooner if  needed.   Miquel Dunn, MSN, APRN, FNP-C [facility]. [place] Office: [phone] Fax: [phone]

## 2019-10-22 ENCOUNTER — Ambulatory Visit (HOSPITAL_COMMUNITY): Payer: Medicare HMO

## 2019-10-26 ENCOUNTER — Ambulatory Visit (HOSPITAL_COMMUNITY)
Admission: RE | Admit: 2019-10-26 | Discharge: 2019-10-26 | Disposition: A | Discharge status: Home / Self Care | Payer: Medicare HMO | Source: Ambulatory Visit | Attending: Cardiology | Admitting: Cardiology

## 2019-10-26 DIAGNOSIS — M7989 Other specified soft tissue disorders: Secondary | ICD-10-CM | POA: Insufficient documentation

## 2019-10-26 DIAGNOSIS — M79662 Pain in left lower leg: Secondary | ICD-10-CM

## 2019-10-27 NOTE — Progress Notes (Signed)
Called pt to inform her about her results. Pt understood  

## 2019-11-15 ENCOUNTER — Ambulatory Visit: Payer: Medicare HMO | Admitting: Family Medicine

## 2019-11-15 ENCOUNTER — Other Ambulatory Visit: Payer: Self-pay

## 2019-11-15 ENCOUNTER — Encounter: Payer: Self-pay | Admitting: Family Medicine

## 2019-11-15 DIAGNOSIS — S8002XD Contusion of left knee, subsequent encounter: Secondary | ICD-10-CM | POA: Diagnosis not present

## 2019-11-15 NOTE — Progress Notes (Signed)
I saw and examined the patient with Dr. Mayer Masker and agree with assessment and plan as outlined.    Improving left lower leg pain.  Bactroban helping.  Still a pustule in lower part of anterior shin.  Photo taken for chart.  Knee has 1+ effusion with numbness over patella, but also improving.  Will return if worsens, or send a photo of skin through [place].

## 2019-11-15 NOTE — Progress Notes (Signed)
Lorraine Harris - 69 y.o. female MRN [id number]  Date of birth: Jan 19, 1951  Office Visit Note: Visit Date: 11/15/2019 PCP: Everardo Beals, NP Referred by: Everardo Beals, NP  Subjective: Chief Complaint  Patient presents with  . Left Knee - Pain, Follow-up    Left leg was blistering and draining puss, saw Dr. Lorin Mercy last week wasn't impressed, had xrays prior, had itching, has had some swelling, but better today and states looks the best today it has since the fall. Was bruised all the way around the knee.   HPI: Lorraine Harris is a 69 y.o. female who comes in today for follow up of left knee pain and leg blistering. She was seen by Dr. Lorin Mercy on 2/16 after a fall on 2/11, hitting the front of her left knee. She had bruising and blisters on anterior knee and tibia. She had dopplers performed to evaluate for DVT given extent of swelling down to ankle but they were negative.  She reports that swelling has improved, pain has improved, and her blistering has improved. Was draining pus and has been using mupirocin 2% over area with improvement.   H/o heart failure, on xarelto and lasix.     ROS Otherwise per HPI.  Assessment & Plan: Visit Diagnoses:  1. Contusion of left knee, subsequent encounter     Plan:  Knee improving s/p contusion; bruising has improved and patient has full ROM in knee. Swelling and blistering on anterior tibia seems consistent with MRSA infection which is improving with mupirocin. Discussed returning for drainage and injection of knee if needed in the future but today she reports that pain has improved.   Meds & Orders: No orders of the defined types were placed in this encounter.  No orders of the defined types were placed in this encounter.   Follow-up: No follow-ups on file.   Procedures: No procedures performed  No notes on file   Clinical History: No specialty comments available.   She reports that she quit smoking about 9 years ago. She  has a 30.00 pack-year smoking history. She has never used smokeless tobacco. No results for input(s): HGBA1C, LABURIC in the last 8760 hours.  Objective:  VS:  HT:    WT:   BMI:     BP:   HR: bpm  TEMP: ( )  RESP:  Physical Exam  PHYSICAL EXAM: Gen: NAD, alert, cooperative with exam. Obese woman in wheelchair HEENT: clear conjunctiva,  CV:  no edema, capillary refill brisk, normal rate Resp: non-labored Skin: excoriated blisters on anterior shin. With pressure, one had pustular drainage Neuro: no gross deficits.  Psych:  alert and oriented  Ortho Exam  Left Knee: 1+ effusion TTP over patella as well as reported numbness Full ROM in knee Ligaments appear intact NVI     Imaging: No results found.  Past Medical/Family/Surgical/Social History: Medications & Allergies reviewed per EMR, new medications updated. Patient Active Problem List   Diagnosis Date Noted  . Contusion of left knee 10/19/2019  . Chronic combined systolic and diastolic heart failure ([place]) 12/17/2018  . Mixed hyperlipidemia 12/17/2018  . Dilated cardiomyopathy ([place]) 09/20/2014  . LBBB (left bundle branch block) 09/20/2014  . AORTIC VALVE DISORDERS 03/21/2008  . OTITIS EXTERNA, ACUTE, LEFT 03/14/2008  . COLONIC POLYPS, BENIGN 07/09/2007  . Morbid obesity ([place]) 07/09/2007  . DEPRESSION 07/09/2007  . MIGRAINE HEADACHE 07/09/2007  . Essential hypertension 07/09/2007  . ESOPHAGEAL STRICTURE 05/11/2001   Past Medical History:  Diagnosis Date  .  A-fib ([place])   . A-fib ([place])   . Asthma   . CHF (congestive heart failure) ([place])   . Chronic combined systolic and diastolic heart failure ([place]) 12/17/2018  . Hyperlipidemia   . Hypertension    Family History  Problem Relation Age of Onset  . Heart disease Mother   . Kidney disease Father   . Heart attack Father   . Heart disease Sister    Past Surgical History:  Procedure Laterality Date  . LEFT HEART CATHETERIZATION WITH CORONARY ANGIOGRAM N/A  09/20/2014   Procedure: LEFT HEART CATHETERIZATION WITH CORONARY ANGIOGRAM;  Surgeon: Laverda Page, MD;  Location: [facility] CATH LAB;  Service: Cardiovascular;  Laterality: N/A;   Social History   Occupational History  . Not on file  Tobacco Use  . Smoking status: Former Smoker    Packs/day: 1.00    Years: 30.00    Pack years: 30.00    Quit date: 2012    Years since quitting: 9.2  . Smokeless tobacco: Never Used  Substance and Sexual Activity  . Alcohol use: No  . Drug use: No  . Sexual activity: Not on file

## 2019-11-24 ENCOUNTER — Other Ambulatory Visit: Payer: Self-pay | Admitting: Cardiology

## 2019-11-25 ENCOUNTER — Other Ambulatory Visit: Payer: Self-pay | Admitting: Cardiology

## 2019-11-30 ENCOUNTER — Encounter: Payer: Self-pay | Admitting: Internal Medicine

## 2019-11-30 ENCOUNTER — Ambulatory Visit: Payer: Medicare HMO | Admitting: Internal Medicine

## 2019-11-30 ENCOUNTER — Other Ambulatory Visit: Payer: Self-pay

## 2019-11-30 VITALS — BP 120/66 | HR 53 | Temp 98.1°F | Ht 64.0 in | Wt 344.0 lb

## 2019-11-30 DIAGNOSIS — I5022 Chronic systolic (congestive) heart failure: Secondary | ICD-10-CM | POA: Diagnosis not present

## 2019-11-30 DIAGNOSIS — Z87891 Personal history of nicotine dependence: Secondary | ICD-10-CM | POA: Diagnosis not present

## 2019-11-30 DIAGNOSIS — R0602 Shortness of breath: Secondary | ICD-10-CM | POA: Diagnosis not present

## 2019-11-30 DIAGNOSIS — R9439 Abnormal result of other cardiovascular function study: Secondary | ICD-10-CM | POA: Diagnosis not present

## 2019-11-30 NOTE — Progress Notes (Signed)
Lorraine Harris    [id number]    [birth year]  Primary Care Physician:M[place]aps, Joelene Millin, NP  Referring Physician: Everardo Beals, NP [place],  [place] [id number] Reason for Consultation: Shortness of breath Date of Consultation: 11/30/2019  Chief complaint:   Chief Complaint  Patient presents with  . Consult     HPI: Lorraine Harris is a 69 y.o. woman with a history of obesity Body mass index is 59.05 kg/m. and heart failure reduced ejection fraction EF 35-40%. She has paroxysmal A. Fib which is rate controlled and she is on xarelto. She had an abnormal stress test in September 2020 and cancelled her heart catheterization due to covid and she was very scared and nervous. She continues to have ongoing shortness of breath and jaw pain but has not mentioned this to her cardiology team.   She is here because she went to urgent care for dyspnea and was referred here for ongoing evaluation.  Dyspnea with minimal exertion (walking down the hallway,) bending forward, even standing up from her chair. Was given an albuterol inhaler when she had bronchitis a couple years ago.  She takes it now for dyspnea 3-4 times/day this does help her dyspnea. After she quit smoking ten years ago she stopped having recurrent bronchitis.   Feels like she's at a low point in her life due to losing her mother a year ago and feeling depressed. Feels that her weight is a big component of her dyspnea because she feels sob when she leans forward as well.    She would like to join a gym and get back into a regular fitness routine.  Takes xanax to help her sleep and is also on wellbutrin.   OBSTRUCTIVE SLEEP APNEA SCREENING  1.  Snoring?:  yes 2.  Tired?:  yes 3.  Observed apnea, stop breathing or choking/gasping during sleep?:  no 4.  Pressure. HTN history?  yes 5.  BMI more than 35 kg/m2?  yes 6.  Age more than 10 yrs?  yes 7.  Neck size larger than 17 in for female or 16  in for female?  yes 8.  Gender = Female?  no  Total:  4  For general population  OSA - Low Risk : Yes to 0 - 2 questions OSA - Intermediate Risk : Yes to 3 - 4 questions OSA - High Risk : Yes to 5 - 8 questions  or Yes to 2 or more of 4 STOP questions + female gender or Yes to 2 or more of 4 STOP questions + BMI > 35kg/m2  or Yes to 2 or more of 4 STOP questions + neck circumference 17 inches / 43cm in female or 16 inches / 41cm in female  References: Rinaldo Cloud al. Anesthesiology 2008; 108: 812-821,  Gabriel Cirri et al Br Dara Hoyer 2012; 108: [id number],  Gabriel Cirri et al J Clin Sleep Med Sept 2014.   Social history: Lives at home with her sister. Minimally active. Spends most of the day in a life chair or wheel chair.  Smoking history: 30pack years, quit 2012.  Social History   Occupational History  . Not on file  Tobacco Use  . Smoking status: Former Smoker    Packs/day: 1.00    Years: 30.00    Pack years: 30.00    Quit date: 2012    Years since quitting: 9.2  . Smokeless tobacco: Never Used  Substance and  Sexual Activity  . Alcohol use: No  . Drug use: No  . Sexual activity: Not on file    Relevant family history:  Family History  Problem Relation Age of Onset  . Heart disease Mother   . Kidney disease Father   . Heart attack Father   . Sleep apnea Father   . Heart disease Sister     Past Medical History:  Diagnosis Date  . A-fib ([place])   . A-fib ([place])   . Asthma   . CHF (congestive heart failure) ([place])   . Chronic combined systolic and diastolic heart failure ([place]) 12/17/2018  . Hyperlipidemia   . Hypertension     Past Surgical History:  Procedure Laterality Date  . LEFT HEART CATHETERIZATION WITH CORONARY ANGIOGRAM N/A 09/20/2014   Procedure: LEFT HEART CATHETERIZATION WITH CORONARY ANGIOGRAM;  Surgeon: Laverda Page, MD;  Location[facility] C[facility] LAB;  Service: Cardiovascular;  Laterality: N/A;     Review of systems: Review of Systems  Constitutional: Positive  for malaise/fatigue. Negative for chills, fever and weight loss.  HENT: Negative for congestion, sinus pain and sore throat.   Eyes: Negative for discharge and redness.  Respiratory: Positive for shortness of breath. Negative for cough, hemoptysis, sputum production and wheezing.   Cardiovascular: Positive for palpitations and leg swelling. Negative for chest pain.  Gastrointestinal: Positive for heartburn. Negative for nausea and vomiting.  Musculoskeletal: Negative for joint pain and myalgias.  Skin: Negative for rash.  Neurological: Negative for dizziness, tremors, focal weakness and headaches.  Endo/Heme/Allergies: Negative for environmental allergies.  Psychiatric/Behavioral: Positive for depression. The patient is nervous/anxious.   All other systems reviewed and are negative.   Physical Exam: Blood pressure 120/66, pulse (!) 53, temperature 98.1 F (36.7 C), temperature source Temporal, height 5\' 4"  (1.626 m), weight (!) 344 lb (156 kg), SpO2 96 %. Gen:      No acute distress ENT:  mallampati IV, mmm Lungs:   Diminished, No increased respiratory effort, symmetric chest wall excursion, clear to auscultation bilaterally, no wheezes or crackles CV:         Regular rate and rhythm; no murmurs, rubs, or gallops. 2+ non pitting edema Abd:      Obsese, soft Neuro: normal speech, no focal facial asymmetry Psych: alert and oriented x3, flat affect, tearful   Data Reviewed/Medical Decision Making:  Independent interpretation of tests: Imaging: . Review of patient's chest xray images revealed no pleural effusion or pneumonia (July 2020). The patient's images have been independently reviewed by me.    Lexiscan done September 2020 shows high risk study with perfusion defect noted in the inferior/inferiorlateral myocardium.   Echo 04/2019 1. The left ventricle has moderately reduced systolic function, with an  ejection fraction of 35-40%. The cavity size was normal. Left ventricular   diastolic Doppler parameters are consistent with impaired relaxation.  2. Right atrial size was mildly dilated.  3. The aortic valve is grossly normal. Mild-moderate stenosis of the  aortic valve. Moderate aortic annular calcification noted.  4. The mitral valve was not well visualized. There is moderate mitral  annular calcification present. No evidence of mitral valve stenosis.  5. The aorta is normal unless otherwise noted.  6. The aortic root and ascending aorta are normal in size and structure.  7. Technically difficult echo with poor image quality.  8. The atrial septum is grossly normal.   PFTs: Never had  Labs:  BNP not elevated  Immunization status:   There is no immunization  history on file for this patient.  . I reviewed prior external note(s) from Cardiology, urgent care . I reviewed the result(s) of the labs and imaging as noted above.  . 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 OSA  Plan/Recommendations: Dyspnea is likely multifactorial from obesity, CAD with abnormal stress test, obesity, possible smoking related lung disease, suspected OSA.  Highest priority is addressing her high risk abnormal stress test - we talked about this at length today. She is now prepared to undergo heart catheterization. Would also address her likely OSA with a sleep study - we discussed this entails going to sleep in a sleep lab. Will order titration study with high suspicion for OSA. Will send a message to her cardiology team regarding need for scheduling [facility].   After these are completed can pursue pulmonary work up as needed. Can continue prn albuterol for now. I will see her back in 2 months.  We discussed disease management and progression at length today.    Return to Care: Return in about 2 months  (around 01/30/2020).  Lenice Llamas, MD [facility] [place]  CC: Everardo Beals, NP

## 2019-11-30 NOTE — Patient Instructions (Signed)
The patient should have follow up scheduled with myself in 2 months.   Prior to next visit patient should have: Heart Catheterization with Dr. Einar Gip Sleep Study

## 2019-12-20 ENCOUNTER — Encounter: Payer: Self-pay | Admitting: Cardiology

## 2019-12-20 ENCOUNTER — Other Ambulatory Visit: Payer: Self-pay

## 2019-12-20 ENCOUNTER — Ambulatory Visit: Payer: Medicare HMO | Admitting: Cardiology

## 2019-12-20 VITALS — BP 117/47 | HR 56 | Temp 98.2°F | Resp 16 | Ht 64.0 in | Wt 339.0 lb

## 2019-12-20 DIAGNOSIS — I10 Essential (primary) hypertension: Secondary | ICD-10-CM

## 2019-12-20 DIAGNOSIS — Z6841 Body Mass Index (BMI) 40.0 and over, adult: Secondary | ICD-10-CM

## 2019-12-20 DIAGNOSIS — I5042 Chronic combined systolic (congestive) and diastolic (congestive) heart failure: Secondary | ICD-10-CM

## 2019-12-20 DIAGNOSIS — I480 Paroxysmal atrial fibrillation: Secondary | ICD-10-CM

## 2019-12-20 NOTE — Progress Notes (Signed)
Primary Physician/Referring:  Everardo Beals, NP  Patient ID: Lorraine Harris, female    DOB: 1951-03-28, 69 y.o.   MRN: [id number]  Chief Complaint  Patient presents with  . Follow-up  . Congestive Heart Failure     HPI: Lorraine Harris  is a 69 y.o. female morbid obesity, H/O non ischemic cardiomyopathy with normal coronary arteries by angio on 09/20/2014, EF 40%, LBBB,  HTN, bronchial asthma, and depression. PAF found in August 2020 after presenting with symptoms of angina, mildly abnormal nuclear stress test in 2020. She has chronic shortness breath and dyspnea on exertion due to obesity and also prior history of tobacco use which she has quit. She has been unable to tolerate Entresto due to soft blood pressure and is tolerating metoprolol.   She is here for 3 month follow up of CHF and leg edema. She fell after tripping over some shoes and has injured her left leg 3 months ago with extensive bruising and edema.  Dyspnea is remained stable, no PND orthopnea, no palpitations. No heart racing.  Fortunately she has healed well and presently doing well and states that she has been kind to push herself at home with help of a walker and has started to lose weight, has lost close to 19 pounds in the past 3 months.  She is also watching her diet closely.  Denies PND or orthopnea.  No chest pain even when she pushes herself to walk.  Past Medical History:  Diagnosis Date  . A-fib ([place])   . A-fib ([place])   . Asthma   . CHF (congestive heart failure) ([place])   . Chronic combined systolic and diastolic heart failure ([place]) 12/17/2018  . Hyperlipidemia   . Hypertension     Past Surgical History:  Procedure Laterality Date  . LEFT HEART CATHETERIZATION WITH CORONARY ANGIOGRAM N/A 09/20/2014   Procedure: LEFT HEART CATHETERIZATION WITH CORONARY ANGIOGRAM;  Surgeon: Laverda Page, MD;  Location[facility] CATH LAB;  Service: Cardiovascular;  Laterality: N/A;   Social History   Tobacco Use    . Smoking status: Former Smoker    Packs/day: 1.00    Years: 30.00    Pack years: 30.00    Quit date: 2012    Years since quitting: 9.3  . Smokeless tobacco: Never Used  Substance Use Topics  . Alcohol use: No   Marital Status: Divorced   Review of Systems  Constitution: Positive for weight loss (intentional).  Eyes: Positive for visual halos.  Cardiovascular: Positive for dyspnea on exertion and leg swelling. Negative for chest pain.  Musculoskeletal: Positive for arthritis and back pain.  Gastrointestinal: Negative for melena.  Psychiatric/Behavioral: Positive for depression. The patient has insomnia.    Objective  Blood pressure (!) 117/47, pulse (!) 56, temperature 98.2 F (36.8 C), temperature source Temporal, resp. rate 16, height 5\' 4"  (1.626 m), weight (!) 339 lb (153.8 kg), SpO2 94 %. Body mass index is 58.19 kg/m.   Vitals with BMI 12/20/2019 12/20/2019 11/30/2019  Height - 5\' 4"  5\' 4"   Weight - 339 lbs 344 lbs  BMI - [id number]  Systolic [id number]  Diastolic 47 47 66  Pulse - 56 53      Physical Exam  Constitutional: No distress.  Moderately built and morbidly obese  Neck:  Short neck and difficult to evaluate JVP  Cardiovascular: Normal rate, regular rhythm and intact distal pulses. Exam reveals no gallop.  Murmur heard.  Harsh midsystolic murmur is present  with a grade of 3/6 at the upper right sternal border radiating to the neck. Pulses:      Carotid pulses are 2+ on the right side with bruit and 2+ on the left side. Femoral and popliteal pulse difficult to feel due to patient's body habitus.  Pedal pulse 1 bilateral 2 + pitting leg edema present.  Pulmonary/Chest: Effort normal. No respiratory distress. She has wheezes.  Abdominal: Soft. Bowel sounds are normal.  Obese. Pannus present   Radiology: No results found.  Laboratory examination:    CMP Latest Ref Rng & Units 08/10/2019 05/28/2019 03/05/2019  Glucose 65 - 99 mg/dL 98 117(H) 98  BUN 8 -  27 mg/dL 18 18 18   Creatinine 0.57 - 1.00 mg/dL 0.87 1.03(H) 1.13(H)  Sodium 134 - 144 mmol/L 141 139 139  Potassium 3.5 - 5.2 mmol/L 4.7 4.6 4.4  Chloride 96 - 106 mmol/L 99 100 100  CO2 20 - 29 mmol/L 27 24 29   Calcium 8.7 - 10.3 mg/dL 10.5(H) 8.9 9.0  Total Protein 6.5 - 8.1 g/dL - - -  Total Bilirubin 0.3 - 1.2 mg/dL - - -  Alkaline Phos 38 - 126 U/L - - -  AST 15 - 41 U/L - - -  ALT 0 - 44 U/L - - -   CBC Latest Ref Rng & Units 08/10/2019 03/08/2019 03/05/2019  WBC 3.4 - 10.8 x10E3/uL 8.5 7.2 7.9  Hemoglobin 11.1 - 15.9 g/dL 13.5 13.4 14.7  Hematocrit 34.0 - 46.6 % 41.4 42.8 46.9(H)  Platelets 150 - 450 x10E3/uL 286 273 282   Lipid Panel     Component Value Date/Time   CHOL 126 03/08/2019 1052   TRIG 105 03/08/2019 1052   HDL 40 03/08/2019 1052   LDLCALC 65 03/08/2019 1052   HEMOGLOBIN A1C No results found for: HGBA1C, MPG TSH Recent Labs    03/05/19 1400  TSH 5.081*    Medications   Medications Discontinued During This Encounter  Medication Reason  . Bioflavonoid Products (SUPER-C 1000 PO) Patient Preference  . lansoprazole (PREVACID) 15 MG capsule Discontinued by provider   Current Meds  Medication Sig  . albuterol (PROVENTIL HFA;VENTOLIN HFA) 108 (90 BASE) MCG/ACT inhaler Inhale 1 puff into the lungs every 6 (six) hours as needed for wheezing or shortness of breath.  . ALPRAZolam (XANAX) 0.5 MG tablet Take 0.5 mg by mouth 3 (three) times daily as needed for anxiety.   Marland Kitchen atorvastatin (LIPITOR) 10 MG tablet TAKE 1 TABLET BY MOUTH EVERY DAY (Patient taking differently: Take 10 mg by mouth daily. )  . buPROPion (ZYBAN) 150 MG 12 hr tablet Take 150 mg by mouth daily.  . diphenhydrAMINE (EQ ALLERGY RELIEF) 25 MG tablet Take 25 mg by mouth every 6 (six) hours as needed for allergies.  . furosemide (LASIX) 40 MG tablet Take 1 tablet (40 mg total) by mouth daily.  . metoprolol succinate (TOPROL-XL) 50 MG 24 hr tablet TAKE 1 TABLET (50 MG TOTAL) BY MOUTH 2 (TWO) TIMES A  DAY. TAKE WITH OR IMMEDIATELY FOLLOWING A MEAL.  . nitroGLYCERIN (NITROSTAT) 0.4 MG SL tablet Place 1 tablet (0.4 mg total) under the tongue every 5 (five) minutes as needed for chest pain.  Marland Kitchen spironolactone (ALDACTONE) 25 MG tablet Take 1 tablet (25 mg total) by mouth daily.  Marland Kitchen venlafaxine (EFFEXOR) 75 MG tablet Take 75 mg by mouth at bedtime.  Alveda Reasons 15 MG TABS tablet TAKE 1 TABLET (15 MG TOTAL) BY MOUTH DAILY WITH SUPPER.  Cardiac Studies:   Coronary angiogram 09/20/2014: No significant coronary artery disease. Left dominant circulation. Ascending aortogram normal. Anterior origin of a small RCA. LV not evaluated due to inability to cross AV  Lexiscan Myoview stress test 06/07/2019: Lexiscan stress test was with 2-day protocol was performed. Stress EKG is non-diagnostic, as this is pharmacological stress test. Rest and stress EKG reveal sinus rhythm and left bundle branch block.Tissue attenuation seen in both rest and stress images, owing to large body habitus (313 lb). SPECT images reveal small area of mild intensity perfusion defect in inferior/inferolateral myocardium. Decreased global myocardial wall thickening and wall motion. Stress LVEF 27%. TID is 1.21. High risk study.   Echo 04/22/2019:  1. The left ventricle has moderately reduced systolic function, with an ejection fraction of 35-40%. The cavity size was normal. Left ventricular diastolic Doppler parameters are consistent with impaired relaxation.  2. Right atrial size was mildly dilated.  3. The aortic valve is grossly normal. Mild-moderate stenosis of the aortic valve. Moderate aortic annular calcification noted.  4. The mitral valve was not well visualized. There is moderate mitral annular calcification present. No evidence of mitral valve stenosis.  5. The aorta is normal unless otherwise noted.  6. The aortic root and ascending aorta are normal in size and structure.  7. Technically difficult echo with poor image  quality.  8. The atrial septum is grossly normal.  Lower Extremity Venous Duplex  10/26/2019: RIGHT: - No evidence of common femoral vein obstruction.    LEFT: - There is no evidence of deep vein thrombosis in the lower extremity.  - No cystic structure found in the popliteal fossa.  EKG:  EKG 10/20/2019: Probable Sinus bradycardia at 58 bpm with 1 PAC, normal axis, LBBB, no further analysis due to LBBB  EKG 03/05/2019 in ER: A fib with RVR at 138 bpm, LBBB  Assessment     ICD-10-CM   1. Chronic combined systolic and diastolic heart failure (HCC)  I50.42   2. Paroxysmal atrial fibrillation ([place]). CHA2DS2-VASc Score is 4.  Yearly risk of stroke: 4% (A, F, HTN, CHF). .  I48.0   3. Essential hypertension  I10   4. Class 3 severe obesity due to excess calories with serious comorbidity and body mass index (BMI) of 50.0 to 59.9 in adult [facility])  E66.01    Z68.43     No orders of the defined types were placed in this encounter.  Recommendations:   Lorraine Harris  is a 69 y.o. female  with  morbid obesity, H/O non ischemic cardiomyopathy with normal coronary arteries by angio on 09/20/2014, EF 40%, LBBB,  HTN, bronchial asthma, and depression. PAF found in August 2020 after presenting with symptoms of angina, mildly abnormal nuclear stress test in 2020. She has chronic shortness breath and dyspnea on exertion due to obesity and also prior history of tobacco use which she has quit. She has been unable to tolerate Entresto due to soft blood pressure and is tolerating metoprolol.   3 months ago she has had traumatic left leg edema, ecchymosis and blistering. DVT negative. She is presently on Xarelto for anticoagulation for paroxysmal atrial fibrillation.  Today she is not in any acute decompensated heart failure, last ejection fraction is remained stable over the years, and previously she has not had significant coronary disease, do not think she will need heart catheterization to exclude  progression of coronary artery disease.  She has been pushing herself to doing physical activity and in spite of  this she has not had any anginal symptoms.   She has lost about 19 pounds in weight for last 3 months and is progressively working hard at that and hence as she is not in decompensated heart failure, symptoms are stable, would prefer to continue medical therapy for now.  Reviewed her labs, lipid panel, CBC and renal function has remained stable over the past 3 months.  I will see her back in 3 months.   Adrian Prows, MD, [facility] 12/20/2019, 12:12 PM  [facility] Office: [phone]   CC: Lenice Llamas, MD (Pul)

## 2019-12-22 ENCOUNTER — Other Ambulatory Visit (HOSPITAL_COMMUNITY)
Admission: RE | Admit: 2019-12-22 | Discharge: 2019-12-22 | Disposition: A | Discharge status: Home / Self Care | Payer: Medicare HMO | Source: Ambulatory Visit | Attending: Pulmonary Disease | Admitting: Pulmonary Disease

## 2019-12-22 DIAGNOSIS — Z20822 Contact with and (suspected) exposure to covid-19: Secondary | ICD-10-CM | POA: Diagnosis not present

## 2019-12-22 DIAGNOSIS — Z01812 Encounter for preprocedural laboratory examination: Secondary | ICD-10-CM | POA: Diagnosis present

## 2019-12-22 LAB — SARS CORONAVIRUS 2 (TAT 6-24 HRS): SARS Coronavirus 2: NEGATIVE

## 2019-12-24 ENCOUNTER — Ambulatory Visit (HOSPITAL_BASED_OUTPATIENT_CLINIC_OR_DEPARTMENT_OTHER): Payer: Medicare HMO | Attending: Internal Medicine | Admitting: Pulmonary Disease

## 2019-12-27 ENCOUNTER — Other Ambulatory Visit: Payer: Self-pay | Admitting: Cardiology

## 2020-02-01 ENCOUNTER — Ambulatory Visit: Payer: Medicare HMO | Admitting: Internal Medicine

## 2020-03-20 ENCOUNTER — Ambulatory Visit: Payer: Medicare HMO | Admitting: Cardiology

## 2020-03-27 ENCOUNTER — Encounter: Payer: Self-pay | Admitting: Cardiology

## 2020-03-27 ENCOUNTER — Other Ambulatory Visit: Payer: Self-pay

## 2020-03-27 ENCOUNTER — Ambulatory Visit: Payer: Medicare HMO | Admitting: Cardiology

## 2020-03-27 VITALS — BP 114/64 | HR 50 | Resp 17 | Ht 64.0 in | Wt 330.0 lb

## 2020-03-27 DIAGNOSIS — R0989 Other specified symptoms and signs involving the circulatory and respiratory systems: Secondary | ICD-10-CM

## 2020-03-27 DIAGNOSIS — I447 Left bundle-branch block, unspecified: Secondary | ICD-10-CM

## 2020-03-27 DIAGNOSIS — E785 Hyperlipidemia, unspecified: Secondary | ICD-10-CM

## 2020-03-27 DIAGNOSIS — I480 Paroxysmal atrial fibrillation: Secondary | ICD-10-CM

## 2020-03-27 DIAGNOSIS — R7989 Other specified abnormal findings of blood chemistry: Secondary | ICD-10-CM

## 2020-03-27 DIAGNOSIS — I5042 Chronic combined systolic (congestive) and diastolic (congestive) heart failure: Secondary | ICD-10-CM

## 2020-03-27 DIAGNOSIS — I10 Essential (primary) hypertension: Secondary | ICD-10-CM

## 2020-03-27 NOTE — Progress Notes (Signed)
Primary Physician/Referring:  Everardo Beals, NP  Patient ID: Lorraine Harris, female    DOB: 09/14/1950, 69 y.o.   MRN: [id number]  Chief Complaint  Patient presents with  . Congestive Heart Failure  . Follow-up    3 month     HPI: Lorraine Harris  is a 69 y.o. female morbid obesity, H/O non ischemic cardiomyopathy with normal coronary arteries by angio on 09/20/2014, EF 40%, LBBB,  HTN, bronchial asthma, and depression. PAF found in August 2020 after presenting with symptoms of angina, mildly abnormal nuclear stress test in 2020. She has chronic shortness breath and dyspnea on exertion due to obesity and also prior history of tobacco use which she has quit. She has been unable to tolerate Entresto due to soft blood pressure and is tolerating metoprolol.   She is here for 6 month follow up of CHF and leg edema.  Dyspnea is remained stable, no PND orthopnea, no palpitations. She is also watching her diet closely.  She is tolerating Xarelto without any bleeding diathesis.  Past Medical History:  Diagnosis Date  . A-fib ([place])   . A-fib ([place])   . Asthma   . CHF (congestive heart failure) ([place])   . Chronic combined systolic and diastolic heart failure ([place]) 12/17/2018  . Hyperlipidemia   . Hypertension     Past Surgical History:  Procedure Laterality Date  . LEFT HEART CATHETERIZATION WITH CORONARY ANGIOGRAM N/A 09/20/2014   Procedure: LEFT HEART CATHETERIZATION WITH CORONARY ANGIOGRAM;  Surgeon: Laverda Page, MD;  Location: [facility] CATH LAB;  Service: Cardiovascular;  Laterality: N/A;   Social History   Tobacco Use  . Smoking status: Former Smoker    Packs/day: 1.00    Years: 30.00    Pack years: 30.00    Quit date: 2012    Years since quitting: 9.5  . Smokeless tobacco: Never Used  Substance Use Topics  . Alcohol use: No   Marital Status: Divorced   Review of Systems  Constitutional: Positive for weight loss (intentional).  Cardiovascular: Positive for  dyspnea on exertion and leg swelling. Negative for chest pain.  Musculoskeletal: Positive for arthritis and back pain.  Gastrointestinal: Negative for melena.  Psychiatric/Behavioral: Positive for depression. The patient has insomnia.    Objective  Blood pressure (!) 114/64, pulse 50, resp. rate 17, height 5\' 4"  (1.626 m), weight (!) 330 lb (149.7 kg), SpO2 97 %. Body mass index is 56.64 kg/m.   Vitals with BMI 03/27/2020 12/20/2019 12/20/2019  Height 5\' 4"  - 5\' 4"   Weight 330 lbs - 339 lbs  BMI 64.40 - 34.74  Systolic 259 563 875  Diastolic 64 47 47  Pulse 50 - 56      Physical Exam Constitutional:      General: She is not in acute distress.    Comments: Moderately built and morbidly obese  Neck:     Comments: Short neck and difficult to evaluate JVP Cardiovascular:     Rate and Rhythm: Normal rate and regular rhythm.     Pulses: Intact distal pulses.          Carotid pulses are 2+ on the right side with bruit and 2+ on the left side with bruit.      Dorsalis pedis pulses are 2+ on the right side and 2+ on the left side.       Posterior tibial pulses are 2+ on the right side and 2+ on the left side.     Heart  sounds: Murmur heard.  Harsh midsystolic murmur is present with a grade of 3/6 at the upper right sternal border radiating to the neck.  No gallop.      Comments: Femoral and popliteal pulse difficult to feel due to patient's body habitus.  Trace bilateral leg edema.  Pulmonary:     Effort: Pulmonary effort is normal. No respiratory distress.     Breath sounds: Wheezing present.  Abdominal:     General: Bowel sounds are normal.     Palpations: Abdomen is soft.     Comments: Obese. Pannus present    Radiology: No results found.  Laboratory examination:    CMP Latest Ref Rng & Units 08/10/2019 05/28/2019 03/05/2019  Glucose 65 - 99 mg/dL 98 117(H) 98  BUN 8 - 27 mg/dL 18 18 18   Creatinine 0.57 - 1.00 mg/dL 0.87 1.03(H) 1.13(H)  Sodium 134 - 144 mmol/L 141 139 139   Potassium 3.5 - 5.2 mmol/L 4.7 4.6 4.4  Chloride 96 - 106 mmol/L 99 100 100  CO2 20 - 29 mmol/L 27 24 29   Calcium 8.7 - 10.3 mg/dL 10.5(H) 8.9 9.0  Total Protein 6.5 - 8.1 g/dL - - -  Total Bilirubin 0.3 - 1.2 mg/dL - - -  Alkaline Phos 38 - 126 U/L - - -  AST 15 - 41 U/L - - -  ALT 0 - 44 U/L - - -   CBC Latest Ref Rng & Units 08/10/2019 03/08/2019 03/05/2019  WBC 3.4 - 10.8 x10E3/uL 8.5 7.2 7.9  Hemoglobin 11.1 - 15.9 g/dL 13.5 13.4 14.7  Hematocrit 34.0 - 46.6 % 41.4 42.8 46.9(H)  Platelets 150 - 450 x10E3/uL 286 273 282   Lipid Panel     Component Value Date/Time   CHOL 126 03/08/2019 1052   TRIG 105 03/08/2019 1052   HDL 40 03/08/2019 1052   LDLCALC 65 03/08/2019 1052   HEMOGLOBIN A1C No results found for: HGBA1C, MPG TSH No results for input(s): TSH in the last 8760 hours.  Medications   Medications Discontinued During This Encounter  Medication Reason  . nitroGLYCERIN (NITROSTAT) 0.4 MG SL tablet Patient Preference   Current Meds  Medication Sig  . albuterol (PROVENTIL HFA;VENTOLIN HFA) 108 (90 BASE) MCG/ACT inhaler Inhale 1 puff into the lungs every 6 (six) hours as needed for wheezing or shortness of breath.  . ALPRAZolam (XANAX) 0.5 MG tablet Take 0.5 mg by mouth 3 (three) times daily as needed for anxiety.   Marland Kitchen atorvastatin (LIPITOR) 10 MG tablet TAKE 1 TABLET BY MOUTH EVERY DAY (Patient taking differently: Take 10 mg by mouth daily. )  . buPROPion (ZYBAN) 150 MG 12 hr tablet Take 150 mg by mouth daily.  . diphenhydrAMINE (EQ ALLERGY RELIEF) 25 MG tablet Take 25 mg by mouth every 6 (six) hours as needed for allergies.  . furosemide (LASIX) 40 MG tablet Take 1 tablet (40 mg total) by mouth daily.  . metoprolol succinate (TOPROL-XL) 50 MG 24 hr tablet TAKE 1 TABLET (50 MG TOTAL) BY MOUTH 2 (TWO) TIMES A DAY. TAKE WITH OR IMMEDIATELY FOLLOWING A MEAL.  Marland Kitchen spironolactone (ALDACTONE) 25 MG tablet TAKE 1 TABLET BY MOUTH EVERY DAY IN THE MORNING  . venlafaxine (EFFEXOR) 75  MG tablet Take 75 mg by mouth at bedtime.  Alveda Reasons 15 MG TABS tablet TAKE 1 TABLET (15 MG TOTAL) BY MOUTH DAILY WITH SUPPER.    Cardiac Studies:   Coronary angiogram 09/20/2014: No significant coronary artery disease. Left dominant circulation. Ascending  aortogram normal. Anterior origin of a small RCA. LV not evaluated due to inability to cross AV  Lexiscan Myoview stress test 06/07/2019: Lexiscan stress test was with 2-day protocol was performed. Stress EKG is non-diagnostic, as this is pharmacological stress test. Rest and stress EKG reveal sinus rhythm and left bundle branch block.Tissue attenuation seen in both rest and stress images, owing to large body habitus (313 lb). SPECT images reveal small area of mild intensity perfusion defect in inferior/inferolateral myocardium. Decreased global myocardial wall thickening and wall motion. Stress LVEF 27%. TID is 1.21. High risk study.   Echo 04/22/2019:  1. The left ventricle has moderately reduced systolic function, with an ejection fraction of 35-40%. The cavity size was normal. Left ventricular diastolic Doppler parameters are consistent with impaired relaxation.  2. Right atrial size was mildly dilated.  3. The aortic valve is grossly normal. Mild-moderate stenosis of the aortic valve. Moderate aortic annular calcification noted.  4. The mitral valve was not well visualized. There is moderate mitral annular calcification present. No evidence of mitral valve stenosis.  5. The aorta is normal unless otherwise noted.  6. The aortic root and ascending aorta are normal in size and structure.  7. Technically difficult echo with poor image quality.  8. The atrial septum is grossly normal.  Lower Extremity Venous Duplex  10/26/2019: RIGHT: - No evidence of common femoral vein obstruction.  LEFT: - There is no evidence of deep vein thrombosis in the lower extremity.  - No cystic structure found in the popliteal fossa.  EKG:  EKG 10/20/2019:  Probable Sinus bradycardia at 58 bpm with 1 PAC, normal axis, LBBB, no further analysis due to LBBB  EKG 03/05/2019 in ER: A fib with RVR at 138 bpm, LBBB  Assessment     ICD-10-CM   1. Chronic combined systolic and diastolic heart failure (HCC)  I50.42 PCV ECHOCARDIOGRAM COMPLETE  2. Paroxysmal atrial fibrillation ([place]). CHA2DS2-VASc Score is 4.  Yearly risk of stroke: 4% (A, F, HTN, CHF). .  I48.0 CBC    COMPLETE METABOLIC PANEL WITH GFR    PCV ECHOCARDIOGRAM COMPLETE  3. LBBB (left bundle branch block)  I44.7   4. Essential hypertension  I10 TSH+T4F+T3Free  5. Mild hyperlipidemia  E78.5 Lipid Panel With LDL/HDL Ratio  6. Elevated TSH  R79.89 TSH+T4F+T3Free    Orders Placed This Encounter  Procedures  . CBC  . COMPLETE METABOLIC PANEL WITH GFR  . TSH+T4F+T3Free  . Lipid Panel With LDL/HDL Ratio  . PCV ECHOCARDIOGRAM COMPLETE    Standing Status:   Future    Standing Expiration Date:   03/27/2021   Recommendations:   Lorraine Harris  is a 69 y.o. female  with  morbid obesity, H/O non ischemic cardiomyopathy with normal coronary arteries by angio on 09/20/2014, EF 40%, LBBB,  HTN, bronchial asthma, and depression. PAF found in August 2020 after presenting with symptoms of angina, mildly abnormal nuclear stress test in 2020. She has chronic shortness breath and dyspnea on exertion due to obesity and also prior history of tobacco use which she has quit. She has been unable to tolerate Entresto due to soft blood pressure and is tolerating metoprolol.   This is her 51-month office visit, she has continued to lose weight, she has lost about close to 37 to 38 pounds in weight over the past 6 months to a year.  Today she is not in any acute decompensated heart failure.  I will obtain routine labs, previously she also has  had abnormal TSH hence will obtain complete thyroid function panel.  She is tolerating anticoagulation without any bleeding diathesis.  She is maintaining sinus rhythm on  auscultation, EKG was not performed today.  Positive reinforcement was given to the patient, I would like to repeat an echocardiogram although it has been fairly stable over years, suspect it may have improved with regard to LVEF.   Schedule for carotid duplex for bruit/follow-up surveillance of carotid stenosis. OV in 6 months.    Lorraine Prows, MD, [facility] 03/27/2020, 4:30 PM Office: [phone]

## 2020-04-02 ENCOUNTER — Other Ambulatory Visit: Payer: Self-pay | Admitting: Cardiology

## 2020-04-03 ENCOUNTER — Other Ambulatory Visit: Payer: Medicare HMO

## 2020-04-04 ENCOUNTER — Other Ambulatory Visit: Payer: Self-pay

## 2020-04-04 ENCOUNTER — Other Ambulatory Visit: Payer: Medicare HMO

## 2020-04-04 DIAGNOSIS — Z20822 Contact with and (suspected) exposure to covid-19: Secondary | ICD-10-CM

## 2020-04-05 LAB — NOVEL CORONAVIRUS, NAA: SARS-CoV-2, NAA: NOT DETECTED

## 2020-04-05 LAB — SARS-COV-2, NAA 2 DAY TAT

## 2020-04-07 ENCOUNTER — Other Ambulatory Visit: Payer: Self-pay | Admitting: Cardiology

## 2020-05-04 ENCOUNTER — Other Ambulatory Visit: Payer: Self-pay | Admitting: Cardiology

## 2020-05-04 DIAGNOSIS — E782 Mixed hyperlipidemia: Secondary | ICD-10-CM

## 2020-05-30 ENCOUNTER — Other Ambulatory Visit: Payer: Self-pay | Admitting: Cardiology

## 2020-05-30 DIAGNOSIS — I480 Paroxysmal atrial fibrillation: Secondary | ICD-10-CM

## 2020-07-04 ENCOUNTER — Other Ambulatory Visit: Payer: Self-pay | Admitting: Cardiology

## 2020-08-17 ENCOUNTER — Other Ambulatory Visit: Payer: Self-pay | Admitting: Cardiology

## 2020-08-17 DIAGNOSIS — E782 Mixed hyperlipidemia: Secondary | ICD-10-CM

## 2020-09-02 ENCOUNTER — Other Ambulatory Visit: Payer: Self-pay | Admitting: Cardiology

## 2020-09-19 ENCOUNTER — Other Ambulatory Visit: Payer: Medicare HMO

## 2020-09-22 ENCOUNTER — Other Ambulatory Visit: Payer: Self-pay | Admitting: Cardiology

## 2020-09-22 DIAGNOSIS — I480 Paroxysmal atrial fibrillation: Secondary | ICD-10-CM

## 2020-09-22 DIAGNOSIS — R0989 Other specified symptoms and signs involving the circulatory and respiratory systems: Secondary | ICD-10-CM

## 2020-09-22 DIAGNOSIS — I5042 Chronic combined systolic (congestive) and diastolic (congestive) heart failure: Secondary | ICD-10-CM

## 2020-09-27 ENCOUNTER — Ambulatory Visit: Payer: Medicare HMO | Admitting: Cardiology

## 2020-10-06 ENCOUNTER — Inpatient Hospital Stay (HOSPITAL_COMMUNITY)
Admission: EM | Admit: 2020-10-06 | Discharge: 2020-10-13 | DRG: 177 | Disposition: A | Discharge status: Home Health | Payer: Medicare HMO | Attending: Family Medicine | Admitting: Family Medicine

## 2020-10-06 ENCOUNTER — Other Ambulatory Visit: Payer: Self-pay

## 2020-10-06 ENCOUNTER — Emergency Department (HOSPITAL_COMMUNITY): Payer: Medicare HMO

## 2020-10-06 ENCOUNTER — Encounter (HOSPITAL_COMMUNITY): Payer: Self-pay | Admitting: Emergency Medicine

## 2020-10-06 DIAGNOSIS — F419 Anxiety disorder, unspecified: Secondary | ICD-10-CM | POA: Diagnosis present

## 2020-10-06 DIAGNOSIS — U071 COVID-19: Principal | ICD-10-CM | POA: Diagnosis present

## 2020-10-06 DIAGNOSIS — F32A Depression, unspecified: Secondary | ICD-10-CM | POA: Diagnosis present

## 2020-10-06 DIAGNOSIS — I10 Essential (primary) hypertension: Secondary | ICD-10-CM | POA: Diagnosis present

## 2020-10-06 DIAGNOSIS — G4700 Insomnia, unspecified: Secondary | ICD-10-CM | POA: Diagnosis present

## 2020-10-06 DIAGNOSIS — I130 Hypertensive heart and chronic kidney disease with heart failure and stage 1 through stage 4 chronic kidney disease, or unspecified chronic kidney disease: Secondary | ICD-10-CM | POA: Diagnosis present

## 2020-10-06 DIAGNOSIS — E782 Mixed hyperlipidemia: Secondary | ICD-10-CM | POA: Diagnosis present

## 2020-10-06 DIAGNOSIS — T380X5A Adverse effect of glucocorticoids and synthetic analogues, initial encounter: Secondary | ICD-10-CM | POA: Diagnosis not present

## 2020-10-06 DIAGNOSIS — I4892 Unspecified atrial flutter: Secondary | ICD-10-CM | POA: Diagnosis present

## 2020-10-06 DIAGNOSIS — J1282 Pneumonia due to coronavirus disease 2019: Secondary | ICD-10-CM | POA: Diagnosis present

## 2020-10-06 DIAGNOSIS — I420 Dilated cardiomyopathy: Secondary | ICD-10-CM | POA: Diagnosis present

## 2020-10-06 DIAGNOSIS — Z87891 Personal history of nicotine dependence: Secondary | ICD-10-CM

## 2020-10-06 DIAGNOSIS — L03115 Cellulitis of right lower limb: Secondary | ICD-10-CM | POA: Diagnosis present

## 2020-10-06 DIAGNOSIS — I4819 Other persistent atrial fibrillation: Secondary | ICD-10-CM | POA: Diagnosis present

## 2020-10-06 DIAGNOSIS — Z79899 Other long term (current) drug therapy: Secondary | ICD-10-CM

## 2020-10-06 DIAGNOSIS — Z7901 Long term (current) use of anticoagulants: Secondary | ICD-10-CM

## 2020-10-06 DIAGNOSIS — I428 Other cardiomyopathies: Secondary | ICD-10-CM | POA: Diagnosis present

## 2020-10-06 DIAGNOSIS — Z6841 Body Mass Index (BMI) 40.0 and over, adult: Secondary | ICD-10-CM

## 2020-10-06 DIAGNOSIS — N1832 Chronic kidney disease, stage 3b: Secondary | ICD-10-CM | POA: Diagnosis present

## 2020-10-06 DIAGNOSIS — J9601 Acute respiratory failure with hypoxia: Secondary | ICD-10-CM | POA: Diagnosis present

## 2020-10-06 DIAGNOSIS — I5023 Acute on chronic systolic (congestive) heart failure: Secondary | ICD-10-CM | POA: Diagnosis present

## 2020-10-06 DIAGNOSIS — J45909 Unspecified asthma, uncomplicated: Secondary | ICD-10-CM | POA: Diagnosis present

## 2020-10-06 DIAGNOSIS — I4891 Unspecified atrial fibrillation: Secondary | ICD-10-CM | POA: Diagnosis not present

## 2020-10-06 DIAGNOSIS — E875 Hyperkalemia: Secondary | ICD-10-CM | POA: Diagnosis not present

## 2020-10-06 DIAGNOSIS — E1122 Type 2 diabetes mellitus with diabetic chronic kidney disease: Secondary | ICD-10-CM | POA: Diagnosis present

## 2020-10-06 DIAGNOSIS — L03116 Cellulitis of left lower limb: Secondary | ICD-10-CM | POA: Diagnosis present

## 2020-10-06 LAB — CBC WITH DIFFERENTIAL/PLATELET
Abs Immature Granulocytes: 0.02 10*3/uL (ref 0.00–0.07)
Basophils Absolute: 0.1 10*3/uL (ref 0.0–0.1)
Basophils Relative: 1 %
Eosinophils Absolute: 0.3 10*3/uL (ref 0.0–0.5)
Eosinophils Relative: 4 %
HCT: 43.3 % (ref 36.0–46.0)
Hemoglobin: 13.3 g/dL (ref 12.0–15.0)
Immature Granulocytes: 0 %
Lymphocytes Relative: 21 %
Lymphs Abs: 1.8 10*3/uL (ref 0.7–4.0)
MCH: 25.6 pg — ABNORMAL LOW (ref 26.0–34.0)
MCHC: 30.7 g/dL (ref 30.0–36.0)
MCV: 83.3 fL (ref 80.0–100.0)
Monocytes Absolute: 0.6 10*3/uL (ref 0.1–1.0)
Monocytes Relative: 8 %
Neutro Abs: 5.6 10*3/uL (ref 1.7–7.7)
Neutrophils Relative %: 66 %
Platelets: 445 10*3/uL — ABNORMAL HIGH (ref 150–400)
RBC: 5.2 MIL/uL — ABNORMAL HIGH (ref 3.87–5.11)
RDW: 19.8 % — ABNORMAL HIGH (ref 11.5–15.5)
WBC: 8.4 10*3/uL (ref 4.0–10.5)
nRBC: 0.6 % — ABNORMAL HIGH (ref 0.0–0.2)

## 2020-10-06 LAB — COMPREHENSIVE METABOLIC PANEL
ALT: 15 U/L (ref 0–44)
AST: 15 U/L (ref 15–41)
Albumin: 3.5 g/dL (ref 3.5–5.0)
Alkaline Phosphatase: 58 U/L (ref 38–126)
Anion gap: 14 (ref 5–15)
BUN: 29 mg/dL — ABNORMAL HIGH (ref 8–23)
CO2: 26 mmol/L (ref 22–32)
Calcium: 9.4 mg/dL (ref 8.9–10.3)
Chloride: 99 mmol/L (ref 98–111)
Creatinine, Ser: 1.39 mg/dL — ABNORMAL HIGH (ref 0.44–1.00)
GFR, Estimated: 41 mL/min — ABNORMAL LOW (ref >60)
Glucose, Bld: 115 mg/dL — ABNORMAL HIGH (ref 70–99)
Potassium: 4.2 mmol/L (ref 3.5–5.1)
Sodium: 139 mmol/L (ref 135–145)
Total Bilirubin: 0.3 mg/dL (ref 0.3–1.2)
Total Protein: 7.2 g/dL (ref 6.5–8.1)

## 2020-10-06 LAB — MAGNESIUM: Magnesium: 1.8 mg/dL (ref 1.7–2.4)

## 2020-10-06 LAB — D-DIMER, QUANTITATIVE: D-Dimer, Quant: 0.88 ug/mL-FEU — ABNORMAL HIGH (ref 0.00–0.50)

## 2020-10-06 MED ORDER — DILTIAZEM HCL 25 MG/5ML IV SOLN: 10.0000 mg | Freq: Once | INTRAVENOUS | Status: DC

## 2020-10-06 MED ORDER — IOHEXOL 350 MG/ML SOLN
75.0000 mL | Freq: Once | INTRAVENOUS | Status: AC | PRN
  Administered 2020-10-06: 100 mL via INTRAVENOUS

## 2020-10-06 MED ORDER — DILTIAZEM HCL-DEXTROSE 125-5 MG/125ML-% IV SOLN (PREMIX)
5.0000 mg/h | INTRAVENOUS | Status: DC
  Administered 2020-10-06 – 2020-10-07 (×2): 5 mg/h via INTRAVENOUS
  Administered 2020-10-07: 10 mg/h via INTRAVENOUS
  Administered 2020-10-08: 5 mg/h via INTRAVENOUS
  Filled 2020-10-06 (×3): qty 125

## 2020-10-06 MED ORDER — DILTIAZEM HCL 25 MG/5ML IV SOLN
15.0000 mg | Freq: Once | INTRAVENOUS | Status: AC
  Administered 2020-10-06: 15 mg via INTRAVENOUS
  Filled 2020-10-06: qty 5

## 2020-10-06 MED ORDER — DILTIAZEM LOAD VIA INFUSION
10.0000 mg | Freq: Once | INTRAVENOUS | Status: AC
  Administered 2020-10-06: 10 mg via INTRAVENOUS
  Filled 2020-10-06: qty 10

## 2020-10-06 MED ORDER — CEFAZOLIN SODIUM-DEXTROSE 2-4 GM/100ML-% IV SOLN
2.0000 g | Freq: Once | INTRAVENOUS | Status: AC
  Administered 2020-10-07: 2 g via INTRAVENOUS
  Filled 2020-10-06: qty 100

## 2020-10-06 NOTE — ED Notes (Signed)
Per patient, she is afib at baseline and is here to have the "cellulitis" on her left lower leg checked and an [place] of her leg. Patient states no heart complaints at this time.

## 2020-10-06 NOTE — ED Triage Notes (Addendum)
Patient reports sent by PCP for tachycardia and irregular heart rate. States hx of a-fib and heart failure.Lorraine Harris with cardiology. Reports wound to left lower leg after fall this week. States wound is draining.

## 2020-10-06 NOTE — ED Notes (Signed)
Patient transported to CT 

## 2020-10-06 NOTE — ED Provider Notes (Signed)
[facility] DEPT Provider Note   CSN: [id number] Arrival date & time: 10/06/20  2000     History Chief Complaint  Patient presents with  . Leg Problem    Lorraine Harris is a 70 y.o. female.  The history is provided by the patient and medical records.   Lorraine Harris is a 70 y.o. female who presents to the Emergency Department complaining of leg swelling. She has a history of atrial fibrillation and CHF. She states that she went to urgent care today for a wound check after scratching her left leg two weeks ago. She states that the wound has been draining for the last week. She denies any fevers, chest pain, shortness of breath. When she went to urgent care she was found to be in a fib with RVR was referred to the emergency department for further evaluation. She states that she is completely asymptomatic from the atrial fibrillation. She does have dyspnea on exertion, but this is at her baseline. She does take Eliquis.     Past Medical History:  Diagnosis Date  . A-fib ([place])   . A-fib ([place])   . Asthma   . CHF (congestive heart failure) ([place])   . Chronic combined systolic and diastolic heart failure ([place]) 12/17/2018  . Hyperlipidemia   . Hypertension     Patient Active Problem List   Diagnosis Date Noted  . Contusion of left knee 10/19/2019  . Chronic combined systolic and diastolic heart failure ([place]) 12/17/2018  . Mixed hyperlipidemia 12/17/2018  . Dilated cardiomyopathy ([place]) 09/20/2014  . LBBB (left bundle branch block) 09/20/2014  . AORTIC VALVE DISORDERS 03/21/2008  . OTITIS EXTERNA, ACUTE, LEFT 03/14/2008  . COLONIC POLYPS, BENIGN 07/09/2007  . Morbid obesity ([place]) 07/09/2007  . DEPRESSION 07/09/2007  . MIGRAINE HEADACHE 07/09/2007  . Essential hypertension 07/09/2007  . ESOPHAGEAL STRICTURE 05/11/2001    Past Surgical History:  Procedure Laterality Date  . LEFT HEART CATHETERIZATION WITH CORONARY ANGIOGRAM N/A 09/20/2014    Procedure: LEFT HEART CATHETERIZATION WITH CORONARY ANGIOGRAM;  Surgeon: Laverda Page, MD;  Location: [facility] CATH LAB;  Service: Cardiovascular;  Laterality: N/A;     OB History   No obstetric history on file.     Family History  Problem Relation Age of Onset  . Heart disease Mother   . Kidney disease Father   . Heart attack Father   . Sleep apnea Father   . Heart disease Sister     Social History   Tobacco Use  . Smoking status: Former Smoker    Packs/day: 1.00    Years: 30.00    Pack years: 30.00    Quit date: 2012    Years since quitting: 10.1  . Smokeless tobacco: Never Used  Vaping Use  . Vaping Use: Never used  Substance Use Topics  . Alcohol use: No  . Drug use: No    Home Medications Prior to Admission medications   Medication Sig Start Date End Date Taking? Authorizing Provider  albuterol (PROVENTIL HFA;VENTOLIN HFA) 108 (90 BASE) MCG/ACT inhaler Inhale 1 puff into the lungs every 6 (six) hours as needed for wheezing or shortness of breath.   Yes [provider]  ALPRAZolam Duanne Moron) 0.5 MG tablet Take 0.5 mg by mouth 3 (three) times daily as needed for anxiety.  06/17/13  Yes [provider]  atorvastatin (LIPITOR) 10 MG tablet TAKE 1 TABLET BY MOUTH EVERY DAY 08/17/20  Yes Adrian Prows, MD  buPROPion (WELLBUTRIN XL)  150 MG 24 hr tablet Take 150 mg by mouth daily. 07/31/20  Yes [provider]  furosemide (LASIX) 40 MG tablet TAKE 1 TABLET BY MOUTH EVERY DAY 04/03/20  Yes Adrian Prows, MD  metoprolol succinate (TOPROL-XL) 50 MG 24 hr tablet TAKE 1 TABLET BY MOUTH 2 TIMES A DAY. TAKE WITH OR IMMEDIATELY FOLLOWING A MEAL. 09/04/20  Yes Adrian Prows, MD  spironolactone (ALDACTONE) 25 MG tablet TAKE 1 TABLET BY MOUTH EVERY DAY IN THE MORNING 04/07/20  Yes Adrian Prows, MD  venlafaxine (EFFEXOR) 75 MG tablet Take 75 mg by mouth daily. 01/14/19  Yes [provider]  XARELTO 15 MG TABS tablet TAKE 1 TABLET (15 MG TOTAL) BY MOUTH DAILY WITH  SUPPER. 05/30/20  Yes Adrian Prows, MD  diphenhydrAMINE (BENADRYL) 25 MG tablet Take 25 mg by mouth every 6 (six) hours as needed for allergies.    [provider]    Allergies    Morphine and related, Valium [diazepam], and Latex  Review of Systems   Review of Systems  All other systems reviewed and are negative.   Physical Exam Updated Vital Signs BP (!) 131/99   Pulse (!) 110   Temp 98.1 F (36.7 C) (Oral)   Resp (!) 22   Ht 5\' 4"  (1.626 m)   Wt (!) 149.7 kg   SpO2 97%   BMI 56.64 kg/m   Physical Exam Vitals and nursing note reviewed.  Constitutional:      Appearance: She is well-developed and well-nourished.  HENT:     Head: Normocephalic and atraumatic.  Cardiovascular:     Rate and Rhythm: Tachycardia present. Rhythm irregular.     Heart sounds: No murmur heard.   Pulmonary:     Effort: Pulmonary effort is normal. No respiratory distress.     Breath sounds: Normal breath sounds.  Abdominal:     Palpations: Abdomen is soft.     Tenderness: There is no abdominal tenderness. There is no guarding or rebound.  Musculoskeletal:        General: No tenderness or edema.     Comments: 2+ DP pulses bilaterally. There is trace pitting edema to bilateral lower extremities. The left shin has two wounds with a small amount of serious drainage. There are chronic venous stasis changes to bilateral lower extremities, no overt erythema.  Skin:    General: Skin is warm and dry.  Neurological:     Mental Status: She is alert and oriented to person, place, and time.  Psychiatric:        Mood and Affect: Mood and affect normal.        Behavior: Behavior normal.     ED Results / Procedures / Treatments   Labs (all labs ordered are listed, but only abnormal results are displayed) Labs Reviewed  COMPREHENSIVE METABOLIC PANEL - Abnormal; Notable for the following components:      Result Value   Glucose, Bld 115 (*)    BUN 29 (*)    Creatinine, Ser 1.39 (*)    GFR,  Estimated 41 (*)    All other components within normal limits  CBC WITH DIFFERENTIAL/PLATELET - Abnormal; Notable for the following components:   RBC 5.20 (*)    MCH 25.6 (*)    RDW 19.8 (*)    Platelets 445 (*)    nRBC 0.6 (*)    All other components within normal limits  D-DIMER, QUANTITATIVE (NOT AT [facility]) - Abnormal; Notable for the following components:   D-Dimer, Quant  0.88 (*)    All other components within normal limits  SARS CORONAVIRUS 2 (TAT 6-24 HRS)  MAGNESIUM  BRAIN NATRIURETIC PEPTIDE  TROPONIN I (HIGH SENSITIVITY)    EKG None  Radiology CT Angio Chest PE W/Cm &/Or Wo Cm  Result Date: 10/06/2020 CLINICAL DATA:  70 year old female with concern for pulmonary embolism. EXAM: CT ANGIOGRAPHY CHEST WITH CONTRAST TECHNIQUE: Multidetector CT imaging of the chest was performed using the standard protocol during bolus administration of intravenous contrast. Multiplanar CT image reconstructions and MIPs were obtained to evaluate the vascular anatomy. CONTRAST:  124mL OMNIPAQUE IOHEXOL 350 MG/ML SOLN COMPARISON:  Chest radiograph dated 03/05/2019. FINDINGS: Evaluation is limited due to streak artifact caused by patient's arms. Cardiovascular: There is mild cardiomegaly. No pericardial effusion. Coronary vascular calcification and calcification of the mitral annulus. There is retrograde flow of contrast from the right atrium into the IVC suggestive of a degree of right heart dysfunction. There is mild atherosclerotic calcification of the thoracic aorta. The aorta is tortuous. Mild dilatation of the main pulmonary trunk suggestive of pulmonary hypertension. Clinical correlation is recommended. Evaluation of the pulmonary arteries is limited due to respiratory motion artifact. No large or central pulmonary artery embolus identified. Mediastinum/Nodes: No hilar or mediastinal adenopathy. The esophagus is grossly unremarkable. No mediastinal fluid collection. Lungs/Pleura: Diffuse bilateral  streaky and interstitial densities may represent atelectasis or atypical infection. Clinical correlation is recommended. No pleural effusion or pneumothorax. The central airways are patent. Upper Abdomen: Indeterminate 2.5 cm right renal upper pole hypodense lesion corresponding to the cyst seen on the prior CT. Musculoskeletal: Degenerative changes of the spine. No acute osseous pathology. Review of the MIP images confirms the above findings. IMPRESSION: 1. No CT evidence of central pulmonary artery embolus. 2. Mild cardiomegaly with findings of right heart dysfunction. Clinical correlation is recommended. 3. Diffuse bilateral streaky and interstitial densities may represent atelectasis or atypical infection. Clinical correlation is recommended. 4. Aortic Atherosclerosis (ICD10-I70.0). Electronically Signed   By: Anner Crete M.D.   On: 10/06/2020 22:55    Procedures Procedures CRITICAL CARE Performed by: Quintella Reichert   Total critical care time: 35 minutes  Critical care time was exclusive of separately billable procedures and treating other patients.  Critical care was necessary to treat or prevent imminent or life-threatening deterioration.  Critical care was time spent personally by me on the following activities: development of treatment plan with patient and/or surrogate as well as nursing, discussions with consultants, evaluation of patient's response to treatment, examination of patient, obtaining history from patient or surrogate, ordering and performing treatments and interventions, ordering and review of laboratory studies, ordering and review of radiographic studies, pulse oximetry and re-evaluation of patient's condition.   Medications Ordered in ED Medications  diltiazem (CARDIZEM) 125 mg in dextrose 5% 125 mL (1 mg/mL) infusion (10 mg/hr Intravenous Rate/Dose Change 10/06/20 2334)  ceFAZolin (ANCEF) IVPB 2g/100 mL premix (2 g Intravenous New Bag/Given 10/07/20 0008)  diltiazem  (CARDIZEM) injection 15 mg (15 mg Intravenous Given 10/06/20 2101)  diltiazem (CARDIZEM) 1 mg/mL load via infusion 10 mg (10 mg Intravenous Bolus from Bag 10/06/20 2300)  iohexol (OMNIPAQUE) 350 MG/ML injection 75 mL (100 mLs Intravenous Contrast Given 10/06/20 2224)    ED Course  I have reviewed the triage vital signs and the nursing notes.  Pertinent labs & imaging results that were available during my care of the patient were reviewed by me and considered in my medical decision making (see chart for details).    MDM  Rules/Calculators/A&P                          Pt with hx/o paroxysmal afib, CHF here for evaluation of afib with RVR, noted when she went to urgent care for evaluation of left leg wound.  On ED presentation pt in rapid atrial fibrillation, HR 140s-150s but states she is asymptomatic.  Given recent leg wound a ddimer was obtained, which was mildly elevated at .88. CTA was obtained, which is negative for PE.  Pt was treated with diltiazem for heart rate control with persistent tachycardia, 110-130.  D/w Dr. Terri Skains with [facility], given her current EF and creatinine she is not a direct cardioversion candidate.  Will admit for afib with RVR and start abx for possible early cellulitis to the LLE driving her sxs.  Hospitalist consulted for admission.     Final Clinical Impression(s) / ED Diagnoses Final diagnoses:  Rapid atrial fibrillation [facility])    Rx / DC Orders ED Discharge Orders    None       Quintella Reichert, MD 10/07/20 [phone]

## 2020-10-07 ENCOUNTER — Encounter (HOSPITAL_COMMUNITY): Payer: Self-pay | Admitting: Internal Medicine

## 2020-10-07 ENCOUNTER — Encounter (HOSPITAL_COMMUNITY): Payer: Medicare HMO

## 2020-10-07 DIAGNOSIS — E875 Hyperkalemia: Secondary | ICD-10-CM | POA: Diagnosis not present

## 2020-10-07 DIAGNOSIS — Z79899 Other long term (current) drug therapy: Secondary | ICD-10-CM | POA: Diagnosis not present

## 2020-10-07 DIAGNOSIS — L03116 Cellulitis of left lower limb: Secondary | ICD-10-CM

## 2020-10-07 DIAGNOSIS — N1832 Chronic kidney disease, stage 3b: Secondary | ICD-10-CM | POA: Diagnosis present

## 2020-10-07 DIAGNOSIS — F419 Anxiety disorder, unspecified: Secondary | ICD-10-CM | POA: Diagnosis present

## 2020-10-07 DIAGNOSIS — J45909 Unspecified asthma, uncomplicated: Secondary | ICD-10-CM | POA: Diagnosis present

## 2020-10-07 DIAGNOSIS — I420 Dilated cardiomyopathy: Secondary | ICD-10-CM | POA: Diagnosis present

## 2020-10-07 DIAGNOSIS — I4892 Unspecified atrial flutter: Secondary | ICD-10-CM | POA: Diagnosis present

## 2020-10-07 DIAGNOSIS — E782 Mixed hyperlipidemia: Secondary | ICD-10-CM | POA: Diagnosis present

## 2020-10-07 DIAGNOSIS — L03115 Cellulitis of right lower limb: Secondary | ICD-10-CM | POA: Diagnosis present

## 2020-10-07 DIAGNOSIS — J9601 Acute respiratory failure with hypoxia: Secondary | ICD-10-CM | POA: Diagnosis present

## 2020-10-07 DIAGNOSIS — F32A Depression, unspecified: Secondary | ICD-10-CM | POA: Diagnosis present

## 2020-10-07 DIAGNOSIS — I4891 Unspecified atrial fibrillation: Secondary | ICD-10-CM | POA: Diagnosis present

## 2020-10-07 DIAGNOSIS — I5023 Acute on chronic systolic (congestive) heart failure: Secondary | ICD-10-CM | POA: Diagnosis present

## 2020-10-07 DIAGNOSIS — T380X5A Adverse effect of glucocorticoids and synthetic analogues, initial encounter: Secondary | ICD-10-CM | POA: Diagnosis not present

## 2020-10-07 DIAGNOSIS — I130 Hypertensive heart and chronic kidney disease with heart failure and stage 1 through stage 4 chronic kidney disease, or unspecified chronic kidney disease: Secondary | ICD-10-CM | POA: Diagnosis present

## 2020-10-07 DIAGNOSIS — J1282 Pneumonia due to coronavirus disease 2019: Secondary | ICD-10-CM | POA: Diagnosis present

## 2020-10-07 DIAGNOSIS — I428 Other cardiomyopathies: Secondary | ICD-10-CM | POA: Diagnosis present

## 2020-10-07 DIAGNOSIS — G4700 Insomnia, unspecified: Secondary | ICD-10-CM | POA: Diagnosis present

## 2020-10-07 DIAGNOSIS — Z6841 Body Mass Index (BMI) 40.0 and over, adult: Secondary | ICD-10-CM | POA: Diagnosis not present

## 2020-10-07 DIAGNOSIS — Z87891 Personal history of nicotine dependence: Secondary | ICD-10-CM | POA: Diagnosis not present

## 2020-10-07 DIAGNOSIS — U071 COVID-19: Secondary | ICD-10-CM | POA: Diagnosis present

## 2020-10-07 DIAGNOSIS — I4819 Other persistent atrial fibrillation: Secondary | ICD-10-CM | POA: Diagnosis present

## 2020-10-07 DIAGNOSIS — Z7901 Long term (current) use of anticoagulants: Secondary | ICD-10-CM | POA: Diagnosis not present

## 2020-10-07 LAB — BASIC METABOLIC PANEL
Anion gap: 8 (ref 5–15)
BUN: 25 mg/dL — ABNORMAL HIGH (ref 8–23)
CO2: 26 mmol/L (ref 22–32)
Calcium: 8.1 mg/dL — ABNORMAL LOW (ref 8.9–10.3)
Chloride: 100 mmol/L (ref 98–111)
Creatinine, Ser: 1.41 mg/dL — ABNORMAL HIGH (ref 0.44–1.00)
GFR, Estimated: 40 mL/min — ABNORMAL LOW (ref >60)
Glucose, Bld: 116 mg/dL — ABNORMAL HIGH (ref 70–99)
Potassium: 3.7 mmol/L (ref 3.5–5.1)
Sodium: 134 mmol/L — ABNORMAL LOW (ref 135–145)

## 2020-10-07 LAB — CBC
HCT: 40.1 % (ref 36.0–46.0)
Hemoglobin: 12.3 g/dL (ref 12.0–15.0)
MCH: 25.9 pg — ABNORMAL LOW (ref 26.0–34.0)
MCHC: 30.7 g/dL (ref 30.0–36.0)
MCV: 84.6 fL (ref 80.0–100.0)
Platelets: 389 10*3/uL (ref 150–400)
RBC: 4.74 MIL/uL (ref 3.87–5.11)
RDW: 19.6 % — ABNORMAL HIGH (ref 11.5–15.5)
WBC: 7.9 10*3/uL (ref 4.0–10.5)
nRBC: 0.8 % — ABNORMAL HIGH (ref 0.0–0.2)

## 2020-10-07 LAB — BRAIN NATRIURETIC PEPTIDE: B Natriuretic Peptide: 611 pg/mL — ABNORMAL HIGH (ref 0.0–100.0)

## 2020-10-07 LAB — MAGNESIUM: Magnesium: 1.4 mg/dL — ABNORMAL LOW (ref 1.7–2.4)

## 2020-10-07 LAB — CBG MONITORING, ED
Glucose-Capillary: 124 mg/dL — ABNORMAL HIGH (ref 70–99)
Glucose-Capillary: 139 mg/dL — ABNORMAL HIGH (ref 70–99)
Glucose-Capillary: 153 mg/dL — ABNORMAL HIGH (ref 70–99)

## 2020-10-07 LAB — TROPONIN I (HIGH SENSITIVITY)
Troponin I (High Sensitivity): 14 ng/L (ref <18)
Troponin I (High Sensitivity): 15 ng/L (ref <18)

## 2020-10-07 LAB — TSH: TSH: 1.635 u[IU]/mL (ref 0.350–4.500)

## 2020-10-07 LAB — HIV ANTIBODY (ROUTINE TESTING W REFLEX): HIV Screen 4th Generation wRfx: NONREACTIVE

## 2020-10-07 LAB — SARS CORONAVIRUS 2 (TAT 6-24 HRS): SARS Coronavirus 2: POSITIVE — AB

## 2020-10-07 MED ORDER — ACETAMINOPHEN 650 MG RE SUPP: 650.0000 mg | Freq: Four times a day (QID) | RECTAL | Status: DC | PRN

## 2020-10-07 MED ORDER — METOPROLOL SUCCINATE ER 50 MG PO TB24
50.0000 mg | ORAL_TABLET | Freq: Three times a day (TID) | ORAL | Status: DC
  Administered 2020-10-07 – 2020-10-11 (×10): 50 mg via ORAL
  Filled 2020-10-07 (×11): qty 1

## 2020-10-07 MED ORDER — METOPROLOL SUCCINATE ER 50 MG PO TB24
50.0000 mg | ORAL_TABLET | Freq: Two times a day (BID) | ORAL | Status: DC
  Administered 2020-10-07 (×2): 50 mg via ORAL
  Filled 2020-10-07 (×2): qty 1

## 2020-10-07 MED ORDER — SODIUM CHLORIDE 0.9 % IV SOLN
100.0000 mg | Freq: Every day | INTRAVENOUS | Status: AC
  Administered 2020-10-08 – 2020-10-11 (×4): 100 mg via INTRAVENOUS
  Filled 2020-10-07 (×4): qty 20

## 2020-10-07 MED ORDER — ATORVASTATIN CALCIUM 10 MG PO TABS
10.0000 mg | ORAL_TABLET | Freq: Every day | ORAL | Status: DC
  Administered 2020-10-07 – 2020-10-13 (×7): 10 mg via ORAL
  Filled 2020-10-07 (×7): qty 1

## 2020-10-07 MED ORDER — VITAMIN D 25 MCG (1000 UNIT) PO TABS
1000.0000 [IU] | ORAL_TABLET | Freq: Every day | ORAL | Status: DC
  Administered 2020-10-07 – 2020-10-13 (×7): 1000 [IU] via ORAL
  Filled 2020-10-07 (×7): qty 1

## 2020-10-07 MED ORDER — FUROSEMIDE 10 MG/ML IJ SOLN
40.0000 mg | Freq: Every day | INTRAMUSCULAR | Status: DC
  Administered 2020-10-07 – 2020-10-11 (×5): 40 mg via INTRAVENOUS
  Filled 2020-10-07 (×6): qty 4

## 2020-10-07 MED ORDER — FENTANYL CITRATE (PF) 100 MCG/2ML IJ SOLN
12.5000 ug | Freq: Once | INTRAMUSCULAR | Status: AC
  Administered 2020-10-07: 12.5 ug via INTRAVENOUS
  Filled 2020-10-07: qty 2

## 2020-10-07 MED ORDER — ALPRAZOLAM 0.5 MG PO TABS
0.5000 mg | ORAL_TABLET | Freq: Three times a day (TID) | ORAL | Status: DC | PRN
  Administered 2020-10-07 – 2020-10-12 (×12): 0.5 mg via ORAL
  Filled 2020-10-07 (×12): qty 1

## 2020-10-07 MED ORDER — IPRATROPIUM-ALBUTEROL 20-100 MCG/ACT IN AERS
1.0000 | INHALATION_SPRAY | Freq: Four times a day (QID) | RESPIRATORY_TRACT | Status: DC
  Administered 2020-10-07 (×3): 1 via RESPIRATORY_TRACT
  Filled 2020-10-07: qty 4

## 2020-10-07 MED ORDER — RIVAROXABAN 20 MG PO TABS
20.0000 mg | ORAL_TABLET | Freq: Every day | ORAL | Status: DC
  Administered 2020-10-07 – 2020-10-12 (×7): 20 mg via ORAL
  Filled 2020-10-07 (×8): qty 1

## 2020-10-07 MED ORDER — PREDNISONE 50 MG PO TABS
50.0000 mg | ORAL_TABLET | Freq: Every day | ORAL | Status: DC
  Administered 2020-10-10 – 2020-10-13 (×4): 50 mg via ORAL
  Filled 2020-10-07 (×4): qty 1

## 2020-10-07 MED ORDER — SODIUM CHLORIDE 0.9 % IV SOLN
200.0000 mg | Freq: Once | INTRAVENOUS | Status: AC
  Administered 2020-10-07: 200 mg via INTRAVENOUS
  Filled 2020-10-07: qty 200

## 2020-10-07 MED ORDER — CEFAZOLIN SODIUM-DEXTROSE 2-4 GM/100ML-% IV SOLN
2.0000 g | Freq: Three times a day (TID) | INTRAVENOUS | Status: DC
  Administered 2020-10-07 – 2020-10-11 (×13): 2 g via INTRAVENOUS
  Filled 2020-10-07 (×15): qty 100

## 2020-10-07 MED ORDER — MAGNESIUM SULFATE 2 GM/50ML IV SOLN
2.0000 g | Freq: Once | INTRAVENOUS | Status: AC
  Administered 2020-10-07: 2 g via INTRAVENOUS
  Filled 2020-10-07: qty 50

## 2020-10-07 MED ORDER — GUAIFENESIN-DM 100-10 MG/5ML PO SYRP: 10.0000 mL | ORAL_SOLUTION | ORAL | Status: DC | PRN

## 2020-10-07 MED ORDER — ASCORBIC ACID 500 MG PO TABS
500.0000 mg | ORAL_TABLET | Freq: Every day | ORAL | Status: DC
  Administered 2020-10-07 – 2020-10-13 (×7): 500 mg via ORAL
  Filled 2020-10-07 (×7): qty 1

## 2020-10-07 MED ORDER — BUPROPION HCL ER (XL) 150 MG PO TB24
150.0000 mg | ORAL_TABLET | Freq: Every day | ORAL | Status: DC
  Administered 2020-10-07 – 2020-10-13 (×7): 150 mg via ORAL
  Filled 2020-10-07 (×7): qty 1

## 2020-10-07 MED ORDER — ACETAMINOPHEN 325 MG PO TABS
650.0000 mg | ORAL_TABLET | Freq: Four times a day (QID) | ORAL | Status: DC | PRN
  Administered 2020-10-07 – 2020-10-12 (×5): 650 mg via ORAL
  Filled 2020-10-07 (×5): qty 2

## 2020-10-07 MED ORDER — SODIUM CHLORIDE 0.9 % IV SOLN
1.0000 mg/kg | Freq: Two times a day (BID) | INTRAVENOUS | Status: AC
  Administered 2020-10-07 – 2020-10-09 (×6): 150 mg via INTRAVENOUS
  Filled 2020-10-07 (×6): qty 1.2

## 2020-10-07 MED ORDER — BARICITINIB 2 MG PO TABS
2.0000 mg | ORAL_TABLET | Freq: Every day | ORAL | Status: DC
  Administered 2020-10-07 – 2020-10-11 (×5): 2 mg via ORAL
  Filled 2020-10-07 (×5): qty 1

## 2020-10-07 MED ORDER — ZINC SULFATE 220 (50 ZN) MG PO CAPS
220.0000 mg | ORAL_CAPSULE | Freq: Every day | ORAL | Status: DC
  Administered 2020-10-07 – 2020-10-13 (×7): 220 mg via ORAL
  Filled 2020-10-07 (×7): qty 1

## 2020-10-07 MED ORDER — VENLAFAXINE HCL 75 MG PO TABS
75.0000 mg | ORAL_TABLET | Freq: Every day | ORAL | Status: DC
  Administered 2020-10-07 – 2020-10-13 (×7): 75 mg via ORAL
  Filled 2020-10-07 (×7): qty 1

## 2020-10-07 MED ORDER — SPIRONOLACTONE 25 MG PO TABS
25.0000 mg | ORAL_TABLET | Freq: Every day | ORAL | Status: DC
  Administered 2020-10-07 – 2020-10-11 (×5): 25 mg via ORAL
  Filled 2020-10-07 (×5): qty 1

## 2020-10-07 MED ORDER — INSULIN ASPART 100 UNIT/ML ~~LOC~~ SOLN
0.0000 [IU] | Freq: Three times a day (TID) | SUBCUTANEOUS | Status: DC
  Administered 2020-10-07 – 2020-10-08 (×3): 2 [IU] via SUBCUTANEOUS
  Administered 2020-10-08 – 2020-10-09 (×2): 3 [IU] via SUBCUTANEOUS
  Administered 2020-10-09: 8 [IU] via SUBCUTANEOUS
  Administered 2020-10-10 (×2): 5 [IU] via SUBCUTANEOUS
  Administered 2020-10-10 – 2020-10-11 (×3): 3 [IU] via SUBCUTANEOUS
  Administered 2020-10-12: 2 [IU] via SUBCUTANEOUS
  Administered 2020-10-12: 3 [IU] via SUBCUTANEOUS
  Administered 2020-10-13: 2 [IU] via SUBCUTANEOUS
  Administered 2020-10-13: 0 [IU] via SUBCUTANEOUS
  Filled 2020-10-07: qty 0.15

## 2020-10-07 MED ORDER — ADULT MULTIVITAMIN W/MINERALS CH
1.0000 | ORAL_TABLET | Freq: Every day | ORAL | Status: DC
  Administered 2020-10-07 – 2020-10-13 (×7): 1 via ORAL
  Filled 2020-10-07 (×7): qty 1

## 2020-10-07 NOTE — H&P (Signed)
History and Physical    Lorraine Harris WPY:[id number] DOB: [birth year] DOA: 10/06/2020  PCP: Everardo Beals, NP  Patient coming from: Home.  Chief Complaint: Lower extremity edema.  HPI: Lorraine Harris is a 70 y.o. female with history of chronic systolic heart failure last EF measured in August 2020 was 35 to 40% with history of paroxysmal atrial fibrillation hypertension chronic kidney disease stage II hyperlipidemia depression morbid obesity has been experiencing increasing bilateral lower extremity edema over the last 1 week and particularly on the left lower extremity patient also noticed some discharge and erythema.  Had gone to urgent care and over there patient was found to be tachycardic was referred to the ER.  Patient has chronic shortness of breath and denies any chest pain.  Denies any productive cough fever or chills.  Patient states she has been compliant with her medications.  ED Course: In the ER patient was found to be in A. fib with RVR and was started on Cardizem drip.  On exam patient's lower extremities are edematous with left lower extremity mildly erythematous and warm to touch concerning for cellulitis and was started on Ancef.  CT angiogram of the chest was negative for pulmonary embolism but does show features concerning for right-sided heart failure.  Patient admitted for A. fib with RVR with CHF and cellulitis of the lower extremities.  BNP is 611 high sensitive point was 15 creatinine 1.3 Covid test is pending.  Magnesium is low which we will replace.  Review of Systems: As per HPI, rest all negative.   Past Medical History:  Diagnosis Date  . A-fib ([place])   . A-fib ([place])   . Asthma   . CHF (congestive heart failure) ([place])   . Chronic combined systolic and diastolic heart failure ([place]) 12/17/2018  . Hyperlipidemia   . Hypertension     Past Surgical History:  Procedure Laterality Date  . LEFT HEART CATHETERIZATION WITH CORONARY ANGIOGRAM N/A  09/20/2014   Procedure: LEFT HEART CATHETERIZATION WITH CORONARY ANGIOGRAM;  Surgeon: Laverda Page, MD;  Location: [facility] CATH LAB;  Service: Cardiovascular;  Laterality: N/A;     reports that she quit smoking about 10 years ago. She has a 30.00 pack-year smoking history. She has never used smokeless tobacco. She reports that she does not drink alcohol and does not use drugs.  Allergies  Allergen Reactions  . Morphine And Related Other (See Comments)    Seizure  . Valium [Diazepam] Other (See Comments)    "I can't walk"  . Latex Rash    Family History  Problem Relation Age of Onset  . Heart disease Mother   . Kidney disease Father   . Heart attack Father   . Sleep apnea Father   . Heart disease Sister     Prior to Admission medications   Medication Sig Start Date End Date Taking? Authorizing Provider  albuterol (PROVENTIL HFA;VENTOLIN HFA) 108 (90 BASE) MCG/ACT inhaler Inhale 1 puff into the lungs every 6 (six) hours as needed for wheezing or shortness of breath.   Yes [provider]  ALPRAZolam Duanne Moron) 0.5 MG tablet Take 0.5 mg by mouth 3 (three) times daily as needed for anxiety.  06/17/13  Yes [provider]  atorvastatin (LIPITOR) 10 MG tablet TAKE 1 TABLET BY MOUTH EVERY DAY 08/17/20  Yes Adrian Prows, MD  buPROPion (WELLBUTRIN XL) 150 MG 24 hr tablet Take 150 mg by mouth daily. 07/31/20  Yes [provider]  furosemide (LASIX) 40  MG tablet TAKE 1 TABLET BY MOUTH EVERY DAY 04/03/20  Yes Yates Decamp, MD  metoprolol succinate (TOPROL-XL) 50 MG 24 hr tablet TAKE 1 TABLET BY MOUTH 2 TIMES A DAY. TAKE WITH OR IMMEDIATELY FOLLOWING A MEAL. 09/04/20  Yes Yates Decamp, MD  spironolactone (ALDACTONE) 25 MG tablet TAKE 1 TABLET BY MOUTH EVERY DAY IN THE MORNING 04/07/20  Yes Yates Decamp, MD  venlafaxine (EFFEXOR) 75 MG tablet Take 75 mg by mouth daily. 01/14/19  Yes [provider]  XARELTO 15 MG TABS tablet TAKE 1 TABLET (15 MG TOTAL) BY MOUTH DAILY WITH SUPPER.  05/30/20  Yes Yates Decamp, MD  diphenhydrAMINE (BENADRYL) 25 MG tablet Take 25 mg by mouth every 6 (six) hours as needed for allergies.    [provider]    Physical Exam: Constitutional: Moderately built and nourished. Vitals:   10/06/20 2158 10/06/20 2159 10/06/20 2245 10/06/20 2330  BP:   (!) 111/99 (!) 131/99  Pulse: 92 (!) 47 99 (!) 110  Resp: 16 14 18  (!) 22  Temp:      TempSrc:      SpO2: 94% 97% 99% 97%  Weight:      Height:       Eyes: Anicteric no pallor. ENMT: No discharge from the ears eyes nose or mouth. Neck: No mass felt.  No JVD appreciated. Respiratory: No rhonchi or crepitations. Cardiovascular: S1-S2 heard. Abdomen: Soft nontender bowel sounds present. Musculoskeletal: Bilateral lower extremity edema more on the left side. Skin: Erythema of the left lower extremity warm to touch. Neurologic: Alert awake oriented to time place and person.  Moves all extremities. Psychiatric: Appears normal.  Normal affect.   Labs on Admission: I have personally reviewed following labs and imaging studies  CBC: Recent Labs  Lab 10/06/20 2054  WBC 8.4  NEUTROABS 5.6  HGB 13.3  HCT 43.3  MCV 83.3  PLT 445*   Basic Metabolic Panel: Recent Labs  Lab 10/06/20 2054  NA 139  K 4.2  CL 99  CO2 26  GLUCOSE 115*  BUN 29*  CREATININE 1.39*  CALCIUM 9.4  MG 1.8   GFR: Estimated Creatinine Clearance: 55.1 mL/min (A) (by C-G formula based on SCr of 1.39 mg/dL (H)). Liver Function Tests: Recent Labs  Lab 10/06/20 2054  AST 15  ALT 15  ALKPHOS 58  BILITOT 0.3  PROT 7.2  ALBUMIN 3.5   No results for input(s): LIPASE, AMYLASE in the last 168 hours. No results for input(s): AMMONIA in the last 168 hours. Coagulation Profile: No results for input(s): INR, PROTIME in the last 168 hours. Cardiac Enzymes: No results for input(s): CKTOTAL, CKMB, CKMBINDEX, TROPONINI in the last 168 hours. BNP (last 3 results) No results for input(s): PROBNP in the last 8760  hours. HbA1C: No results for input(s): HGBA1C in the last 72 hours. CBG: No results for input(s): GLUCAP in the last 168 hours. Lipid Profile: No results for input(s): CHOL, HDL, LDLCALC, TRIG, CHOLHDL, LDLDIRECT in the last 72 hours. Thyroid Function Tests: No results for input(s): TSH, T4TOTAL, FREET4, T3FREE, THYROIDAB in the last 72 hours. Anemia Panel: No results for input(s): VITAMINB12, FOLATE, FERRITIN, TIBC, IRON, RETICCTPCT in the last 72 hours. Urine analysis:    Component Value Date/Time   COLORURINE YELLOW 06/19/2013 0304   APPEARANCEUR CLEAR 06/19/2013 0304   LABSPEC 1.032 (H) 06/19/2013 0304   PHURINE 5.0 06/19/2013 0304   GLUCOSEU NEGATIVE 06/19/2013 0304   HGBUR NEGATIVE 06/19/2013 0304   BILIRUBINUR NEGATIVE 06/19/2013 0304  KETONESUR NEGATIVE 06/19/2013 0304   PROTEINUR NEGATIVE 06/19/2013 0304   UROBILINOGEN 0.2 06/19/2013 0304   NITRITE NEGATIVE 06/19/2013 0304   LEUKOCYTESUR NEGATIVE 06/19/2013 0304   Sepsis Labs: @LABRCNTIP (procalcitonin:4,lacticidven:4) )No results found for this or any previous visit (from the past 240 hour(s)).   Radiological Exams on Admission: CT Angio Chest PE W/Cm &/Or Wo Cm  Result Date: 10/06/2020 CLINICAL DATA:  70 year old female with concern for pulmonary embolism. EXAM: CT ANGIOGRAPHY CHEST WITH CONTRAST TECHNIQUE: Multidetector CT imaging of the chest was performed using the standard protocol during bolus administration of intravenous contrast. Multiplanar CT image reconstructions and MIPs were obtained to evaluate the vascular anatomy. CONTRAST:  171mL OMNIPAQUE IOHEXOL 350 MG/ML SOLN COMPARISON:  Chest radiograph dated 03/05/2019. FINDINGS: Evaluation is limited due to streak artifact caused by patient's arms. Cardiovascular: There is mild cardiomegaly. No pericardial effusion. Coronary vascular calcification and calcification of the mitral annulus. There is retrograde flow of contrast from the right atrium into the IVC  suggestive of a degree of right heart dysfunction. There is mild atherosclerotic calcification of the thoracic aorta. The aorta is tortuous. Mild dilatation of the main pulmonary trunk suggestive of pulmonary hypertension. Clinical correlation is recommended. Evaluation of the pulmonary arteries is limited due to respiratory motion artifact. No large or central pulmonary artery embolus identified. Mediastinum/Nodes: No hilar or mediastinal adenopathy. The esophagus is grossly unremarkable. No mediastinal fluid collection. Lungs/Pleura: Diffuse bilateral streaky and interstitial densities may represent atelectasis or atypical infection. Clinical correlation is recommended. No pleural effusion or pneumothorax. The central airways are patent. Upper Abdomen: Indeterminate 2.5 cm right renal upper pole hypodense lesion corresponding to the cyst seen on the prior CT. Musculoskeletal: Degenerative changes of the spine. No acute osseous pathology. Review of the MIP images confirms the above findings. IMPRESSION: 1. No CT evidence of central pulmonary artery embolus. 2. Mild cardiomegaly with findings of right heart dysfunction. Clinical correlation is recommended. 3. Diffuse bilateral streaky and interstitial densities may represent atelectasis or atypical infection. Clinical correlation is recommended. 4. Aortic Atherosclerosis (ICD10-I70.0). Electronically Signed   By: Anner Crete M.D.   On: 10/06/2020 22:55      Assessment/Plan Principal Problem:   Atrial fibrillation with RVR (HCC) Active Problems:   Morbid obesity ([place])   Essential hypertension   Acute on chronic systolic CHF (congestive heart failure) ([place])   Cellulitis of left leg   Atrial fibrillation with rapid ventricular response ([place])    1. A. fib with RVR -patient states she has been compliant with her medications.  Presently on Cardizem infusion will continue home dose of metoprolol and try to wean off the Cardizem infusion.  Check TSH  cardiac markers.  Cardiology has been notified.  On Xarelto dose adjusted per pharmacy discussed with pharmacy about the dose.  2. Acute on chronic systolic heart failure last EF measured in August 2020 was 35 to 40% per report nonischemic cardiomyopathy appears to be decompensated presently on Lasix 40 mg IV likely decompensated due to A. fib with RVR.  Follow intake output Daily weights metabolic panel.  Patient is also on spironolactone.  Per report patient is supposed to be on Entresto not sure if patient was taking we we will check her home medication and discussed with pharmacy. 3. Bilateral lower extremity edema with possible cellulitis of the left lower extremity for which patient has been started on Ancef.  Will check Dopplers of lower extremity. 4. Hypertension on metoprolol. 5. Morbid obesity with BMI of 56.64 will consult nutrition.  6. Chronic kidney disease stage II creatinine appears to be at baseline.  Covid test is pending.  Since patient has acute CHF with A. fib with RVR and possible cellulitis will need close monitoring for any further worsening in inpatient status.   DVT prophylaxis: Xarelto. Code Status: Full code. Family Communication: Discussed with patient. Disposition Plan: Home. Consults called: Cardiology. Admission status: Inpatient.   Rise Patience MD [facility] Pager [phone].  If 7PM-7AM, please contact night-coverage [email or web address] Password [facility]  10/07/2020, 12:46 AM

## 2020-10-07 NOTE — Consult Note (Addendum)
CARDIOLOGY CONSULT NOTE  Patient ID: Lorraine Harris MRN: [id number] DOB/AGE: 03/23/1951 70 y.o.  Admit date: 10/06/2020 Attending physician: Kayleen Memos, DO Primary Physician:  Everardo Beals, NP Outpatient Cardiologist: Dr. Adrian Prows Inpatient Cardiologist: Rex Kras, DO, [facility]  Chief complaint: Rapid heart rate, sent in by urgent care Reason of consultation: A. fib with RVR Requesting/referring physician: Dr. Quintella Reichert, ER physician  HPI:  Lorraine Harris is a 70 y.o. Caucasian female who presents with a chief complaint of " rapid heart rate." Her past medical history and cardiovascular risk factors include: Morbid obesity, history of nonischemic cardiomyopathy with normal coronary arteries by left heart catheterization 09/20/2014, left bundle branch block, hypertension, bronchial asthma, depression, paroxysmal atrial fibrillation.  Patient was experiencing bilateral lower extremity swelling and also concerns for cellulitis and went to urgent care for further evaluation and management.  She was found to have tachycardia concerning for atrial fibrillation with rapid ventricular rate and was advised to go to the ER for further evaluation and management.  Cardiology was consulted for recommendations with regards to atrial fibrillation with rapid ventricular rate.  In the ER patient's blood pressure was 116/91, 97% on room air, respiration rate 26 breaths/min, heart rate 139 bpm.  Patient is overall asymptomatic and does not appreciate the rapid ventricular rate.  She denies any chest pain at rest or with effort related activities.  She has chronic dyspnea which has not increased in intensity, frequency, and/or duration.  She denies any heart failure symptoms.  Of note chronically, patient sleeps in a recliner as that she is unable to lay flat.  Bilateral lower extremity swelling with findings of erythema over the left lower extremity.  Due to the lower extremity swelling, D-dimers  were checked which were elevated.  She underwent a CT PE protocol while in the ER which was negative for pulmonary embolism.  Patient states that she has been compliant with her medications and is on Xarelto for thromboembolic prophylaxis.   She is positive for COVID-19 pneumonia.  Patient claims to be vaccinated for COVID-19.  ALLERGIES: Allergies  Allergen Reactions  . Morphine And Related Other (See Comments)    Seizure  . Valium [Diazepam] Other (See Comments)    "I can't walk"  . Latex Rash    PAST MEDICAL HISTORY: Past Medical History:  Diagnosis Date  . A-fib ([place])   . A-fib ([place])   . Asthma   . CHF (congestive heart failure) ([place])   . Chronic combined systolic and diastolic heart failure ([place]) 12/17/2018  . Hyperlipidemia   . Hypertension     PAST SURGICAL HISTORY: Past Surgical History:  Procedure Laterality Date  . LEFT HEART CATHETERIZATION WITH CORONARY ANGIOGRAM N/A 09/20/2014   Procedure: LEFT HEART CATHETERIZATION WITH CORONARY ANGIOGRAM;  Surgeon: Laverda Page, MD;  Location: [facility] CATH LAB;  Service: Cardiovascular;  Laterality: N/A;    FAMILY HISTORY: The patient family history includes Heart attack in her father; Heart disease in her mother and sister; Kidney disease in her father; Sleep apnea in her father.   SOCIAL HISTORY:  The patient  reports that she quit smoking about 10 years ago. She has a 30.00 pack-year smoking history. She has never used smokeless tobacco. She reports that she does not drink alcohol and does not use drugs.  MEDICATIONS: Current Outpatient Medications  Medication Instructions  . albuterol (PROVENTIL HFA;VENTOLIN HFA) 108 (90 BASE) MCG/ACT inhaler 1 puff, Inhalation, Every 6 hours PRN  . ALPRAZolam (XANAX) 0.5 mg, Oral,  3 times daily PRN  . atorvastatin (LIPITOR) 10 MG tablet TAKE 1 TABLET BY MOUTH EVERY DAY  . buPROPion (WELLBUTRIN XL) 150 mg, Oral, Daily  . diphenhydrAMINE (BENADRYL) 25 mg, Oral, Every 6 hours PRN  .  furosemide (LASIX) 40 MG tablet TAKE 1 TABLET BY MOUTH EVERY DAY  . metoprolol succinate (TOPROL-XL) 50 MG 24 hr tablet TAKE 1 TABLET BY MOUTH 2 TIMES A DAY. TAKE WITH OR IMMEDIATELY FOLLOWING A MEAL.  Marland Kitchen spironolactone (ALDACTONE) 25 MG tablet TAKE 1 TABLET BY MOUTH EVERY DAY IN THE MORNING  . venlafaxine (EFFEXOR) 75 mg, Oral, Daily  . XARELTO 15 MG TABS tablet TAKE 1 TABLET (15 MG TOTAL) BY MOUTH DAILY WITH SUPPER.    REVIEW OF SYSTEMS: Review of Systems  Constitutional: Positive for malaise/fatigue. Negative for chills and fever.  HENT: Negative for hoarse voice and nosebleeds.   Eyes: Negative for discharge, double vision and pain.  Cardiovascular: Positive for dyspnea on exertion (Chronic and stable) and leg swelling (With erythema over the left lower extremity). Negative for chest pain, claudication, near-syncope, orthopnea, palpitations, paroxysmal nocturnal dyspnea and syncope.  Respiratory: Negative for hemoptysis and shortness of breath.   Musculoskeletal: Negative for muscle cramps and myalgias.  Gastrointestinal: Negative for abdominal pain, constipation, diarrhea, hematemesis, hematochezia, melena, nausea and vomiting.  Neurological: Negative for dizziness and light-headedness.  All other systems reviewed and are negative.   PHYSICAL EXAM: Vitals with BMI 10/07/2020 10/07/2020 10/07/2020  Height - - -  Weight - - -  BMI - - -  Systolic [id number]  Diastolic 75 80 88  Pulse 69 80 64    No intake or output data in the 24 hours ending 10/07/20 1307  Net IO Since Admission: No IO data has been entered for this period [10/07/20 1307]  CONSTITUTIONAL: Appears older than stated age, morbidly obese, hemodynamically stable. No acute distress.  SKIN: Skin is warm and dry. No rash noted. No cyanosis. No pallor. No jaundice HEAD: Normocephalic and atraumatic.  EYES: No scleral icterus MOUTH/THROAT: Moist oral membranes.  NECK: Short neck and difficult to evaluate JVP.  No carotid  bruits. LYMPHATIC: No visible cervical adenopathy.  CHEST Normal respiratory effort. No intercostal retractions  LUNGS: Decreased breath sounds bilaterally.  Mild expiratory wheezes noted.  No stridor. No rales.  CARDIOVASCULAR: Irregularly irregular,variable Q000111Q, soft systolic ejection murmur, no gallops or rubs. ABDOMINAL: Morbidly obese, soft, nontender, nondistended.  No apparent ascites.  EXTREMITIES: Bilateral lower extremities.  Left lower extremity erythematous.  No open wounds appreciated. HEMATOLOGIC: No significant bruising NEUROLOGIC: Oriented to person, place, and time. Nonfocal. Normal muscle tone.  PSYCHIATRIC: Normal mood and affect. Normal behavior. Cooperative  RADIOLOGY: CT Angio Chest PE W/Cm &/Or Wo Cm  Result Date: 10/06/2020 CLINICAL DATA:  70 year old female with concern for pulmonary embolism. EXAM: CT ANGIOGRAPHY CHEST WITH CONTRAST TECHNIQUE: Multidetector CT imaging of the chest was performed using the standard protocol during bolus administration of intravenous contrast. Multiplanar CT image reconstructions and MIPs were obtained to evaluate the vascular anatomy. CONTRAST:  138mL OMNIPAQUE IOHEXOL 350 MG/ML SOLN COMPARISON:  Chest radiograph dated 03/05/2019. FINDINGS: Evaluation is limited due to streak artifact caused by patient's arms. Cardiovascular: There is mild cardiomegaly. No pericardial effusion. Coronary vascular calcification and calcification of the mitral annulus. There is retrograde flow of contrast from the right atrium into the IVC suggestive of a degree of right heart dysfunction. There is mild atherosclerotic calcification of the thoracic aorta. The aorta is tortuous. Mild dilatation of  the main pulmonary trunk suggestive of pulmonary hypertension. Clinical correlation is recommended. Evaluation of the pulmonary arteries is limited due to respiratory motion artifact. No large or central pulmonary artery embolus identified. Mediastinum/Nodes: No hilar or  mediastinal adenopathy. The esophagus is grossly unremarkable. No mediastinal fluid collection. Lungs/Pleura: Diffuse bilateral streaky and interstitial densities may represent atelectasis or atypical infection. Clinical correlation is recommended. No pleural effusion or pneumothorax. The central airways are patent. Upper Abdomen: Indeterminate 2.5 cm right renal upper pole hypodense lesion corresponding to the cyst seen on the prior CT. Musculoskeletal: Degenerative changes of the spine. No acute osseous pathology. Review of the MIP images confirms the above findings. IMPRESSION: 1. No CT evidence of central pulmonary artery embolus. 2. Mild cardiomegaly with findings of right heart dysfunction. Clinical correlation is recommended. 3. Diffuse bilateral streaky and interstitial densities may represent atelectasis or atypical infection. Clinical correlation is recommended. 4. Aortic Atherosclerosis (ICD10-I70.0). Electronically Signed   By: Anner Crete M.D.   On: 10/06/2020 22:55    LABORATORY DATA: Lab Results  Component Value Date   WBC 7.9 10/07/2020   HGB 12.3 10/07/2020   HCT 40.1 10/07/2020   MCV 84.6 10/07/2020   PLT 389 10/07/2020    Recent Labs  Lab 10/06/20 2054 10/07/20 0406  NA 139 134*  K 4.2 3.7  CL 99 100  CO2 26 26  BUN 29* 25*  CREATININE 1.39* 1.41*  CALCIUM 9.4 8.1*  PROT 7.2  --   BILITOT 0.3  --   ALKPHOS 58  --   ALT 15  --   AST 15  --   GLUCOSE 115* 116*    Lipid Panel     Component Value Date/Time   CHOL 126 03/08/2019 1052   TRIG 105 03/08/2019 1052   HDL 40 03/08/2019 1052   LDLCALC 65 03/08/2019 1052    BNP (last 3 results) Recent Labs    10/07/20 0000  BNP 611.0*    HEMOGLOBIN A1C No results found for: HGBA1C, MPG  Cardiac Panel (last 3 results) No results for input(s): CKTOTAL, CKMB, RELINDX in the last 8760 hours.  Invalid input(s): TROPONINHS  No results found for: CKTOTAL, CKMB, CKMBINDEX   TSH Recent Labs     10/07/20 0132  TSH 1.635      CARDIAC DATABASE: EKG: 10/06/2020: Atrial flutter, 146 bpm, left bundle branch block, left axis deviation.  10/07/2020: Atrial fibrillation, 83 bpm, left axis deviation, left bundle branch block, occasional PVCs.  Coronary angiogram 09/20/2014: No significant coronary artery disease. Left dominant circulation. Ascending aortogram normal. Anterior origin of a small RCA. LV not evaluated due to inability to cross AV  Lexiscan Myoview stress test 06/07/2019: Lexiscan stress test was with 2-day protocol was performed. Stress EKG is non-diagnostic, as this is pharmacological stress test. Rest and stress EKG reveal sinus rhythm and left bundle branch block.Tissue attenuation seen in both rest and stress images, owing to large body habitus (313 lb). SPECT images reveal small area of mild intensity perfusion defect in inferior/inferolateral myocardium. Decreased global myocardial wall thickening and wall motion. Stress LVEF 27%. TID is 1.21. High risk study.   Echo 04/22/2019:  1. The left ventricle has moderately reduced systolic function, with an ejection fraction of 35-40%. The cavity size was normal. Left ventricular diastolic Doppler parameters are consistent with impaired relaxation. 2. Right atrial size was mildly dilated. 3. The aortic valve is grossly normal. Mild-moderate stenosis of the aortic valve. Moderate aortic annular calcification noted. 4. The mitral valve  was not well visualized. There is moderate mitral annular calcification present. No evidence of mitral valve stenosis. 5. The aorta is normal unless otherwise noted. 6. The aortic root and ascending aorta are normal in size and structure. 7. Technically difficult echo with poor image quality. 8. The atrial septum is grossly normal.  Lower Extremity Venous Duplex  10/26/2019: RIGHT: - No evidence of common femoral vein obstruction.  LEFT: - There is no evidence of deep vein thrombosis in  the lower extremity.  - No cystic structure found in the popliteal fossa.  IMPRESSION & RECOMMENDATIONS: Lorraine Harris is a 70 y.o. Caucasian female whose past medical history and cardiovascular risk factors include:  Morbid obesity, history of nonischemic cardiomyopathy with normal coronary arteries by left heart catheterization 09/20/2014, left bundle branch block, hypertension, bronchial asthma, depression, paroxysmal atrial fibrillation.  Atrial flutter/fibrillation with rapid ventricular rate: Ventricular rate improving. Patient is currently on Cardizem, recommend weaning off Cardizem given her reduced LVEF. Increase her metoprolol succinate to 50 mg p.o. 3 times daily with holding parameters. Rhythm control: N/A. Thromboembolic prophylaxis: Xarelto, pharmacy to dose [id number]-VASc SCORE is 4 which correlates to 4% risk of stroke per year. Given the patient's presentation I presume that her atrial fibrillation/flutter with RVR is secondary to her COVID-19 pneumonia.  Recommend addressing the underlying condition and her ventricular rate should improve.  I would focus on rate control therapy. TSH within normal limits. Aggressive replacement of her electrolytes.  Would recommend a potassium of 4 and magnesium of 2 given her PVCs on the most recent EKG.  Chronic combined systolic and diastolic heart failure, stage C, NYHA class III:  Patient has chronic dyspnea due to multifactorial etiologies including nonischemic cardiomyopathy, heart failure, morbid obesity (BMI 56). Gentle diuresis given her renal insufficiency.  Strict I's and O's.  Daily weights. Continue guideline directed medical therapy.   Educated on the importance of medication compliance and improving her modifiable cardiovascular risk factors.  Lower extremity swelling: Patient is scheduled for lower extremity venous duplex.  Cellulitis management per primary team.  COVID-19 pneumonia: Currently hypoxic requiring nasal  cannula oxygen.  Patient is scheduled to receive  Remdesivir and Baricitinib per primary team.  Currently being managed by primary team.  From a cardiology standpoint patient can be discharged home once her ventricular rate is well controlled and oral medical therapy.  I suspect that her rhythm may transition back to normal sinus as her acute illness improves (hypoxia and COVID-19 pneumonia).  Recommend close outpatient follow-up with primary cardiologist Dr. Adrian Prows for longitudinal care of her chronic comorbid cardiovascular conditions.  Patient is agreeable with the plan of care.  Please call if any questions or concerns arise.  Continue your care given her other chronic comorbid conditions and active problems noted during this hospitalization.  Total encounter time 87 minutes. *Total Encounter Time as defined by the Centers for Medicare and Medicaid Services includes, in addition to the face-to-face time of a patient visit (documented in the note above) non-face-to-face time: obtaining and reviewing outside history, ordering and reviewing medications, tests or procedures, care coordination (communications with other health care professionals or caregivers) and documentation in the medical record.  Patient's questions and concerns were addressed to her satisfaction. She voices understanding of the instructions provided during this encounter.   This note was created using a voice recognition software as a result there may be grammatical errors inadvertently enclosed that do not reflect the nature of this encounter. Every attempt is made to  correct such errors.  Mechele Claude [facility]  Pager: [phone] Office: [phone] 10/07/2020, 1:07 PM

## 2020-10-07 NOTE — Progress Notes (Signed)
When the ED nurse Leonides Sake) called report on the patient for room 1424: I informed the nurse I was gowned up in a Covid room and I was not able to take report on the patient; I call the Ed nurse back and took report at 10:29.

## 2020-10-07 NOTE — ED Notes (Signed)
Attempted to call report on this pt, was refused

## 2020-10-07 NOTE — ED Notes (Signed)
Called report to [facility]

## 2020-10-07 NOTE — Progress Notes (Signed)
Lorraine Harris is a 70 y.o. female with history of chronic systolic heart failure last EF measured in August 2020 was 35 to 40% with history of paroxysmal atrial fibrillation hypertension chronic kidney disease stage II hyperlipidemia depression morbid obesity has been experiencing increasing bilateral lower extremity edema over the last 1 week and particularly on the left lower extremity patient also noticed some discharge and erythema.  Had gone to urgent care and over there patient was found to be tachycardic was referred to the ER.  Patient has chronic shortness of breath and denies any chest pain.  Denies any productive cough fever or chills.  Patient states she has been compliant with her medications.  ED Course: In the ER patient was found to be in A. fib with RVR and was started on Cardizem drip.  On exam patient's lower extremities are edematous with left lower extremity mildly erythematous and warm to touch concerning for cellulitis and was started on Ancef.  CT angiogram of the chest was negative for pulmonary embolism but does show features concerning for right-sided heart failure.  Patient admitted for A. fib with RVR with CHF and cellulitis of the lower extremities.  BNP is 611 high sensitive point was 15 creatinine 1.3 Covid test is pending.  Magnesium is low which we will replace.  10/07/20:  Seen and examined at her bedside in the ED.  Rate is controlled on cardizem drip.  New hypoxia on 3L with O2 sat in the mid 90's.  No work of breathing.  Covid-19 screening test came back positive.  She gives consent for Remdesivir and Baricitinib.  Will get MRSA screening test for her RLE cellulitis.  Please refer to H&P dictated by my partner Dr. Hal Hope on 10/07/2020 for further details of the assessment and plan.

## 2020-10-07 NOTE — ED Notes (Signed)
Attempted to call report to 4W, receiving nurse refused report. Will try again

## 2020-10-07 NOTE — ED Notes (Signed)
Patient states she is still having pain in her leg, MD paged for meds

## 2020-10-07 NOTE — ED Notes (Signed)
Attempted to call report, waiting on call back.Marland Kitchen

## 2020-10-07 NOTE — Progress Notes (Signed)
Flutters are on back order at this time. 

## 2020-10-07 NOTE — Progress Notes (Signed)
Pharmacy Antibiotic Note  Lorraine Harris is a 70 y.o. female admitted on 10/06/2020 with cellulitis.  Pharmacy has been consulted for Ancef dosing.  Scr 1.39, NCrCl ~53ml/min  Plan: Ancef 2gm IV q8h Pharmacy to sign off & monitor peripherally via electronic surveillance software  Height: 5\' 4"  (162.6 cm) Weight: (!) 149.7 kg (330 lb) IBW/kg (Calculated) : 54.7  Temp (24hrs), Avg:98.1 F (36.7 C), Min:98.1 F (36.7 C), Max:98.1 F (36.7 C)  Recent Labs  Lab 10/06/20 2054  WBC 8.4  CREATININE 1.39*    Estimated Creatinine Clearance: 55.1 mL/min (A) (by C-G formula based on SCr of 1.39 mg/dL (H)).    Allergies  Allergen Reactions  . Morphine And Related Other (See Comments)    Seizure  . Valium [Diazepam] Other (See Comments)    "I can't walk"  . Latex Rash    Thank you for allowing pharmacy to be a part of this patient's care.  Netta Cedars PharmD 10/07/2020 12:58 AM

## 2020-10-08 ENCOUNTER — Inpatient Hospital Stay (HOSPITAL_COMMUNITY): Payer: Medicare HMO

## 2020-10-08 DIAGNOSIS — U071 COVID-19: Secondary | ICD-10-CM | POA: Diagnosis not present

## 2020-10-08 LAB — CBC WITH DIFFERENTIAL/PLATELET
Abs Immature Granulocytes: 0.02 10*3/uL (ref 0.00–0.07)
Basophils Absolute: 0 10*3/uL (ref 0.0–0.1)
Basophils Relative: 0 %
Eosinophils Absolute: 0 10*3/uL (ref 0.0–0.5)
Eosinophils Relative: 0 %
HCT: 40.2 % (ref 36.0–46.0)
Hemoglobin: 12.2 g/dL (ref 12.0–15.0)
Immature Granulocytes: 0 %
Lymphocytes Relative: 9 %
Lymphs Abs: 0.4 10*3/uL — ABNORMAL LOW (ref 0.7–4.0)
MCH: 25.7 pg — ABNORMAL LOW (ref 26.0–34.0)
MCHC: 30.3 g/dL (ref 30.0–36.0)
MCV: 84.8 fL (ref 80.0–100.0)
Monocytes Absolute: 0.1 10*3/uL (ref 0.1–1.0)
Monocytes Relative: 2 %
Neutro Abs: 4.3 10*3/uL (ref 1.7–7.7)
Neutrophils Relative %: 89 %
Platelets: 392 10*3/uL (ref 150–400)
RBC: 4.74 MIL/uL (ref 3.87–5.11)
RDW: 19.4 % — ABNORMAL HIGH (ref 11.5–15.5)
WBC: 4.8 10*3/uL (ref 4.0–10.5)
nRBC: 0 % (ref 0.0–0.2)

## 2020-10-08 LAB — COMPREHENSIVE METABOLIC PANEL
ALT: 14 U/L (ref 0–44)
AST: 17 U/L (ref 15–41)
Albumin: 3.3 g/dL — ABNORMAL LOW (ref 3.5–5.0)
Alkaline Phosphatase: 62 U/L (ref 38–126)
Anion gap: 12 (ref 5–15)
BUN: 26 mg/dL — ABNORMAL HIGH (ref 8–23)
CO2: 28 mmol/L (ref 22–32)
Calcium: 8.7 mg/dL — ABNORMAL LOW (ref 8.9–10.3)
Chloride: 95 mmol/L — ABNORMAL LOW (ref 98–111)
Creatinine, Ser: 1.3 mg/dL — ABNORMAL HIGH (ref 0.44–1.00)
GFR, Estimated: 44 mL/min — ABNORMAL LOW (ref >60)
Glucose, Bld: 151 mg/dL — ABNORMAL HIGH (ref 70–99)
Potassium: 3.6 mmol/L (ref 3.5–5.1)
Sodium: 135 mmol/L (ref 135–145)
Total Bilirubin: 0.5 mg/dL (ref 0.3–1.2)
Total Protein: 6.9 g/dL (ref 6.5–8.1)

## 2020-10-08 LAB — MAGNESIUM: Magnesium: 2.4 mg/dL (ref 1.7–2.4)

## 2020-10-08 LAB — GLUCOSE, CAPILLARY
Glucose-Capillary: 122 mg/dL — ABNORMAL HIGH (ref 70–99)
Glucose-Capillary: 164 mg/dL — ABNORMAL HIGH (ref 70–99)
Glucose-Capillary: 218 mg/dL — ABNORMAL HIGH (ref 70–99)
Glucose-Capillary: 151 mg/dL — ABNORMAL HIGH (ref 70–99)

## 2020-10-08 LAB — C-REACTIVE PROTEIN: CRP: 4.7 mg/dL — ABNORMAL HIGH (ref <1.0)

## 2020-10-08 LAB — FERRITIN: Ferritin: 29 ng/mL (ref 11–307)

## 2020-10-08 LAB — PHOSPHORUS: Phosphorus: 3.7 mg/dL (ref 2.5–4.6)

## 2020-10-08 LAB — D-DIMER, QUANTITATIVE: D-Dimer, Quant: 0.79 ug/mL-FEU — ABNORMAL HIGH (ref 0.00–0.50)

## 2020-10-08 MED ORDER — OXYCODONE HCL 5 MG PO TABS
5.0000 mg | ORAL_TABLET | Freq: Four times a day (QID) | ORAL | Status: DC | PRN
  Administered 2020-10-08 – 2020-10-11 (×5): 5 mg via ORAL
  Filled 2020-10-08 (×5): qty 1

## 2020-10-08 MED ORDER — IPRATROPIUM-ALBUTEROL 20-100 MCG/ACT IN AERS
1.0000 | INHALATION_SPRAY | Freq: Three times a day (TID) | RESPIRATORY_TRACT | Status: DC
  Administered 2020-10-08 – 2020-10-13 (×17): 1 via RESPIRATORY_TRACT

## 2020-10-08 NOTE — Evaluation (Signed)
Physical Therapy Evaluation Patient Details Name: Lorraine Harris MRN: [id number] DOB: Jun 05, 1951 Today's Date: 10/08/2020   History of Present Illness  70yo female Patient admitted for A. fib with RVR with CHF and cellulitis of the lower extremities. PMH: CHF, HTN, morbid obesity  Clinical Impression  Pt admitted with above diagnosis. PT pleasant and cooperative, reports she is feeling well today. Pleased to be OOB. Pt will benefit from HHPT on d/c. Will continue to follow in acute setting.  Unable to check SpO2 d/t no dinamap in room, no finger probes, tele box not set to read O2.  Pt on 3L O2, incr WOB with OOB to chair, appears in NAD, HR 120s.  Pt currently with functional limitations due to the deficits listed below (see PT Problem List). Pt will benefit from skilled PT to increase their independence and safety with mobility to allow discharge to the venue listed below.       Follow Up Recommendations Home health PT    Equipment Recommendations  None recommended by PT    Recommendations for Other Services       Precautions / Restrictions Precautions Precautions: Fall Restrictions Weight Bearing Restrictions: No      Mobility  Bed Mobility Overal bed mobility: Needs Assistance Bed Mobility: Supine to Sit     Supine to sit: Min guard     General bed mobility comments: for safety and line management    Transfers Overall transfer level: Needs assistance Equipment used: Rolling walker (2 wheeled) Transfers: Sit to/from [facility] Sit to Stand: Min assist Stand pivot transfers: Min guard       General transfer comment: cues for hand placement, incr time  Ambulation/Gait Ambulation/Gait assistance: Min assist;Min guard Gait Distance (Feet): 4 Feet Assistive device: Rolling walker (2 wheeled) Gait Pattern/deviations: Wide base of support     General Gait Details: pt able to take a few steps forward and back to chair, fatigued but reports  minimal SOB, incr WOB noted with HR 120s. No O2 sat monitor present in room. pt with no finger probe or other device to monitor sats therefore did not attempt incr activity at this time  Stairs            Wheelchair Mobility    Modified Rankin (Stroke Patients Only)       Balance Overall balance assessment: Needs assistance   Sitting balance-Leahy Scale: Good       Standing balance-Leahy Scale: Poor Standing balance comment: reliant on UEs                             Pertinent Vitals/Pain Pain Assessment: No/denies pain    Home Living Family/patient expects to be discharged to:: Private residence Living Arrangements: Alone Available Help at Discharge: Family Type of Home: House Home Access: Ramped entrance     Home Layout: One level Home Equipment: Environmental consultant - 2 wheels;Cane - single point Additional Comments: son lives close by and checks on her    Prior Function Level of Independence: Independent;Independent with assistive device(s)         Comments: amb with cane or RW     Hand Dominance        Extremity/Trunk Assessment   Upper Extremity Assessment Upper Extremity Assessment: Defer to OT evaluation    Lower Extremity Assessment Lower Extremity Assessment: Overall WFL for tasks assessed       Communication   Communication: No difficulties  Cognition Arousal/Alertness:  Awake/alert Behavior During Therapy: WFL for tasks assessed/performed Overall Cognitive Status: Within Functional Limits for tasks assessed                                        General Comments      Exercises     Assessment/Plan    PT Assessment Patient needs continued PT services  PT Problem List Decreased mobility;Decreased activity tolerance;Decreased balance;Decreased knowledge of use of DME;Cardiopulmonary status limiting activity       PT Treatment Interventions DME instruction;Therapeutic activities;Gait training;Functional  mobility training;Therapeutic exercise;Patient/family education    PT Goals (Current goals can be found in the Care Plan section)  Acute Rehab PT Goals Patient Stated Goal: home soon PT Goal Formulation: With patient Time For Goal Achievement: 10/22/20 Potential to Achieve Goals: Good    Frequency Min 3X/week   Barriers to discharge        Co-evaluation               AM-PAC PT "6 Clicks" Mobility  Outcome Measure Help needed turning from your back to your side while in a flat bed without using bedrails?: A Little Help needed moving from lying on your back to sitting on the side of a flat bed without using bedrails?: A Little Help needed moving to and from a bed to a chair (including a wheelchair)?: A Little Help needed standing up from a chair using your arms (e.g., wheelchair or bedside chair)?: A Little Help needed to walk in hospital room?: A Little Help needed climbing 3-5 steps with a railing? : A Lot 6 Click Score: 17    End of Session   Activity Tolerance: Patient tolerated treatment well;Patient limited by fatigue Patient left: in chair;with call bell/phone within reach;with chair alarm set Nurse Communication: Mobility status PT Visit Diagnosis: Other abnormalities of gait and mobility (R26.89)    Time: 9833-8250 PT Time Calculation (min) (ACUTE ONLY): 17 min   Charges:   PT Evaluation $PT Eval Low Complexity: [place], PT  Acute Rehab Dept ([place]) [phone] Pager [phone]  10/08/2020   [facility] 10/08/2020, 1:47 PM

## 2020-10-08 NOTE — Progress Notes (Addendum)
Patient has numerous open sores which covers her entire body; some are bleeding and some are scab over; patient stated a long bug bit her and it was connected to a mole on her back.

## 2020-10-08 NOTE — Progress Notes (Signed)
Bilateral lower extremity venous study completed.      Please see CV Proc for preliminary results.   Seab Axel, RVT  

## 2020-10-08 NOTE — Progress Notes (Signed)
PROGRESS NOTE  Lorraine Harris:[id number] DOB: 04-May-1951 DOA: 10/06/2020 PCP: Everardo Beals, NP  HPI/Recap of past 24 hours: Lorraine Bolognese Richardsonis a 70 y.o.femalewithhistory of chronic systolic heart failure last EF 35 to 40%, paroxysmal atrial fibrillation, hypertension, chronic kidney disease stage II, hyperlipidemia, depression, morbid obesity has been experiencing increasing bilateral lower extremity edema over the last 1 week and particularly on the left lower extremity patient also noticed some discharge and erythema.  She has chronic shortness of breath and denies any chest pain.   Work-up in the ED revealed left lower extremity cellulitis, acute hypoxic respiratory failure, A. fib with RVR, COVID-19 viral pneumonia, and acute on chronic systolic CHF.  Seen by cardiology.  Started on COVID-19 directed therapies with the patient's own consent.   10/08/20:   Seen in her room.  She is sitting up in the chair.  She denies any chest pain.  Mild conversational dyspnea noted.  Assessment/Plan: Principal Problem:   Atrial fibrillation with RVR (HCC) Active Problems:   Morbid obesity ([place])   Essential hypertension   Acute on chronic systolic CHF (congestive heart failure) (HCC)   Cellulitis of left leg   Atrial fibrillation with rapid ventricular response (HCC)  Left lower extremity cellulitis, POA Check MRSA screening Started on cefazolin day #2, continue Follow blood cultures, negative to date. Currently afebrile with no leukocytosis.  COVID-19 viral pneumonia, poa COVID-19 screening test positive on 10/07/2020 Evidence of atypical pneumonia on CT chest. Continue to trend inflammatory markers  Acute hypoxic respiratory failure secondary to COVID-19 viral pneumonia acute on chronic systolic CHF. BNP 611, bilateral lower extremity edema Seen by cardiology Started on diuretics IV Lasix 40 mg daily, continue Started on COVID-19 directed therapies, continue  A. fib  with RVR Initially started on Cardizem drip Rate is currently controlled on Toprol-XL, continue To hold off Cardizem drip for now Obtain twelve-lead EKG  Acute on chronic systolic CHF Last 2D echo 04/22/2019 LVEF 35 to 40% Continue regimen as recommended by cardiology Continue strict I's and O's and daily weight Net I&O +1.7 L  Severe morbid obesity BMI 56 Recommend weight loss outpatient with regular physical activity and healthy dieting.  CKD 3B Appears to be at her baseline creatinine 1.3 with GFR 44 Avoid nephrotoxins and monitor urine output      Code Status: Full code.  Family Communication: Bedside.  Disposition Plan: Likely discharge to home with home health services.   Consultants:  Cardiology  Procedures:  None.  Antimicrobials:  Cefazolin, started on 10/07/20.  DVT prophylaxis: Xarelto.  Status is: Inpatient    Dispo:  Patient From: Home  Planned Disposition: Home with Health Care Svc  Expected discharge date: 10/11/2020  Medically stable for discharge: No, ongoing management of left lower extremity cellulitis, A. fib with RVR, acute hypoxia, acute on chronic systolic CHF.          Objective: Vitals:   10/08/20 0257 10/08/20 0300 10/08/20 0400 10/08/20 1419  BP: (!) 80/52 95/75  113/65  Pulse: 77 62  89  Resp:  17  20  Temp:   97.6 F (36.4 C) 97.8 F (36.6 C)  TempSrc:   Oral Oral  SpO2: 93% 90%    Weight:      Height:        Intake/Output Summary (Last 24 hours) at 10/08/2020 1618 Last data filed at 10/08/2020 0330 Gross per 24 hour  Intake --  Output 3 ml  Net -3 ml   [employer]  10/06/20 2007  Weight: (!) 149.7 kg    Exam:  . General: 70 y.o. year-old female well developed well nourished in no acute distress.  Alert and oriented x3. . Cardiovascular: Regular rate and rhythm with no rubs or gallops.  No thyromegaly or JVD noted.   Marland Kitchen Respiratory: Clear to auscultation with no wheezes or rales. Good inspiratory  effort. . Abdomen: Soft nontender nondistended with normal bowel sounds x4 quadrants. . Musculoskeletal: No lower extremity edema. 2/4 pulses in all 4 extremities. . Skin: No ulcerative lesions noted or rashes, . Psychiatry: Mood is appropriate for condition and setting   Data Reviewed: CBC: Recent Labs  Lab 10/06/20 2054 10/07/20 0406 10/08/20 0445  WBC 8.4 7.9 4.8  NEUTROABS 5.6  --  4.3  HGB 13.3 12.3 12.2  HCT 43.3 40.1 40.2  MCV 83.3 84.6 84.8  PLT 445* 389 0000000   Basic Metabolic Panel: Recent Labs  Lab 10/06/20 2054 10/07/20 0132 10/07/20 0406 10/08/20 0445  NA 139  --  134* 135  K 4.2  --  3.7 3.6  CL 99  --  100 95*  CO2 26  --  26 28  GLUCOSE 115*  --  116* 151*  BUN 29*  --  25* 26*  CREATININE 1.39*  --  1.41* 1.30*  CALCIUM 9.4  --  8.1* 8.7*  MG 1.8 1.4*  --  2.4  PHOS  --   --   --  3.7   GFR: Estimated Creatinine Clearance: 58.9 mL/min (A) (by C-G formula based on SCr of 1.3 mg/dL (H)). Liver Function Tests: Recent Labs  Lab 10/06/20 2054 10/08/20 0445  AST 15 17  ALT 15 14  ALKPHOS 58 62  BILITOT 0.3 0.5  PROT 7.2 6.9  ALBUMIN 3.5 3.3*   No results for input(s): LIPASE, AMYLASE in the last 168 hours. No results for input(s): AMMONIA in the last 168 hours. Coagulation Profile: No results for input(s): INR, PROTIME in the last 168 hours. Cardiac Enzymes: No results for input(s): CKTOTAL, CKMB, CKMBINDEX, TROPONINI in the last 168 hours. BNP (last 3 results) No results for input(s): PROBNP in the last 8760 hours. HbA1C: No results for input(s): HGBA1C in the last 72 hours. CBG: Recent Labs  Lab 10/07/20 1125 10/07/20 1727 10/07/20 1928 10/08/20 0746 10/08/20 1124  GLUCAP 124* 139* 153* 122* 164*   Lipid Profile: No results for input(s): CHOL, HDL, LDLCALC, TRIG, CHOLHDL, LDLDIRECT in the last 72 hours. Thyroid Function Tests: Recent Labs    10/07/20 0132  TSH 1.635   Anemia Panel: Recent Labs    10/08/20 0445  FERRITIN  29   Urine analysis:    Component Value Date/Time   COLORURINE YELLOW 06/19/2013 0304   APPEARANCEUR CLEAR 06/19/2013 0304   LABSPEC 1.032 (H) 06/19/2013 0304   PHURINE 5.0 06/19/2013 0304   GLUCOSEU NEGATIVE 06/19/2013 0304   HGBUR NEGATIVE 06/19/2013 0304   BILIRUBINUR NEGATIVE 06/19/2013 0304   KETONESUR NEGATIVE 06/19/2013 0304   PROTEINUR NEGATIVE 06/19/2013 0304   UROBILINOGEN 0.2 06/19/2013 0304   NITRITE NEGATIVE 06/19/2013 0304   LEUKOCYTESUR NEGATIVE 06/19/2013 0304   Sepsis Labs: @LABRCNTIP (procalcitonin:4,lacticidven:4)  ) Recent Results (from the past 240 hour(s))  SARS CORONAVIRUS 2 (TAT 6-24 HRS) Nasopharyngeal Nasopharyngeal Swab     Status: Abnormal   Collection Time: 10/07/20 12:00 AM   Specimen: Nasopharyngeal Swab  Result Value Ref Range Status   SARS Coronavirus 2 POSITIVE (A) NEGATIVE Final    Comment: (NOTE) SARS-CoV-2 target nucleic acids  are DETECTED.  The SARS-CoV-2 RNA is generally detectable in upper and lower respiratory specimens during the acute phase of infection. Positive results are indicative of the presence of SARS-CoV-2 RNA. Clinical correlation with patient history and other diagnostic information is  necessary to determine patient infection status. Positive results do not rule out bacterial infection or co-infection with other viruses.  The expected result is Negative.  Fact Sheet for Patients: SugarRoll.be  Fact Sheet for Healthcare Providers: https://www.woods-mathews.com/  This test is not yet approved or cleared by the [place] FDA and  has been authorized for detection and/or diagnosis of SARS-CoV-2 by FDA under an Emergency Use Authorization (EUA). This EUA will remain  in effect (meaning this test can be used) for the duration of the COVID-19 declaration under Section 564(b)(1) of the Act, 21 U. S.C. section 360bbb-3(b)(1), unless the authorization is terminated or revoked  sooner.   Performed at [facility], [place] [address] [place], [place] [address]   Culture, blood (routine x 2)     Status: None (Preliminary result)   Collection Time: 10/07/20  1:32 AM   Specimen: BLOOD  Result Value Ref Range Status   Specimen Description   Final    BLOOD LEFT ANTECUBITAL Performed at [facility] [address], [place], [place] [address]    Special Requests   Final    BOTTLES DRAWN AEROBIC ONLY Blood Culture results may not be optimal due to an inadequate volume of blood received in culture bottles Performed at [facility] [address], [place], [place] [address]    Culture   Final    NO GROWTH 1 DAY Performed at [facility], [place] [address], [place], [place] [address]    Report Status PENDING  Incomplete  Culture, blood (routine x 2)     Status: None (Preliminary result)   Collection Time: 10/07/20  1:33 AM   Specimen: BLOOD  Result Value Ref Range Status   Specimen Description   Final    BLOOD RIGHT WRIST Performed at [facility] [address] [place], [place] [address]    Special Requests   Final    BOTTLES DRAWN AEROBIC AND ANAEROBIC Blood Culture results may not be optimal due to an inadequate volume of blood received in culture bottles Performed at [facility] [address] [place], [place] [address]    Culture   Final    NO GROWTH 1 DAY Performed at [facility], [place] [address] [place], [place] [address]    Report Status PENDING  Incomplete      Studies: VAS [place] LOWER EXTREMITY VENOUS (DVT)  Result Date: 10/08/2020  Lower Venous DVT Study Other Indications: Covid. Limitations: Body habitus. Comparison Study: No previous exam Performing Technologist: Vonzell Schlatter  Examination Guidelines: A complete evaluation includes B-mode imaging, spectral Doppler, color Doppler, and power Doppler as needed of all accessible portions of each vessel.  Bilateral testing is considered an integral part of a complete examination. Limited examinations for reoccurring indications may be performed as noted. The reflux portion of the exam is performed with the patient in reverse Trendelenburg.  +---------+---------------+---------+-----------+----------+--------------+ RIGHT    CompressibilityPhasicitySpontaneityPropertiesThrombus Aging +---------+---------------+---------+-----------+----------+--------------+ CFV      Full           Yes      Yes                                 +---------+---------------+---------+-----------+----------+--------------+  SFJ      Full                                                        +---------+---------------+---------+-----------+----------+--------------+ FV Prox  Full                                                        +---------+---------------+---------+-----------+----------+--------------+ FV Mid   Full                                                        +---------+---------------+---------+-----------+----------+--------------+ FV DistalFull                                                        +---------+---------------+---------+-----------+----------+--------------+ PFV      Full                                                        +---------+---------------+---------+-----------+----------+--------------+ POP      Full           Yes      Yes                                 +---------+---------------+---------+-----------+----------+--------------+ PTV      Full                                                        +---------+---------------+---------+-----------+----------+--------------+ PERO     Full                                                        +---------+---------------+---------+-----------+----------+--------------+   +---------+---------------+---------+-----------+----------+--------------+ LEFT      CompressibilityPhasicitySpontaneityPropertiesThrombus Aging +---------+---------------+---------+-----------+----------+--------------+ CFV      Full           Yes      Yes                                 +---------+---------------+---------+-----------+----------+--------------+ SFJ      Full                                                        +---------+---------------+---------+-----------+----------+--------------+  FV Prox  Full                                                        +---------+---------------+---------+-----------+----------+--------------+ FV Mid   Full                                                        +---------+---------------+---------+-----------+----------+--------------+ FV DistalFull                                                        +---------+---------------+---------+-----------+----------+--------------+ PFV      Full                                                        +---------+---------------+---------+-----------+----------+--------------+ POP      Full           Yes      Yes                                 +---------+---------------+---------+-----------+----------+--------------+ PTV      Full                                                        +---------+---------------+---------+-----------+----------+--------------+ PERO     Full                                                        +---------+---------------+---------+-----------+----------+--------------+     Summary: BILATERAL: - No evidence of deep vein thrombosis seen in the lower extremities, bilaterally. -No evidence of popliteal cyst, bilaterally.   *See table(s) above for measurements and observations. Electronically signed by Monica Martinez MD on 10/08/2020 at 2:32:49 PM.    Final     Scheduled Meds: . vitamin C  500 mg Oral Daily  . atorvastatin  10 mg Oral Daily  . baricitinib  2 mg Oral Daily  . buPROPion  150 mg Oral  Daily  . cholecalciferol  1,000 Units Oral Daily  . furosemide  40 mg Intravenous Daily  . insulin aspart  0-15 Units Subcutaneous TID WC  . Ipratropium-Albuterol  1 puff Inhalation TID  . metoprolol succinate  50 mg Oral TID  . multivitamin with minerals  1 tablet Oral Daily  . [START ON 10/10/2020] predniSONE  50 mg Oral Daily  . rivaroxaban  20 mg Oral Q supper  . spironolactone  25 mg Oral Daily  . venlafaxine  75 mg Oral  Daily  . zinc sulfate  220 mg Oral Daily    Continuous Infusions: .  ceFAZolin (ANCEF) IV 2 g (10/08/20 1557)  . diltiazem (CARDIZEM) infusion Stopped (10/08/20 0259)  . methylPREDNISolone (SOLU-MEDROL) injection 150 mg (10/08/20 1045)  . remdesivir 100 mg in NS 100 mL 100 mg (10/08/20 0915)     LOS: 1 day     Kayleen Memos, MD [facility] Pager [phone]  If 7PM-7AM, please contact night-coverage [email or web address] Password [email or web address] 10/08/2020, 4:18 PM

## 2020-10-09 ENCOUNTER — Inpatient Hospital Stay (HOSPITAL_COMMUNITY): Payer: Medicare HMO

## 2020-10-09 LAB — ECHOCARDIOGRAM LIMITED
AR max vel: 1.58 cm2
AV Area VTI: 1.32 cm2
AV Area mean vel: 1.63 cm2
AV Mean grad: 6.5 mmHg
AV Peak grad: 12.7 mmHg
Ao pk vel: 1.79 m/s
Calc EF: 42.7 %
Height: 64 in
Single Plane A2C EF: 42.4 %
Single Plane A4C EF: 41.8 %
Weight: 5142.89 oz

## 2020-10-09 LAB — CBC WITH DIFFERENTIAL/PLATELET
Abs Immature Granulocytes: 0.06 10*3/uL (ref 0.00–0.07)
Basophils Absolute: 0 10*3/uL (ref 0.0–0.1)
Basophils Relative: 0 %
Eosinophils Absolute: 0 10*3/uL (ref 0.0–0.5)
Eosinophils Relative: 0 %
HCT: 38.1 % (ref 36.0–46.0)
Hemoglobin: 11.8 g/dL — ABNORMAL LOW (ref 12.0–15.0)
Immature Granulocytes: 1 %
Lymphocytes Relative: 6 %
Lymphs Abs: 0.5 10*3/uL — ABNORMAL LOW (ref 0.7–4.0)
MCH: 25.8 pg — ABNORMAL LOW (ref 26.0–34.0)
MCHC: 31 g/dL (ref 30.0–36.0)
MCV: 83.4 fL (ref 80.0–100.0)
Monocytes Absolute: 0.3 10*3/uL (ref 0.1–1.0)
Monocytes Relative: 4 %
Neutro Abs: 7.9 10*3/uL — ABNORMAL HIGH (ref 1.7–7.7)
Neutrophils Relative %: 89 %
Platelets: 295 10*3/uL (ref 150–400)
RBC: 4.57 MIL/uL (ref 3.87–5.11)
RDW: 19 % — ABNORMAL HIGH (ref 11.5–15.5)
WBC: 8.8 10*3/uL (ref 4.0–10.5)
nRBC: 0 % (ref 0.0–0.2)

## 2020-10-09 LAB — COMPREHENSIVE METABOLIC PANEL
ALT: 11 U/L (ref 0–44)
AST: 14 U/L — ABNORMAL LOW (ref 15–41)
Albumin: 3.1 g/dL — ABNORMAL LOW (ref 3.5–5.0)
Alkaline Phosphatase: 54 U/L (ref 38–126)
Anion gap: 14 (ref 5–15)
BUN: 30 mg/dL — ABNORMAL HIGH (ref 8–23)
CO2: 28 mmol/L (ref 22–32)
Calcium: 8.6 mg/dL — ABNORMAL LOW (ref 8.9–10.3)
Chloride: 94 mmol/L — ABNORMAL LOW (ref 98–111)
Creatinine, Ser: 1.25 mg/dL — ABNORMAL HIGH (ref 0.44–1.00)
GFR, Estimated: 46 mL/min — ABNORMAL LOW (ref >60)
Glucose, Bld: 165 mg/dL — ABNORMAL HIGH (ref 70–99)
Potassium: 3.7 mmol/L (ref 3.5–5.1)
Sodium: 136 mmol/L (ref 135–145)
Total Bilirubin: 0.5 mg/dL (ref 0.3–1.2)
Total Protein: 6 g/dL — ABNORMAL LOW (ref 6.5–8.1)

## 2020-10-09 LAB — D-DIMER, QUANTITATIVE: D-Dimer, Quant: 0.81 ug/mL-FEU — ABNORMAL HIGH (ref 0.00–0.50)

## 2020-10-09 LAB — C-REACTIVE PROTEIN: CRP: 2.2 mg/dL — ABNORMAL HIGH (ref <1.0)

## 2020-10-09 LAB — FERRITIN: Ferritin: 23 ng/mL (ref 11–307)

## 2020-10-09 LAB — MRSA PCR SCREENING: MRSA by PCR: NEGATIVE

## 2020-10-09 LAB — GLUCOSE, CAPILLARY
Glucose-Capillary: 253 mg/dL — ABNORMAL HIGH (ref 70–99)
Glucose-Capillary: 140 mg/dL — ABNORMAL HIGH (ref 70–99)
Glucose-Capillary: 184 mg/dL — ABNORMAL HIGH (ref 70–99)

## 2020-10-09 LAB — MAGNESIUM: Magnesium: 2.4 mg/dL (ref 1.7–2.4)

## 2020-10-09 LAB — PHOSPHORUS: Phosphorus: 3.2 mg/dL (ref 2.5–4.6)

## 2020-10-09 MED ORDER — PERFLUTREN LIPID MICROSPHERE
1.0000 mL | INTRAVENOUS | Status: AC | PRN
  Administered 2020-10-09: 2 mL via INTRAVENOUS
  Filled 2020-10-09: qty 10

## 2020-10-09 NOTE — Consult Note (Signed)
WOC Nurse Consult Note: Reason for Consult:Patient with edema and nonintact lesion to left anterior lower leg.  She has been treating at home with calamine lotion.  CHF exacerbation with increased edema to lower legs.  Wound type:infectious Pressure Injury POA: NA Measurement: 3 cm x 2 cmx 0.2 cm with scattered patches of intact skin in places.   Wound WNI:OEVO pink with macerated edges Drainage (amount, consistency, odor) minimal oozing  Serous  No odor.  Periwound:edema and erythema to left leg.  Dressing procedure/placement/frequency:Cleanse wound to left leg with NS and pat dry.  Apply Aquacel Ag to open wound (LAWSON # [id number])  Cover with gauze  Wrap with kerlix and ace bandage.  Change Mon/Wed/Fri.    Will not follow at this time.  Please re-consult if needed.  Domenic Moras MSN, RN, FNP-BC [facility]ON Wound, Ostomy, Continence Nurse Pager [phone]

## 2020-10-09 NOTE — Progress Notes (Signed)
  Echocardiogram 2D Echocardiogram limited with definity has been performed.  Darlina Sicilian M 10/09/2020, 12:17 PM

## 2020-10-09 NOTE — Progress Notes (Signed)
PROGRESS NOTE  Lorraine Harris [id number] DOB: [birth year] DOA: 10/06/2020 PCP: Everardo Beals, NP  HPI/Recap of past 24 hours: Lorraine Bolognese Richardsonis a 70 y.o.femalewithhistory of chronic systolic heart failure last EF 35 to 40%, paroxysmal atrial fibrillation, hypertension, chronic kidney disease stage II, hyperlipidemia, depression, morbid obesity has been experiencing increasing bilateral lower extremity edema over the last 1 week and particularly on the left lower extremity patient also noticed some discharge and erythema.  She has chronic shortness of breath and denies any chest pain.   Work-up in the ED revealed left lower extremity cellulitis, acute hypoxic respiratory failure, A. fib with RVR, COVID-19 viral pneumonia, and acute on chronic systolic CHF.  Seen by cardiology.  Started on COVID-19 directed therapies with the patient's own consent.   10/09/20:   Seen and examined at her bedside.  She is sitting up in chair.  She denies any pain in her left lower extremity however states this has been draining.  Assessment/Plan: Principal Problem:   Atrial fibrillation with RVR (HCC) Active Problems:   Morbid obesity ([place])   Essential hypertension   Acute on chronic systolic CHF (congestive heart failure) ([place])   Cellulitis of left leg   Atrial fibrillation with rapid ventricular response (HCC)  Left lower extremity cellulitis, POA Check MRSA screen. Started on cefazolin day #3, continue Follow blood cultures, negative to date. Currently afebrile with no leukocytosis.  COVID-19 viral pneumonia, poa COVID-19 screening test positive on 10/07/2020 Evidence of atypical pneumonia on CT chest. Inflammatory markers are downtrending. Continue to trend inflammatory markers  Improving acute hypoxic respiratory failure secondary to COVID-19 viral pneumonia acute on chronic systolic CHF. BNP 611, bilateral lower extremity edema Initially requiring 4 to 6 L, now down to 2 L with  O2 saturation in the mid 90s. Continue diuretics IV Lasix 40 mg daily Continue COVID-19 directed therapies Remdesivir day #3 Baricitinib day #3  A. fib with RVR Initially started on Cardizem drip, stopped on 10/08/2020. Continue Toprol-XL Continue to monitor on telemetry. Continue Xarelto for CVA prevention.  Acute on chronic systolic CHF Last 2D echo 04/22/2019 LVEF 35 to 40% Continue regimen as recommended by cardiology Continue strict I's and O's and daily weight Net I&O positive 954CC  Severe morbid obesity BMI 56 Recommend weight loss outpatient with regular physical activity and healthy dieting.  CKD 3B Back to her baseline creatinine 1.2 with GFR 46. Continue to avoid nephrotoxins and monitor urine output      Code Status: Full code.  Family Communication: None at bedside.  Disposition Plan: Likely discharge to home with home health services.   Consultants:  Cardiology  Procedures:  None.  Antimicrobials:  Cefazolin, started on 10/07/20.  DVT prophylaxis: Xarelto.  Status is: Inpatient    Dispo:  Patient From: Home  Planned Disposition: Home with Health Care Svc  Expected discharge date: 10/10/2020  Medically stable for discharge: No, ongoing management of left lower extremity cellulitis, A. fib with RVR, acute hypoxia, acute on chronic systolic CHF.          Objective: Vitals:   10/08/20 2146 10/09/20 0400 10/09/20 0652 10/09/20 1305  BP: 105/81  90/76 96/68  Pulse: 94  76 64  Resp: 20  18 20   Temp: 97.7 F (36.5 C)  97.7 F (36.5 C) 97.9 F (36.6 C)  TempSrc: Oral  Oral Oral  SpO2: 95%  94% 96%  Weight:  (!) 145.8 kg    Height:        Intake/Output Summary (  Last 24 hours) at 10/09/2020 1503 Last data filed at 10/09/2020 0454 Gross per 24 hour  Intake 600 ml  Output 1400 ml  Net -800 ml   Filed Weights   10/06/20 2007 10/09/20 0400  Weight: (!) 149.7 kg (!) 145.8 kg    Exam:  . General: 70 y.o. year-old female obese in no  acute distress.  Alert oriented x3.   . Cardiovascular: Irregular rate and rhythm no rubs or gallops. Marland Kitchen Respiratory: Mild rales at bases no wheezing noted.  Good inspiratory effort. . Abdomen: Obese nontender bowel sounds present.   . Musculoskeletal: 2+ pitting edema in lower extremities bilaterally.  Left lower extremity is wrapped with dressing. Marland Kitchen Psychiatry: Mood is appropriate for condition and setting.   Data Reviewed: CBC: Recent Labs  Lab 10/06/20 2054 10/07/20 0406 10/08/20 0445 10/09/20 0337  WBC 8.4 7.9 4.8 8.8  NEUTROABS 5.6  --  4.3 7.9*  HGB 13.3 12.3 12.2 11.8*  HCT 43.3 40.1 40.2 38.1  MCV 83.3 84.6 84.8 83.4  PLT 445* 389 392 [id number]   Basic Metabolic Panel: Recent Labs  Lab 10/06/20 2054 10/07/20 0132 10/07/20 0406 10/08/20 0445 10/09/20 0337  NA 139  --  134* 135 136  K 4.2  --  3.7 3.6 3.7  CL 99  --  100 95* 94*  CO2 26  --  26 28 28   GLUCOSE 115*  --  116* 151* 165*  BUN 29*  --  25* 26* 30*  CREATININE 1.39*  --  1.41* 1.30* 1.25*  CALCIUM 9.4  --  8.1* 8.7* 8.6*  MG 1.8 1.4*  --  2.4 2.4  PHOS  --   --   --  3.7 3.2   GFR: Estimated Creatinine Clearance: 60.2 mL/min (A) (by C-G formula based on SCr of 1.25 mg/dL (H)). Liver Function Tests: Recent Labs  Lab 10/06/20 2054 10/08/20 0445 10/09/20 0337  AST 15 17 14*  ALT 15 14 11   ALKPHOS 58 62 54  BILITOT 0.3 0.5 0.5  PROT 7.2 6.9 6.0*  ALBUMIN 3.5 3.3* 3.1*   No results for input(s): LIPASE, AMYLASE in the last 168 hours. No results for input(s): AMMONIA in the last 168 hours. Coagulation Profile: No results for input(s): INR, PROTIME in the last 168 hours. Cardiac Enzymes: No results for input(s): CKTOTAL, CKMB, CKMBINDEX, TROPONINI in the last 168 hours. BNP (last 3 results) No results for input(s): PROBNP in the last 8760 hours. HbA1C: No results for input(s): HGBA1C in the last 72 hours. CBG: Recent Labs  Lab 10/08/20 0746 10/08/20 1124 10/08/20 1635 10/08/20 2149  10/09/20 1110  GLUCAP 122* 164* 218* 151* 253*   Lipid Profile: No results for input(s): CHOL, HDL, LDLCALC, TRIG, CHOLHDL, LDLDIRECT in the last 72 hours. Thyroid Function Tests: Recent Labs    10/07/20 0132  TSH 1.635   Anemia Panel: Recent Labs    10/08/20 0445 10/09/20 0337  FERRITIN 29 23   Urine analysis:    Component Value Date/Time   COLORURINE YELLOW 06/19/2013 0304   APPEARANCEUR CLEAR 06/19/2013 0304   LABSPEC 1.032 (H) 06/19/2013 0304   PHURINE 5.0 06/19/2013 0304   GLUCOSEU NEGATIVE 06/19/2013 0304   HGBUR NEGATIVE 06/19/2013 0304   BILIRUBINUR NEGATIVE 06/19/2013 0304   KETONESUR NEGATIVE 06/19/2013 0304   PROTEINUR NEGATIVE 06/19/2013 0304   UROBILINOGEN 0.2 06/19/2013 0304   NITRITE NEGATIVE 06/19/2013 0304   LEUKOCYTESUR NEGATIVE 06/19/2013 0304   Sepsis Labs: @LABRCNTIP (procalcitonin:4,lacticidven:4)  ) Recent Results (from the past  240 hour(s))  SARS CORONAVIRUS 2 (TAT 6-24 HRS) Nasopharyngeal Nasopharyngeal Swab     Status: Abnormal   Collection Time: 10/07/20 12:00 AM   Specimen: Nasopharyngeal Swab  Result Value Ref Range Status   SARS Coronavirus 2 POSITIVE (A) NEGATIVE Final    Comment: (NOTE) SARS-CoV-2 target nucleic acids are DETECTED.  The SARS-CoV-2 RNA is generally detectable in upper and lower respiratory specimens during the acute phase of infection. Positive results are indicative of the presence of SARS-CoV-2 RNA. Clinical correlation with patient history and other diagnostic information is  necessary to determine patient infection status. Positive results do not rule out bacterial infection or co-infection with other viruses.  The expected result is Negative.  Fact Sheet for Patients: SugarRoll.be  Fact Sheet for Healthcare Providers: https://www.woods-mathews.com/  This test is not yet approved or cleared by the [place] FDA and  has been authorized for detection and/or  diagnosis of SARS-CoV-2 by FDA under an Emergency Use Authorization (EUA). This EUA will remain  in effect (meaning this test can be used) for the duration of the COVID-19 declaration under Section 564(b)(1) of the Act, 21 U. S.C. section 360bbb-3(b)(1), unless the authorization is terminated or revoked sooner.   Performed at [facility], [place] [address], [place], [place] [address]   Culture, blood (routine x 2)     Status: None (Preliminary result)   Collection Time: 10/07/20  1:32 AM   Specimen: BLOOD  Result Value Ref Range Status   Specimen Description   Final    BLOOD LEFT ANTECUBITAL Performed at [facility] [address] [place], [place] [address]    Special Requests   Final    BOTTLES DRAWN AEROBIC ONLY Blood Culture results may not be optimal due to an inadequate volume of blood received in culture bottles Performed at [facility] [address], [place], [place] [address]    Culture   Final    NO GROWTH 2 DAYS Performed at [facility] [address], [place], [place] [address]    Report Status PENDING  Incomplete  Culture, blood (routine x 2)     Status: None (Preliminary result)   Collection Time: 10/07/20  1:33 AM   Specimen: BLOOD  Result Value Ref Range Status   Specimen Description   Final    BLOOD RIGHT WRIST Performed at [facility] [address] [place], [place] [address]    Special Requests   Final    BOTTLES DRAWN AEROBIC AND ANAEROBIC Blood Culture results may not be optimal due to an inadequate volume of blood received in culture bottles Performed at [facility] [address], [place], [place] [address]    Culture   Final    NO GROWTH 2 DAYS Performed at [facility] [address] [place], [place] [address]    Report Status PENDING  Incomplete      Studies: ECHOCARDIOGRAM LIMITED  Result Date: 10/09/2020    ECHOCARDIOGRAM LIMITED  REPORT   Patient Name:   DANECIA ROULETTE Date of Exam: 10/09/2020 Medical Rec #:  [id number]           Height:       64.0 in Accession #:    [id number]          Weight:       321.4 lb Date of Birth:  [birth year]            BSA:  2.393 m Patient Age:    72 years            BP:           90/76 mmHg Patient Gender: F                   HR:           118 bpm. Exam Location:  Inpatient Procedure: Limited Echo, Limited Color Doppler, Cardiac Doppler and Intracardiac            Opacification Agent Indications:     CHF-Acute Systolic [id number]  History:         Patient has prior history of Echocardiogram examinations, most                  recent 04/22/2019. Arrythmias:Atrial Fibrillation; Risk                  Factors:Hypertension and Dyslipidemia. COVID 19.  Sonographer:     Darlina Sicilian RDCS Referring Phys:  [id number] [place] Diagnosing Phys: Adrian Prows MD  Sonographer Comments: Suboptimal parasternal window and Technically difficult study due to poor echo windows. IMPRESSIONS  1. Left ventricular ejection fraction, by estimation, is 35 to 40%. Left ventricular ejection fraction by 2D MOD biplane is 42.7 %. The left ventricle has mild to moderately decreased function. The left ventricle demonstrates global hypokinesis. Left ventricular diastolic parameters are indeterminate.  2. Right ventricular systolic function was not well visualized. There is moderately elevated pulmonary artery systolic pressure. The estimated right ventricular systolic pressure is 99991111 mmHg.  3. Left atrial size was moderately dilated.  4. The mitral valve is grossly normal. Trivial mitral valve regurgitation.  5. The aortic valve is calcified. Aortic valve regurgitation is not visualized. Mild aortic valve stenosis.  6. The inferior vena cava is dilated in size with <50% respiratory variability, suggesting right atrial pressure of 15 mmHg. Comparison(s): Compared to 04/22/2019, no significant change in EF 35-40%$. FINDINGS  Left Ventricle:  Left ventricular ejection fraction, by estimation, is 35 to 40%. Left ventricular ejection fraction by 2D MOD biplane is 42.7 %. The left ventricle has mild to moderately decreased function. The left ventricle demonstrates global hypokinesis. Definity contrast agent was given IV to delineate the left ventricular endocardial borders. The left ventricular internal cavity size was normal in size. There is no left ventricular hypertrophy. Abnormal (paradoxical) septal motion, consistent with left bundle branch block. Left ventricular diastolic parameters are indeterminate. Right Ventricle: Right ventricular systolic function was not well visualized. There is moderately elevated pulmonary artery systolic pressure. The tricuspid regurgitant velocity is 2.65 m/s, and with an assumed right atrial pressure of 15 mmHg, the estimated right ventricular systolic pressure is 99991111 mmHg. Left Atrium: Left atrial size was moderately dilated. Right Atrium: Right atrial size was not well visualized. Pericardium: There is no evidence of pericardial effusion. Mitral Valve: The mitral valve is grossly normal. Trivial mitral valve regurgitation. Tricuspid Valve: The tricuspid valve is grossly normal. Tricuspid valve regurgitation is mild. Aortic Valve: The aortic valve is calcified. Aortic valve regurgitation is not visualized. Mild aortic stenosis is present. Aortic valve mean gradient measures 6.5 mmHg. Aortic valve peak gradient measures 12.7 mmHg. Aortic valve area, by VTI measures 1.32 cm. Pulmonic Valve: The pulmonic valve was not well visualized. Pulmonic valve regurgitation is not visualized. Aorta: The aortic root is normal in size and structure. Venous: The inferior vena cava is dilated in size with less than 50% respiratory variability,  suggesting right atrial pressure of 15 mmHg. IAS/Shunts: No atrial level shunt detected by color flow Doppler. LEFT VENTRICLE PLAX 2D                        Biplane EF (MOD) LVOT diam:     1.80  cm         LV Biplane EF:   Left LV SV:         48                               ventricular LV SV Index:   20                               ejection LVOT Area:     2.54 cm                         fraction by                                                 2D MOD                                                 biplane is LV Volumes (MOD)                                42.7 %. LV vol d, MOD    161.0 ml A2C: LV vol d, MOD    194.0 ml A4C: LV vol s, MOD    92.8 ml A2C: LV vol s, MOD    113.0 ml A4C: LV SV MOD A2C:   68.2 ml LV SV MOD A4C:   194.0 ml LV SV MOD BP:    77.9 ml AORTIC VALVE AV Area (Vmax):    1.58 cm AV Area (Vmean):   1.63 cm AV Area (VTI):     1.32 cm AV Vmax:           178.50 cm/s AV Vmean:          119.000 cm/s AV VTI:            0.366 m AV Peak Grad:      12.7 mmHg AV Mean Grad:      6.5 mmHg LVOT Vmax:         111.00 cm/s LVOT Vmean:        76.400 cm/s LVOT VTI:          0.190 m LVOT/AV VTI ratio: 0.52  AORTA Ao Root diam: 2.80 cm TRICUSPID VALVE TR Peak grad:   28.1 mmHg TR Vmax:        265.00 cm/s  SHUNTS Systemic VTI:  0.19 m Systemic Diam: 1.80 cm Adrian Prows MD Electronically signed by Adrian Prows MD Signature Date/Time: 10/09/2020/1:14:46 PM    Final     Scheduled Meds: . vitamin C  500 mg Oral Daily  . atorvastatin  10 mg Oral Daily  . baricitinib  2 mg Oral Daily  . buPROPion  150 mg Oral Daily  . cholecalciferol  1,000 Units Oral Daily  . furosemide  40 mg Intravenous Daily  . insulin aspart  0-15 Units Subcutaneous TID WC  . Ipratropium-Albuterol  1 puff Inhalation TID  . metoprolol succinate  50 mg Oral TID  . multivitamin with minerals  1 tablet Oral Daily  . [START ON 10/10/2020] predniSONE  50 mg Oral Daily  . rivaroxaban  20 mg Oral Q supper  . spironolactone  25 mg Oral Daily  . venlafaxine  75 mg Oral Daily  . zinc sulfate  220 mg Oral Daily    Continuous Infusions: .  ceFAZolin (ANCEF) IV 2 g (10/09/20 1349)  . methylPREDNISolone (SOLU-MEDROL) injection 150 mg  (10/09/20 1154)  . remdesivir 100 mg in NS 100 mL 100 mg (10/09/20 0903)     LOS: 2 days     Kayleen Memos, MD [facility] Pager [phone]  If 7PM-7AM, please contact night-coverage [email or web address] Password [facility] 10/09/2020, 3:03 PM

## 2020-10-10 LAB — CBC WITH DIFFERENTIAL/PLATELET
Abs Immature Granulocytes: 0.05 10*3/uL (ref 0.00–0.07)
Basophils Absolute: 0 10*3/uL (ref 0.0–0.1)
Basophils Relative: 0 %
Eosinophils Absolute: 0 10*3/uL (ref 0.0–0.5)
Eosinophils Relative: 0 %
HCT: 38.5 % (ref 36.0–46.0)
Hemoglobin: 11.5 g/dL — ABNORMAL LOW (ref 12.0–15.0)
Immature Granulocytes: 1 %
Lymphocytes Relative: 5 %
Lymphs Abs: 0.5 10*3/uL — ABNORMAL LOW (ref 0.7–4.0)
MCH: 25.4 pg — ABNORMAL LOW (ref 26.0–34.0)
MCHC: 29.9 g/dL — ABNORMAL LOW (ref 30.0–36.0)
MCV: 85.2 fL (ref 80.0–100.0)
Monocytes Absolute: 0.2 10*3/uL (ref 0.1–1.0)
Monocytes Relative: 2 %
Neutro Abs: 8.6 10*3/uL — ABNORMAL HIGH (ref 1.7–7.7)
Neutrophils Relative %: 92 %
Platelets: 334 10*3/uL (ref 150–400)
RBC: 4.52 MIL/uL (ref 3.87–5.11)
RDW: 19.2 % — ABNORMAL HIGH (ref 11.5–15.5)
WBC: 9.3 10*3/uL (ref 4.0–10.5)
nRBC: 0.2 % (ref 0.0–0.2)

## 2020-10-10 LAB — COMPREHENSIVE METABOLIC PANEL
ALT: 11 U/L (ref 0–44)
AST: 15 U/L (ref 15–41)
Albumin: 3.2 g/dL — ABNORMAL LOW (ref 3.5–5.0)
Alkaline Phosphatase: 60 U/L (ref 38–126)
Anion gap: 11 (ref 5–15)
BUN: 40 mg/dL — ABNORMAL HIGH (ref 8–23)
CO2: 28 mmol/L (ref 22–32)
Calcium: 8.6 mg/dL — ABNORMAL LOW (ref 8.9–10.3)
Chloride: 94 mmol/L — ABNORMAL LOW (ref 98–111)
Creatinine, Ser: 1.26 mg/dL — ABNORMAL HIGH (ref 0.44–1.00)
GFR, Estimated: 46 mL/min — ABNORMAL LOW (ref >60)
Glucose, Bld: 168 mg/dL — ABNORMAL HIGH (ref 70–99)
Potassium: 4.3 mmol/L (ref 3.5–5.1)
Sodium: 133 mmol/L — ABNORMAL LOW (ref 135–145)
Total Bilirubin: 0.6 mg/dL (ref 0.3–1.2)
Total Protein: 6.2 g/dL — ABNORMAL LOW (ref 6.5–8.1)

## 2020-10-10 LAB — MAGNESIUM: Magnesium: 2.4 mg/dL (ref 1.7–2.4)

## 2020-10-10 LAB — C-REACTIVE PROTEIN: CRP: 1.3 mg/dL — ABNORMAL HIGH (ref <1.0)

## 2020-10-10 LAB — PHOSPHORUS: Phosphorus: 2.5 mg/dL (ref 2.5–4.6)

## 2020-10-10 LAB — FERRITIN: Ferritin: 23 ng/mL (ref 11–307)

## 2020-10-10 LAB — GLUCOSE, CAPILLARY
Glucose-Capillary: 146 mg/dL — ABNORMAL HIGH (ref 70–99)
Glucose-Capillary: 213 mg/dL — ABNORMAL HIGH (ref 70–99)
Glucose-Capillary: 231 mg/dL — ABNORMAL HIGH (ref 70–99)
Glucose-Capillary: 169 mg/dL — ABNORMAL HIGH (ref 70–99)
Glucose-Capillary: 195 mg/dL — ABNORMAL HIGH (ref 70–99)

## 2020-10-10 LAB — D-DIMER, QUANTITATIVE: D-Dimer, Quant: 0.61 ug/mL-FEU — ABNORMAL HIGH (ref 0.00–0.50)

## 2020-10-10 MED ORDER — TRAZODONE HCL 50 MG PO TABS
50.0000 mg | ORAL_TABLET | Freq: Every evening | ORAL | Status: DC | PRN
  Administered 2020-10-10: 50 mg via ORAL
  Filled 2020-10-10: qty 1

## 2020-10-10 NOTE — Progress Notes (Signed)
PROGRESS NOTE  Lorraine Harris HMC:[id number] DOB: March 01, 1951 DOA: 10/06/2020 PCP: Everardo Beals, NP  HPI/Recap of past 24 hours: Lorraine Bolognese Richardsonis a 70 y.o.femalewithhistory of chronic systolic heart failure last EF 35 to 40%, paroxysmal atrial fibrillation on Xarelto, hypertension, chronic kidney disease stage II, hyperlipidemia, chronic depression, morbid obesity who presented to [facility] ED with worsening bilateral lower extremity edema of 1 week duration, associated with left lower extremity erythema and draining lesion for which wound care specialist was consulted.  She was started on IV antibiotics empirically in the ED.  She has chronic shortness of breath.  COVID-19 screening test positive on 10/07/2020  Work-up in the ED revealed left lower extremity cellulitis, acute hypoxic respiratory failure,  A. fib with RVR, COVID-19 viral pneumonia, and acute on chronic systolic CHF.  Seen by cardiology.  Started on COVID-19 directed therapies with the patient's own consent.  10/10/20:   Seen and examined at bedside.  States she felt anxious last night and could not sleep.  Added trazodone nightly as needed for situational insomnia.  Assessment/Plan: Principal Problem:   Atrial fibrillation with RVR (HCC) Active Problems:   Morbid obesity ([place])   Essential hypertension   Acute on chronic systolic CHF (congestive heart failure) (HCC)   Cellulitis of left leg   Atrial fibrillation with rapid ventricular response (HCC)  Left lower extremity cellulitis, POA MRSA screen negative Started on cefazolin day #4, continue Blood cultures negative to date. Currently afebrile with no leukocytosis.  COVID-19 viral pneumonia, poa COVID-19 screening test positive on 10/07/2020 Evidence of atypical pneumonia on CT chest. Inflammatory markers are downtrending. Continue to trend inflammatory markers  Improving acute hypoxic respiratory failure secondary to COVID-19 viral pneumonia acute on chronic  systolic CHF. BNP 611, bilateral lower extremity edema Initially requiring 4 to 6 L, now down to 2 L with O2 saturation in the mid 90s. Continue diuretics IV Lasix 40 mg daily Continue COVID-19 directed therapies Remdesivir day #4/5 Baricitinib day #4 Obtain home O2 evaluation prior to DC.  Persistent A. fib with RVR Initially started on Cardizem drip, stopped on 10/08/2020. Continue Toprol-XL Continue Xarelto for CVA prevention. Continue to monitor on telemetry.  Acute on chronic systolic CHF Last 2D echo 04/22/2019 LVEF 35 to 40% Continue regimen as recommended by cardiology Continue strict I's and O's and daily weight Net I&O -335 cc  Severe morbid obesity BMI 56 Recommend weight loss outpatient with regular physical activity and healthy dieting.  CKD 3B Back to her baseline creatinine 1.2 with GFR 46. Continue to avoid nephrotoxins and monitor urine output  Ambulatory dysfunction PT recommended home health PT TOC consulted to assist with [facility] needs. Continue fall precautions  Situational insomnia Plan trazodone nightly as needed    Code Status: Full code.  Family Communication: None at bedside.  Disposition Plan: Likely discharge to home with home health services.   Consultants:  Cardiology  Procedures:  None.  Antimicrobials:  Cefazolin, started on 10/07/20.  DVT prophylaxis: Xarelto.  Status is: Inpatient    Dispo:  Patient From: Home  Planned Disposition: Home with Health Care Svc  Expected discharge date: 10/12/2020  Medically stable for discharge: No, ongoing management of left lower extremity cellulitis, A. fib with RVR, acute hypoxia, acute on chronic systolic CHF.          Objective: Vitals:   10/09/20 1945 10/09/20 2101 10/09/20 2228 10/10/20 1545  BP:  93/65 91/61 102/65  Pulse:  98 72 77  Resp:  17 18 15  Temp:  97.8 F (36.6 C)  (!) 97.3 F (36.3 C)  TempSrc:    Oral  SpO2: 95% 94% 96% 91%  Weight:      Height:         Intake/Output Summary (Last 24 hours) at 10/10/2020 1739 Last data filed at 10/10/2020 1205 Gross per 24 hour  Intake 460 ml  Output 1750 ml  Net -1290 ml   Filed Weights   10/06/20 2007 10/09/20 0400  Weight: (!) 149.7 kg (!) 145.8 kg    Exam:  . General: 70 y.o. year-old female severely morbidly obese.  In no acute distress.  Alert and oriented x3.   . Cardiovascular: Irregular rate and rhythm no rubs or gallops. Marland Kitchen Respiratory: Clear to auscultation no wheezes or rales.  Good inspiratory effort. . Abdomen: Obese nontender bowel sounds present..   . Musculoskeletal: Improved bilateral lower extremity edema.  Left lower extremity is wrapped with dressing. Marland Kitchen Psychiatry: Mood is appropriate for condition and setting.   Data Reviewed: CBC: Recent Labs  Lab 10/06/20 2054 10/07/20 0406 10/08/20 0445 10/09/20 0337 10/10/20 0424  WBC 8.4 7.9 4.8 8.8 9.3  NEUTROABS 5.6  --  4.3 7.9* 8.6*  HGB 13.3 12.3 12.2 11.8* 11.5*  HCT 43.3 40.1 40.2 38.1 38.5  MCV 83.3 84.6 84.8 83.4 85.2  PLT 445* 389 392 295 488   Basic Metabolic Panel: Recent Labs  Lab 10/06/20 2054 10/07/20 0132 10/07/20 0406 10/08/20 0445 10/09/20 0337 10/10/20 0424  NA 139  --  134* 135 136 133*  K 4.2  --  3.7 3.6 3.7 4.3  CL 99  --  100 95* 94* 94*  CO2 26  --  26 28 28 28   GLUCOSE 115*  --  116* 151* 165* 168*  BUN 29*  --  25* 26* 30* 40*  CREATININE 1.39*  --  1.41* 1.30* 1.25* 1.26*  CALCIUM 9.4  --  8.1* 8.7* 8.6* 8.6*  MG 1.8 1.4*  --  2.4 2.4 2.4  PHOS  --   --   --  3.7 3.2 2.5   GFR: Estimated Creatinine Clearance: 59.7 mL/min (A) (by C-G formula based on SCr of 1.26 mg/dL (H)). Liver Function Tests: Recent Labs  Lab 10/06/20 2054 10/08/20 0445 10/09/20 0337 10/10/20 0424  AST 15 17 14* 15  ALT 15 14 11 11   ALKPHOS 58 62 54 60  BILITOT 0.3 0.5 0.5 0.6  PROT 7.2 6.9 6.0* 6.2*  ALBUMIN 3.5 3.3* 3.1* 3.2*   No results for input(s): LIPASE, AMYLASE in the last 168 hours. No  results for input(s): AMMONIA in the last 168 hours. Coagulation Profile: No results for input(s): INR, PROTIME in the last 168 hours. Cardiac Enzymes: No results for input(s): CKTOTAL, CKMB, CKMBINDEX, TROPONINI in the last 168 hours. BNP (last 3 results) No results for input(s): PROBNP in the last 8760 hours. HbA1C: No results for input(s): HGBA1C in the last 72 hours. CBG: Recent Labs  Lab 10/09/20 1722 10/09/20 2050 10/10/20 0754 10/10/20 1139 10/10/20 1716  GLUCAP 140* 184* 213* 231* 169*   Lipid Profile: No results for input(s): CHOL, HDL, LDLCALC, TRIG, CHOLHDL, LDLDIRECT in the last 72 hours. Thyroid Function Tests: No results for input(s): TSH, T4TOTAL, FREET4, T3FREE, THYROIDAB in the last 72 hours. Anemia Panel: Recent Labs    10/09/20 0337 10/10/20 0424  FERRITIN 23 23   Urine analysis:    Component Value Date/Time   COLORURINE YELLOW 06/19/2013 0304   APPEARANCEUR CLEAR 06/19/2013 0304  LABSPEC 1.032 (H) 06/19/2013 0304   PHURINE 5.0 06/19/2013 0304   GLUCOSEU NEGATIVE 06/19/2013 0304   HGBUR NEGATIVE 06/19/2013 0304   BILIRUBINUR NEGATIVE 06/19/2013 0304   KETONESUR NEGATIVE 06/19/2013 0304   PROTEINUR NEGATIVE 06/19/2013 0304   UROBILINOGEN 0.2 06/19/2013 0304   NITRITE NEGATIVE 06/19/2013 0304   LEUKOCYTESUR NEGATIVE 06/19/2013 0304   Sepsis Labs: @LABRCNTIP (procalcitonin:4,lacticidven:4)  ) Recent Results (from the past 240 hour(s))  SARS CORONAVIRUS 2 (TAT 6-24 HRS) Nasopharyngeal Nasopharyngeal Swab     Status: Abnormal   Collection Time: 10/07/20 12:00 AM   Specimen: Nasopharyngeal Swab  Result Value Ref Range Status   SARS Coronavirus 2 POSITIVE (A) NEGATIVE Final    Comment: (NOTE) SARS-CoV-2 target nucleic acids are DETECTED.  The SARS-CoV-2 RNA is generally detectable in upper and lower respiratory specimens during the acute phase of infection. Positive results are indicative of the presence of SARS-CoV-2 RNA. Clinical correlation  with patient history and other diagnostic information is  necessary to determine patient infection status. Positive results do not rule out bacterial infection or co-infection with other viruses.  The expected result is Negative.  Fact Sheet for Patients: SugarRoll.be  Fact Sheet for Healthcare Providers: https://www.woods-mathews.com/  This test is not yet approved or cleared by the [place] FDA and  has been authorized for detection and/or diagnosis of SARS-CoV-2 by FDA under an Emergency Use Authorization (EUA). This EUA will remain  in effect (meaning this test can be used) for the duration of the COVID-19 declaration under Section 564(b)(1) of the Act, 21 U. S.C. section 360bbb-3(b)(1), unless the authorization is terminated or revoked sooner.   Performed at [facility], [place] [address] [place], [place] [address]   Culture, blood (routine x 2)     Status: None (Preliminary result)   Collection Time: 10/07/20  1:32 AM   Specimen: BLOOD  Result Value Ref Range Status   Specimen Description   Final    BLOOD LEFT ANTECUBITAL Performed at [facility] [address], [place], [place] [address]    Special Requests   Final    BOTTLES DRAWN AEROBIC ONLY Blood Culture results may not be optimal due to an inadequate volume of blood received in culture bottles Performed at [facility] [address], [place], [place] [address]    Culture   Final    NO GROWTH 3 DAYS Performed at [facility], [place] [address] [place], [place] [address]    Report Status PENDING  Incomplete  Culture, blood (routine x 2)     Status: None (Preliminary result)   Collection Time: 10/07/20  1:33 AM   Specimen: BLOOD  Result Value Ref Range Status   Specimen Description   Final    BLOOD RIGHT WRIST Performed at [facility] [address] [place], [place] [address]    Special  Requests   Final    BOTTLES DRAWN AEROBIC AND ANAEROBIC Blood Culture results may not be optimal due to an inadequate volume of blood received in culture bottles Performed at [facility] [address], [place], [place] [address]    Culture   Final    NO GROWTH 3 DAYS Performed at [facility], [place] [address] [place], [place] [address]    Report Status PENDING  Incomplete  MRSA PCR Screening     Status: None   Collection Time: 10/09/20  5:30 PM   Specimen: Nasopharyngeal  Result Value Ref  Range Status   MRSA by PCR NEGATIVE NEGATIVE Final    Comment:        The GeneXpert MRSA Assay (FDA approved for NASAL specimens only), is one component of a comprehensive MRSA colonization surveillance program. It is not intended to diagnose MRSA infection nor to guide or monitor treatment for MRSA infections. Performed at [facility], [place] [address], [place], [place] [address]       Studies: No results found.  Scheduled Meds: . vitamin C  500 mg Oral Daily  . atorvastatin  10 mg Oral Daily  . baricitinib  2 mg Oral Daily  . buPROPion  150 mg Oral Daily  . cholecalciferol  1,000 Units Oral Daily  . furosemide  40 mg Intravenous Daily  . insulin aspart  0-15 Units Subcutaneous TID WC  . Ipratropium-Albuterol  1 puff Inhalation TID  . metoprolol succinate  50 mg Oral TID  . multivitamin with minerals  1 tablet Oral Daily  . predniSONE  50 mg Oral Daily  . rivaroxaban  20 mg Oral Q supper  . spironolactone  25 mg Oral Daily  . venlafaxine  75 mg Oral Daily  . zinc sulfate  220 mg Oral Daily    Continuous Infusions: .  ceFAZolin (ANCEF) IV 2 g (10/10/20 1315)  . remdesivir 100 mg in NS 100 mL 100 mg (10/10/20 0836)     LOS: 3 days     Kayleen Memos, MD [facility] Pager [phone]  If 7PM-7AM, please contact night-coverage [email or web address] Password [email or web address] 10/10/2020, 5:39 PM

## 2020-10-10 NOTE — Care Management Important Message (Signed)
Important Message  Patient Details IM Letter placed in Patient's door Caddy. Name: Lorraine Harris MRN: [id number] Date of Birth: Apr 18, 1951   Medicare Important Message Given:  Yes     Kerin Salen 10/10/2020, 10:10 AM

## 2020-10-10 NOTE — Plan of Care (Signed)
  Problem: Activity: Goal: Risk for activity intolerance will decrease Outcome: Progressing   Problem: Coping: Goal: Level of anxiety will decrease Outcome: Progressing   Problem: Pain Managment: Goal: General experience of comfort will improve Outcome: Progressing   Problem: Safety: Goal: Ability to remain free from injury will improve Outcome: Progressing   Problem: Skin Integrity: Goal: Risk for impaired skin integrity will decrease Outcome: Progressing   

## 2020-10-10 NOTE — Plan of Care (Signed)
  Problem: Clinical Measurements: Goal: Ability to maintain clinical measurements within normal limits will improve Outcome: Progressing Goal: Will remain free from infection Outcome: Progressing Goal: Respiratory complications will improve Outcome: Progressing Goal: Cardiovascular complication will be avoided Outcome: Progressing   Problem: Activity: Goal: Risk for activity intolerance will decrease Outcome: Progressing   Problem: Coping: Goal: Level of anxiety will decrease Outcome: Progressing   Problem: Pain Managment: Goal: General experience of comfort will improve Outcome: Progressing   Problem: Safety: Goal: Ability to remain free from injury will improve Outcome: Progressing   Problem: Skin Integrity: Goal: Risk for impaired skin integrity will decrease Outcome: Progressing

## 2020-10-10 NOTE — Progress Notes (Signed)
Physical Therapy Treatment Patient Details Name: Lorraine Harris MRN: [id number] DOB: 05/05/1951 Today's Date: 10/10/2020    History of Present Illness 70 yo female patient admitted for A. fib with RVR with CHF and cellulitis of the lower extremities. PMH: CHF, HTN, morbid obesity    PT Comments    Pt pleasant and very agreeable to participate.  Pt already in recliner on arrival.  Pt ambulated in room 28 feet x2 with seated rest break.   Spo2 on room air 93% and then 95% after each ambulation however HR in afib and flucuating 120s-150s with activity.    Follow Up Recommendations  Home health PT     Equipment Recommendations  None recommended by PT    Recommendations for Other Services       Precautions / Restrictions Precautions Precautions: Fall Precaution Comments: monitor HR    Mobility  Bed Mobility               General bed mobility comments: pt in recliner on arrival  Transfers Overall transfer level: Needs assistance Equipment used: Rolling walker (2 wheeled) Transfers: Sit to/from Stand Sit to Stand: Min guard         General transfer comment: pt utilizes armrests to self assist  Ambulation/Gait Ambulation/Gait assistance: Min guard;Min assist Gait Distance (Feet): 28 Feet (x2) Assistive device: Rolling walker (2 wheeled) Gait Pattern/deviations: Step-through pattern;Decreased stride length;Wide base of support     General Gait Details: 28 feet x2 with seated rest break; Spo2 on room air 93% and then 95% after each ambulation however HR in afib and flucuating 120s-150s with activity   Stairs             Wheelchair Mobility    Modified Rankin (Stroke Patients Only)       Balance                                            Cognition Arousal/Alertness: Awake/alert Behavior During Therapy: WFL for tasks assessed/performed Overall Cognitive Status: Within Functional Limits for tasks assessed                                         Exercises      General Comments        Pertinent Vitals/Pain Pain Assessment: No/denies pain    Home Living                      Prior Function            PT Goals (current goals can now be found in the care plan section) Progress towards PT goals: Progressing toward goals    Frequency    Min 3X/week      PT Plan Current plan remains appropriate    Co-evaluation              AM-PAC PT "6 Clicks" Mobility   Outcome Measure  Help needed turning from your back to your side while in a flat bed without using bedrails?: A Little Help needed moving from lying on your back to sitting on the side of a flat bed without using bedrails?: A Little Help needed moving to and from a bed to a chair (including a wheelchair)?: A Little Help needed standing up from  a chair using your arms (e.g., wheelchair or bedside chair)?: A Little Help needed to walk in hospital room?: A Little Help needed climbing 3-5 steps with a railing? : A Lot 6 Click Score: 17    End of Session   Activity Tolerance: Patient tolerated treatment well Patient left: in chair;with call bell/phone within reach Nurse Communication: Mobility status PT Visit Diagnosis: Other abnormalities of gait and mobility (R26.89)     Time: 8882-8003 PT Time Calculation (min) (ACUTE ONLY): 21 min  Charges:  $Gait Training: 8-22 mins                     Arlyce Dice, DPT Acute Rehabilitation Services Pager: [phone] Office: [place] 10/10/2020, 4:17 PM

## 2020-10-11 LAB — CBC WITH DIFFERENTIAL/PLATELET
Abs Immature Granulocytes: 0.05 10*3/uL (ref 0.00–0.07)
Basophils Absolute: 0 10*3/uL (ref 0.0–0.1)
Basophils Relative: 0 %
Eosinophils Absolute: 0 10*3/uL (ref 0.0–0.5)
Eosinophils Relative: 0 %
HCT: 39.4 % (ref 36.0–46.0)
Hemoglobin: 11.8 g/dL — ABNORMAL LOW (ref 12.0–15.0)
Immature Granulocytes: 1 %
Lymphocytes Relative: 6 %
Lymphs Abs: 0.5 10*3/uL — ABNORMAL LOW (ref 0.7–4.0)
MCH: 25.5 pg — ABNORMAL LOW (ref 26.0–34.0)
MCHC: 29.9 g/dL — ABNORMAL LOW (ref 30.0–36.0)
MCV: 85.1 fL (ref 80.0–100.0)
Monocytes Absolute: 0.5 10*3/uL (ref 0.1–1.0)
Monocytes Relative: 7 %
Neutro Abs: 7.1 10*3/uL (ref 1.7–7.7)
Neutrophils Relative %: 86 %
Platelets: 286 10*3/uL (ref 150–400)
RBC: 4.63 MIL/uL (ref 3.87–5.11)
RDW: 19.1 % — ABNORMAL HIGH (ref 11.5–15.5)
WBC: 8.2 10*3/uL (ref 4.0–10.5)
nRBC: 0.2 % (ref 0.0–0.2)

## 2020-10-11 LAB — COMPREHENSIVE METABOLIC PANEL
ALT: 18 U/L (ref 0–44)
AST: 31 U/L (ref 15–41)
Albumin: 3.3 g/dL — ABNORMAL LOW (ref 3.5–5.0)
Alkaline Phosphatase: 81 U/L (ref 38–126)
Anion gap: 9 (ref 5–15)
BUN: 45 mg/dL — ABNORMAL HIGH (ref 8–23)
CO2: 31 mmol/L (ref 22–32)
Calcium: 8.7 mg/dL — ABNORMAL LOW (ref 8.9–10.3)
Chloride: 97 mmol/L — ABNORMAL LOW (ref 98–111)
Creatinine, Ser: 1.18 mg/dL — ABNORMAL HIGH (ref 0.44–1.00)
GFR, Estimated: 50 mL/min — ABNORMAL LOW (ref >60)
Glucose, Bld: 165 mg/dL — ABNORMAL HIGH (ref 70–99)
Potassium: 4.3 mmol/L (ref 3.5–5.1)
Sodium: 137 mmol/L (ref 135–145)
Total Bilirubin: 0.6 mg/dL (ref 0.3–1.2)
Total Protein: 6.3 g/dL — ABNORMAL LOW (ref 6.5–8.1)

## 2020-10-11 LAB — GLUCOSE, CAPILLARY
Glucose-Capillary: 173 mg/dL — ABNORMAL HIGH (ref 70–99)
Glucose-Capillary: 164 mg/dL — ABNORMAL HIGH (ref 70–99)
Glucose-Capillary: 95 mg/dL (ref 70–99)
Glucose-Capillary: 158 mg/dL — ABNORMAL HIGH (ref 70–99)

## 2020-10-11 LAB — MAGNESIUM: Magnesium: 2.5 mg/dL — ABNORMAL HIGH (ref 1.7–2.4)

## 2020-10-11 LAB — PHOSPHORUS: Phosphorus: 1.9 mg/dL — ABNORMAL LOW (ref 2.5–4.6)

## 2020-10-11 LAB — D-DIMER, QUANTITATIVE: D-Dimer, Quant: 0.79 ug/mL-FEU — ABNORMAL HIGH (ref 0.00–0.50)

## 2020-10-11 LAB — FERRITIN: Ferritin: 24 ng/mL (ref 11–307)

## 2020-10-11 LAB — C-REACTIVE PROTEIN: CRP: 0.9 mg/dL (ref <1.0)

## 2020-10-11 MED ORDER — METOPROLOL TARTRATE 50 MG PO TABS
75.0000 mg | ORAL_TABLET | Freq: Three times a day (TID) | ORAL | Status: DC
  Administered 2020-10-11 – 2020-10-13 (×5): 75 mg via ORAL
  Filled 2020-10-11 (×5): qty 1

## 2020-10-11 MED ORDER — METOPROLOL TARTRATE 25 MG PO TABS
25.0000 mg | ORAL_TABLET | Freq: Once | ORAL | Status: AC
  Administered 2020-10-11: 25 mg via ORAL
  Filled 2020-10-11: qty 1

## 2020-10-11 MED ORDER — DIGOXIN 0.25 MG/ML IJ SOLN
0.2500 mg | Freq: Four times a day (QID) | INTRAMUSCULAR | Status: AC
  Administered 2020-10-11 – 2020-10-12 (×2): 0.25 mg via INTRAVENOUS
  Filled 2020-10-11 (×2): qty 2

## 2020-10-11 MED ORDER — METOPROLOL TARTRATE 50 MG PO TABS
50.0000 mg | ORAL_TABLET | Freq: Once | ORAL | Status: AC
  Administered 2020-10-11: 50 mg via ORAL
  Filled 2020-10-11: qty 1

## 2020-10-11 MED ORDER — METFORMIN HCL 500 MG PO TABS
500.0000 mg | ORAL_TABLET | Freq: Two times a day (BID) | ORAL | Status: DC
  Administered 2020-10-11 – 2020-10-13 (×4): 500 mg via ORAL
  Filled 2020-10-11 (×4): qty 1

## 2020-10-11 MED ORDER — MAGNESIUM OXIDE 400 (241.3 MG) MG PO TABS
400.0000 mg | ORAL_TABLET | Freq: Two times a day (BID) | ORAL | Status: DC
  Administered 2020-10-11 (×2): 400 mg via ORAL
  Filled 2020-10-11 (×2): qty 1

## 2020-10-11 MED ORDER — METOPROLOL SUCCINATE ER 50 MG PO TB24: 75.0000 mg | ORAL_TABLET | Freq: Three times a day (TID) | ORAL | Status: DC

## 2020-10-11 MED ORDER — CEPHALEXIN 500 MG PO CAPS
500.0000 mg | ORAL_CAPSULE | Freq: Two times a day (BID) | ORAL | Status: DC
  Administered 2020-10-11 – 2020-10-13 (×5): 500 mg via ORAL
  Filled 2020-10-11 (×5): qty 1

## 2020-10-11 MED ORDER — DIGOXIN 125 MCG PO TABS
0.1250 mg | ORAL_TABLET | Freq: Every day | ORAL | Status: DC
  Administered 2020-10-12 – 2020-10-13 (×2): 0.125 mg via ORAL
  Filled 2020-10-11 (×2): qty 1

## 2020-10-11 NOTE — Progress Notes (Signed)
PROGRESS NOTE  Lorraine Harris DJM:[id number] DOB: 05-26-1951 DOA: 10/06/2020 PCP: Everardo Beals, NP  HPI/Recap of past 24 hours:  70 y.o. chronic systolic heart failure last EF 35 to 40%, paroxysmal atrial fibrillation CHADS2 score >3 on Xarelto  hypertension, chronic kidney disease stage II, hyperlipidemia, depression, morbid obesity BMI >55  has been experiencing increasing bilateral lower extremity edema over the last 1 week and particularly on the left lower extremity patient also noticed some discharge and erythema.  She has chronic shortness of breath and denies any chest pain.   Went to urgent care and referred to ED because of rapid heart rate  Work-up in the ED revealed left lower extremity cellulitis, acute hypoxic respiratory failure, A. fib with RVR, COVID-19 viral pneumonia, and acute on chronic systolic CHF.  CT chest was negative for PE    Assessment/Plan: Principal Problem:   Atrial fibrillation with RVR (HCC) Active Problems:   Morbid obesity ([place])   Essential hypertension   Acute on chronic systolic CHF (congestive heart failure) ([place])   Cellulitis of left leg   Atrial fibrillation with rapid ventricular response (HCC)  Left lower extremity cellulitis, POA Started on antibiotics 2/4--narrow to p.o. Keflex to complete 14 days total  COVID-19 viral pneumonia, poa COVID-19 screening test positive on 10/07/2020 Evidence of atypical pneumonia on CT chest. All biomarkers have resolved and was transitioned to p.o. steroids 2/8--will give a total of 10 days of steroids ending 2/13  Improving acute hypoxic respiratory failure secondary to COVID-19 viral pneumonia acute on chronic systolic CHF. BNP 611, bilateral lower extremity edema Initially requiring 4 to 6 L, now down to 2 L with O2 saturation in the mid 90s and not requiring oxygen currently. Continue diuretics IV Lasix 40 mg daily-requires transition to p.o. Lasix in the next 24 hours with negative fluid  balance Completed 5 days of remdesivir 2/9-can discontinue baricitinib  A. fib with RVR Initially started on Cardizem drip, stopped on 10/08/2020. Continue Toprol-XL 50 mg as per cardiology-in addition I am adding 25 mg short acting now as has been tachycardic for the past 12 hours If still tachycardic may add Cardizem versus uptitrate Toprol-XL Continue Aldactone 25 daily Continue Xarelto for CVA prevention.  Acute on chronic systolic CHF Last 2D echo 04/22/2019 LVEF 35 to 40% Continue regimen as recommended by cardiology Net weights relatively even  Anxiety Continue Xanax 0.5 3 times daily as needed, Wellbutrin 150 daily, trazodone 50 at bedtime, Effexor 75 daily  DM TY 2 Continue sliding scale coverage blood sugars ranging 150s to 200 eating 100% of meals likely worsened secondary to steroids prednisone 50 Not on prior to admission glycemic control will add Metformin 500 twice daily  Severe morbid obesity BMI 56 Recommend weight loss outpatient with regular physical activity and healthy dieting.  CKD 3B Back to her baseline creatinine 1.2 with GFR 46. Continue to avoid nephrotoxins and monitor urine output   Code Status: Full code.  Family Communication: None at bedside.  Disposition Plan: Likely discharge to home with home health services.   Consultants:  Cardiology  Procedures:  None.  Antimicrobials:  Cefazolin, started on 10/07/20.  DVT prophylaxis: Xarelto.  Status is: Inpatient    Dispo:  Patient From: Home  Planned Disposition: Home with Health Care Svc  Expected discharge date: 10/13/2020  Medically stable for discharge: No, ongoing management of left lower extremity cellulitis, A. fib with RVR, acute hypoxia, acute on chronic systolic CHF.       Subjective Awake  coherent no distress tolerating diet ambulatory in room but somewhat unsteady tells me had 3 falls last week prior to admission no chest pain no fever Nursing alerted me to tachycardia  earlier today-states she is passing good urine today   Objective: Vitals:   10/10/20 2215 10/11/20 0523 10/11/20 0847 10/11/20 1100  BP: 124/72 110/75 107/88 93/74  Pulse: 83 72 91   Resp: 18 18 20 20   Temp: (!) 97.5 F (36.4 C) 97.6 F (36.4 C) (!) 96.2 F (35.7 C) (!) 96.5 F (35.8 C)  TempSrc: Oral Oral Axillary Axillary  SpO2: 94% 97%    Weight:      Height:        Intake/Output Summary (Last 24 hours) at 10/11/2020 1219 Last data filed at 10/11/2020 5053 Gross per 24 hour  Intake 440 ml  Output 400 ml  Net 40 ml   Filed Weights   10/06/20 2007 10/09/20 0400  Weight: (!) 149.7 kg (!) 145.8 kg    Exam:  Awake coherent obese no distress EOMI NCAT No focal deficit No rales no rhonchi CTA B Abdomen obese nontender Wound examined 2 cm round wounds in lower extremities on the left side Red throughout however swelling is decreased ROM intact  Data Reviewed: CBC: Recent Labs  Lab 10/06/20 2054 10/07/20 0406 10/08/20 0445 10/09/20 0337 10/10/20 0424 10/11/20 0426  WBC 8.4 7.9 4.8 8.8 9.3 8.2  NEUTROABS 5.6  --  4.3 7.9* 8.6* 7.1  HGB 13.3 12.3 12.2 11.8* 11.5* 11.8*  HCT 43.3 40.1 40.2 38.1 38.5 39.4  MCV 83.3 84.6 84.8 83.4 85.2 85.1  PLT 445* 389 392 295 334 976   Basic Metabolic Panel: Recent Labs  Lab 10/07/20 0132 10/07/20 0406 10/08/20 0445 10/09/20 0337 10/10/20 0424 10/11/20 0426  NA  --  134* 135 136 133* 137  K  --  3.7 3.6 3.7 4.3 4.3  CL  --  100 95* 94* 94* 97*  CO2  --  26 28 28 28 31   GLUCOSE  --  116* 151* 165* 168* 165*  BUN  --  25* 26* 30* 40* 45*  CREATININE  --  1.41* 1.30* 1.25* 1.26* 1.18*  CALCIUM  --  8.1* 8.7* 8.6* 8.6* 8.7*  MG 1.4*  --  2.4 2.4 2.4 2.5*  PHOS  --   --  3.7 3.2 2.5 1.9*   GFR: Estimated Creatinine Clearance: 63.8 mL/min (A) (by C-G formula based on SCr of 1.18 mg/dL (H)). Liver Function Tests: Recent Labs  Lab 10/06/20 2054 10/08/20 0445 10/09/20 0337 10/10/20 0424 10/11/20 0426  AST 15 17 14*  15 31  ALT 15 14 11 11 18   ALKPHOS 58 62 54 60 81  BILITOT 0.3 0.5 0.5 0.6 0.6  PROT 7.2 6.9 6.0* 6.2* 6.3*  ALBUMIN 3.5 3.3* 3.1* 3.2* 3.3*   No results for input(s): LIPASE, AMYLASE in the last 168 hours. No results for input(s): AMMONIA in the last 168 hours. Coagulation Profile: No results for input(s): INR, PROTIME in the last 168 hours. Cardiac Enzymes: No results for input(s): CKTOTAL, CKMB, CKMBINDEX, TROPONINI in the last 168 hours. BNP (last 3 results) No results for input(s): PROBNP in the last 8760 hours. HbA1C: No results for input(s): HGBA1C in the last 72 hours. CBG: Recent Labs  Lab 10/10/20 1139 10/10/20 1716 10/10/20 2203 10/11/20 0835 10/11/20 1127  GLUCAP 231* 169* 195* 173* 164*   Lipid Profile: No results for input(s): CHOL, HDL, LDLCALC, TRIG, CHOLHDL, LDLDIRECT in the last  72 hours. Thyroid Function Tests: No results for input(s): TSH, T4TOTAL, FREET4, T3FREE, THYROIDAB in the last 72 hours. Anemia Panel: Recent Labs    10/10/20 0424 10/11/20 0426  FERRITIN 23 24   Urine analysis:    Component Value Date/Time   COLORURINE YELLOW 06/19/2013 0304   APPEARANCEUR CLEAR 06/19/2013 0304   LABSPEC 1.032 (H) 06/19/2013 0304   PHURINE 5.0 06/19/2013 0304   GLUCOSEU NEGATIVE 06/19/2013 0304   HGBUR NEGATIVE 06/19/2013 0304   BILIRUBINUR NEGATIVE 06/19/2013 0304   KETONESUR NEGATIVE 06/19/2013 0304   PROTEINUR NEGATIVE 06/19/2013 0304   UROBILINOGEN 0.2 06/19/2013 0304   NITRITE NEGATIVE 06/19/2013 0304   LEUKOCYTESUR NEGATIVE 06/19/2013 0304   Sepsis Labs: @LABRCNTIP (procalcitonin:4,lacticidven:4)  ) Recent Results (from the past 240 hour(s))  SARS CORONAVIRUS 2 (TAT 6-24 HRS) Nasopharyngeal Nasopharyngeal Swab     Status: Abnormal   Collection Time: 10/07/20 12:00 AM   Specimen: Nasopharyngeal Swab  Result Value Ref Range Status   SARS Coronavirus 2 POSITIVE (A) NEGATIVE Final    Comment: (NOTE) SARS-CoV-2 target nucleic acids are  DETECTED.  The SARS-CoV-2 RNA is generally detectable in upper and lower respiratory specimens during the acute phase of infection. Positive results are indicative of the presence of SARS-CoV-2 RNA. Clinical correlation with patient history and other diagnostic information is  necessary to determine patient infection status. Positive results do not rule out bacterial infection or co-infection with other viruses.  The expected result is Negative.  Fact Sheet for Patients: SugarRoll.be  Fact Sheet for Healthcare Providers: https://www.woods-mathews.com/  This test is not yet approved or cleared by the [place] FDA and  has been authorized for detection and/or diagnosis of SARS-CoV-2 by FDA under an Emergency Use Authorization (EUA). This EUA will remain  in effect (meaning this test can be used) for the duration of the COVID-19 declaration under Section 564(b)(1) of the Act, 21 U. S.C. section 360bbb-3(b)(1), unless the authorization is terminated or revoked sooner.   Performed at [facility], [place] [address] [place], [place] [address]   Culture, blood (routine x 2)     Status: None (Preliminary result)   Collection Time: 10/07/20  1:32 AM   Specimen: BLOOD  Result Value Ref Range Status   Specimen Description   Final    BLOOD LEFT ANTECUBITAL Performed at [facility] [address] [place], [place] [address]    Special Requests   Final    BOTTLES DRAWN AEROBIC ONLY Blood Culture results may not be optimal due to an inadequate volume of blood received in culture bottles Performed at [facility] [address] [place], [place] [address]    Culture   Final    NO GROWTH 4 DAYS Performed at [facility], [place] [address], [place], [place] [address]    Report Status PENDING  Incomplete  Culture, blood (routine x 2)     Status: None (Preliminary result)   Collection Time: 10/07/20   1:33 AM   Specimen: BLOOD  Result Value Ref Range Status   Specimen Description   Final    BLOOD RIGHT WRIST Performed at [facility] [address], [place], [place] [address]    Special Requests   Final    BOTTLES DRAWN AEROBIC AND ANAEROBIC Blood Culture results may not be optimal due to an inadequate volume of blood received in culture bottles Performed at [facility] [address], [place], [place] [address]  Culture   Final    NO GROWTH 4 DAYS Performed at [facility], [place] [address] [place], [place] [address]    Report Status PENDING  Incomplete  MRSA PCR Screening     Status: None   Collection Time: 10/09/20  5:30 PM   Specimen: Nasopharyngeal  Result Value Ref Range Status   MRSA by PCR NEGATIVE NEGATIVE Final    Comment:        The GeneXpert MRSA Assay (FDA approved for NASAL specimens only), is one component of a comprehensive MRSA colonization surveillance program. It is not intended to diagnose MRSA infection nor to guide or monitor treatment for MRSA infections. Performed at [facility], [place] [address], [place], [place] [address]       Studies: No results found.  Scheduled Meds: . vitamin C  500 mg Oral Daily  . atorvastatin  10 mg Oral Daily  . baricitinib  2 mg Oral Daily  . buPROPion  150 mg Oral Daily  . cholecalciferol  1,000 Units Oral Daily  . furosemide  40 mg Intravenous Daily  . insulin aspart  0-15 Units Subcutaneous TID WC  . Ipratropium-Albuterol  1 puff Inhalation TID  . metoprolol succinate  50 mg Oral TID  . multivitamin with minerals  1 tablet Oral Daily  . predniSONE  50 mg Oral Daily  . rivaroxaban  20 mg Oral Q supper  . spironolactone  25 mg Oral Daily  . venlafaxine  75 mg Oral Daily  . zinc sulfate  220 mg Oral Daily    Continuous Infusions: .  ceFAZolin (ANCEF) IV 2 g (10/11/20 0520)     LOS: 4 days     Nita Sells, MD [facility] Pager [phone]  If 7PM-7AM, please contact night-coverage [email or web address] Password [email or web address] 10/11/2020, 12:19 PM

## 2020-10-11 NOTE — TOC Initial Note (Signed)
Transition of Care [facility]) - Initial/Assessment Note    Patient Details  Name: Lorraine Harris MRN: [id number] Date of Birth: 1951/07/08  [facility] CM/SW Contact:    Ross Ludwig, LCSW Phone Number: 10/11/2020, 6:21 PM  Clinical Narrative:                  Patient is a 70 year old female who is alert and oriented x4.  Patient lives alone in a house, patient's son is nearby, but does not visit her regularly.  PT saw patient and is recommending [facility], CSW attempted to contact patient to discuss home health agencies, but she did not answer her phone.  CSW spoke to patient's son, he stated she has not had home health before, and he did not have a preference, but he wanted to talk to her to see if she did.  If son speaks to patient before CSW, he will contact CSW about choice of agency.  CSW to continue to follow patient's progress throughout discharge planning.  Expected Discharge Plan: [place] Barriers to Discharge: Continued Medical Work up   Patient Goals and CMS Choice Patient states their goals for this hospitalization and ongoing recovery are:: To return back home with home health. CMS Medicare.gov Compare Post Acute Care list provided to:: Patient Represenative (must comment) Choice offered to / list presented to : Adult Children  Expected Discharge Plan and Services Expected Discharge Plan: [place] Acute Care Choice: [place] arrangements for the past 2 months: Single Family Home                                      Prior Living Arrangements/Services Living arrangements for the past 2 months: Single Family Home Lives with:: Self Patient language and need for interpreter reviewed:: Yes Do you feel safe going back to the place where you live?: Yes      Need for Family Participation in Patient Care: Yes (Comment) Care giver support system in place?: No (comment)   Criminal Activity/Legal  Involvement Pertinent to Current Situation/Hospitalization: No - Comment as needed  Activities of Daily Living Home Assistive Devices/Equipment: Eyeglasses,Walker (specify type) ADL Screening (condition at time of admission) Patient's cognitive ability adequate to safely complete daily activities?: Yes Is the patient deaf or have difficulty hearing?: No Does the patient have difficulty seeing, even when wearing glasses/contacts?: No Does the patient have difficulty concentrating, remembering, or making decisions?: No Patient able to express need for assistance with ADLs?: Yes Does the patient have difficulty dressing or bathing?: Yes Independently performs ADLs?: No Communication: Independent Dressing (OT): Needs assistance Is this a change from baseline?: Pre-admission baseline Grooming: Needs assistance Is this a change from baseline?: Pre-admission baseline Feeding: Independent Bathing: Needs assistance Is this a change from baseline?: Pre-admission baseline Toileting: Needs assistance Is this a change from baseline?: Pre-admission baseline In/Out Bed: Needs assistance Is this a change from baseline?: Pre-admission baseline Walks in Home: Needs assistance Is this a change from baseline?: Pre-admission baseline Does the patient have difficulty walking or climbing stairs?: Yes Weakness of Legs: Both Weakness of Arms/Hands: None  Permission Sought/Granted Permission sought to share information with : Family Supports Permission granted to share information with : Yes, Release of Information Signed  Share Information with NAME: Florestine, Carmical [phone]  Permission granted to share info  w AGENCY: Home health agencies        Emotional Assessment Appearance:: Appears stated age   Affect (typically observed): Accepting,Appropriate,Calm Orientation: : Oriented to  Time,Oriented to Place,Oriented to Self,Oriented to Situation Alcohol / Substance Use: Not Applicable Psych  Involvement: No (comment)  Admission diagnosis:  Rapid atrial fibrillation (HCC) [I48.91] Atrial fibrillation with rapid ventricular response (HCC) [I48.91] Atrial fibrillation with RVR ([place]) [I48.91] Patient Active Problem List   Diagnosis Date Noted  . Atrial fibrillation with RVR ([place]) 10/07/2020  . Acute on chronic systolic CHF (congestive heart failure) ([place]) 10/07/2020  . Cellulitis of left leg 10/07/2020  . Atrial fibrillation with rapid ventricular response ([place]) 10/07/2020  . Contusion of left knee 10/19/2019  . Chronic combined systolic and diastolic heart failure ([place]) 12/17/2018  . Mixed hyperlipidemia 12/17/2018  . Dilated cardiomyopathy ([place]) 09/20/2014  . LBBB (left bundle branch block) 09/20/2014  . AORTIC VALVE DISORDERS 03/21/2008  . OTITIS EXTERNA, ACUTE, LEFT 03/14/2008  . COLONIC POLYPS, BENIGN 07/09/2007  . Morbid obesity ([place]) 07/09/2007  . DEPRESSION 07/09/2007  . MIGRAINE HEADACHE 07/09/2007  . Essential hypertension 07/09/2007  . ESOPHAGEAL STRICTURE 05/11/2001   PCP:  Everardo Beals, NP Pharmacy:   [id number] - [place], [place] - [address] [place] [address] Phone: [phone] Fax: [phone]  [facility] - [place], [place] [address] Phone: [phone] Fax: [phone]     Social Determinants of Health (SDOH) Interventions    Readmission Risk Interventions No flowsheet data found.

## 2020-10-11 NOTE — Progress Notes (Signed)
OOB to chair. States cannot sleep. Trazadone 50mg  and Xanax 0.5mg  given at HS.

## 2020-10-11 NOTE — Progress Notes (Signed)
Physical Therapy Treatment Patient Details Name: Lorraine Harris MRN: [id number] DOB: 1950/11/03 Today's Date: 10/11/2020    History of Present Illness Pt is 70 yo female patient admitted for A. fib with RVR with CHF and cellulitis of the lower extremities. Pt found to have COVID 19. PMH: CHF, HTN, morbid obesity    PT Comments    Pt making gradual progress but does fatigue easily.  Required min A with turns and cues for RW use (pt typically uses rollator).  Pt with decreased O2 sats on RA with activity and on 2 L with activity, but increased gait distance, quicker recovery, and less DOE on 2L.   Continue to progress as able.   SATURATION QUALIFICATIONS: (This note is used to comply with regulatory documentation for home oxygen)  Patient Saturations on Room Air at Rest = 90%  Patient Saturations on Room Air while Ambulating = 82%  Patient Saturations on 2 Liters of oxygen while Ambulating = 83%  Please briefly explain why patient needs home oxygen:In order to maintain O2 sats with activity.    Follow Up Recommendations  Home health PT     Equipment Recommendations  None recommended by PT    Recommendations for Other Services       Precautions / Restrictions Precautions Precautions: Fall Precaution Comments: monitor HR and O2    Mobility  Bed Mobility               General bed mobility comments: pt in recliner on arrival  Transfers Overall transfer level: Needs assistance Equipment used: Rolling walker (2 wheeled) Transfers: Sit to/from Stand Sit to Stand: Min guard         General transfer comment: Performed x 3 throughout session; pt verbalized safe standing and sitting techniques as she did them; assist for ADLs in standing  Ambulation/Gait Ambulation/Gait assistance: Min assist Gait Distance (Feet): 16 Feet (16' then 32') Assistive device: Rolling walker (2 wheeled) Gait Pattern/deviations: Step-through pattern;Decreased stride length Gait  velocity: decreased   General Gait Details: Pt used to a rollator required cues for RW proximity and to pick up with turns; Min A to stabilize with turns.  See general comments for VS   Stairs             Wheelchair Mobility    Modified Rankin (Stroke Patients Only)       Balance Overall balance assessment: Needs assistance   Sitting balance-Leahy Scale: Good       Standing balance-Leahy Scale: Poor Standing balance comment: reliant on UEs                            Cognition Arousal/Alertness: Awake/alert Behavior During Therapy: WFL for tasks assessed/performed Overall Cognitive Status: Within Functional Limits for tasks assessed                                        Exercises Other Exercises Other Exercises: IS x 5 to 750 mL with cues for correct use    General Comments General comments (skin integrity, edema, etc.): Pt on 2 L O2 at rest with sats 95%; attempted RA and sats 90% rest; On RA dropped to 82% ambulation DOE 3/4 and took 2 mins to recover on 2 L. Then ambulated on 2 L with sats down to 83% but with increased distance, DOE of 2/4, and only 30  sec recovery. HR 115 bpm to 136 bpm.  At arrival, purewick was in floor and pt was wet with urine in chair and on floor.  Assisted with cleaning pt, floor, and replaced chair linens.      Pertinent Vitals/Pain Pain Assessment: Harris/denies pain    Home Living                      Prior Function            PT Goals (current goals can now be found in the care plan section) Acute Rehab PT Goals Patient Stated Goal: home soon for warmer weather this week PT Goal Formulation: With patient Time For Goal Achievement: 10/22/20 Potential to Achieve Goals: Good Progress towards PT goals: Progressing toward goals    Frequency    Min 3X/week      PT Plan Current plan remains appropriate    Co-evaluation              AM-PAC PT "6 Clicks" Mobility   Outcome  Measure  Help needed turning from your back to your side while in a flat bed without using bedrails?: A Little Help needed moving from lying on your back to sitting on the side of a flat bed without using bedrails?: A Little Help needed moving to and from a bed to a chair (including a wheelchair)?: A Little Help needed standing up from a chair using your arms (e.g., wheelchair or bedside chair)?: A Little Help needed to walk in hospital room?: A Little Help needed climbing 3-5 steps with a railing? : A Lot 6 Click Score: 17    End of Session Equipment Utilized During Treatment: Gait belt;Oxygen Activity Tolerance: Patient tolerated treatment well Patient left: in chair;with call bell/phone within reach Nurse Communication: Mobility status PT Visit Diagnosis: Other abnormalities of gait and mobility (R26.89)     Time: 1440-1511 PT Time Calculation (min) (ACUTE ONLY): 31 min  Charges:  $Gait Training: 8-22 mins $Therapeutic Activity: 8-22 mins                     Abran Richard, PT Acute Rehab Services Pager [phone] [facility] 10/11/2020, 3:47 PM

## 2020-10-12 LAB — COMPREHENSIVE METABOLIC PANEL
ALT: 25 U/L (ref 0–44)
AST: 35 U/L (ref 15–41)
Albumin: 3.3 g/dL — ABNORMAL LOW (ref 3.5–5.0)
Alkaline Phosphatase: 65 U/L (ref 38–126)
Anion gap: 9 (ref 5–15)
BUN: 43 mg/dL — ABNORMAL HIGH (ref 8–23)
CO2: 35 mmol/L — ABNORMAL HIGH (ref 22–32)
Calcium: 8.7 mg/dL — ABNORMAL LOW (ref 8.9–10.3)
Chloride: 95 mmol/L — ABNORMAL LOW (ref 98–111)
Creatinine, Ser: 1 mg/dL (ref 0.44–1.00)
GFR, Estimated: >60 mL/min (ref >60)
Glucose, Bld: 99 mg/dL (ref 70–99)
Potassium: 5 mmol/L (ref 3.5–5.1)
Sodium: 139 mmol/L (ref 135–145)
Total Bilirubin: 0.7 mg/dL (ref 0.3–1.2)
Total Protein: 6.3 g/dL — ABNORMAL LOW (ref 6.5–8.1)

## 2020-10-12 LAB — GLUCOSE, CAPILLARY
Glucose-Capillary: 71 mg/dL (ref 70–99)
Glucose-Capillary: 130 mg/dL — ABNORMAL HIGH (ref 70–99)
Glucose-Capillary: 146 mg/dL — ABNORMAL HIGH (ref 70–99)
Glucose-Capillary: 218 mg/dL — ABNORMAL HIGH (ref 70–99)

## 2020-10-12 LAB — CBC WITH DIFFERENTIAL/PLATELET
Abs Immature Granulocytes: 0.03 10*3/uL (ref 0.00–0.07)
Basophils Absolute: 0 10*3/uL (ref 0.0–0.1)
Basophils Relative: 0 %
Eosinophils Absolute: 0 10*3/uL (ref 0.0–0.5)
Eosinophils Relative: 0 %
HCT: 42.3 % (ref 36.0–46.0)
Hemoglobin: 12.5 g/dL (ref 12.0–15.0)
Immature Granulocytes: 0 %
Lymphocytes Relative: 12 %
Lymphs Abs: 1 10*3/uL (ref 0.7–4.0)
MCH: 25.5 pg — ABNORMAL LOW (ref 26.0–34.0)
MCHC: 29.6 g/dL — ABNORMAL LOW (ref 30.0–36.0)
MCV: 86.2 fL (ref 80.0–100.0)
Monocytes Absolute: 0.6 10*3/uL (ref 0.1–1.0)
Monocytes Relative: 7 %
Neutro Abs: 6.5 10*3/uL (ref 1.7–7.7)
Neutrophils Relative %: 81 %
Platelets: 283 10*3/uL (ref 150–400)
RBC: 4.91 MIL/uL (ref 3.87–5.11)
RDW: 19.4 % — ABNORMAL HIGH (ref 11.5–15.5)
WBC: 8.1 10*3/uL (ref 4.0–10.5)
nRBC: 0 % (ref 0.0–0.2)

## 2020-10-12 LAB — CULTURE, BLOOD (ROUTINE X 2)
Culture: NO GROWTH
Culture: NO GROWTH

## 2020-10-12 LAB — MAGNESIUM: Magnesium: 2.6 mg/dL — ABNORMAL HIGH (ref 1.7–2.4)

## 2020-10-12 MED ORDER — SPIRONOLACTONE 12.5 MG HALF TABLET
12.5000 mg | ORAL_TABLET | Freq: Every day | ORAL | Status: DC
  Administered 2020-10-12: 12.5 mg via ORAL
  Filled 2020-10-12 (×2): qty 1

## 2020-10-12 MED ORDER — FUROSEMIDE 40 MG PO TABS
40.0000 mg | ORAL_TABLET | Freq: Every day | ORAL | Status: DC
  Administered 2020-10-12 – 2020-10-13 (×2): 40 mg via ORAL
  Filled 2020-10-12 (×2): qty 1

## 2020-10-12 NOTE — Progress Notes (Signed)
PROGRESS NOTE  Lorraine Harris YTK:[id number] DOB: 02/11/1951 DOA: 10/06/2020 PCP: Everardo Beals, NP  HPI/Recap of past 24 hours:   70 y.o. chronic systolic heart failure last EF 35 to 40%, paroxysmal atrial fibrillation CHADS2 score >3 on Xarelto  hypertension, chronic kidney disease stage II, hyperlipidemia, depression, morbid obesity BMI >55  has been experiencing increasing bilateral lower extremity edema over the last 1 week and particularly on the left lower extremity patient also noticed some discharge and erythema.  She has chronic shortness of breath and denies any chest pain.   Went to urgent care and referred to ED because of rapid heart rate  Work-up in the ED revealed left lower extremity cellulitis, acute hypoxic respiratory failure, A. fib with RVR, COVID-19 viral pneumonia, and acute on chronic systolic CHF.  CT chest was negative for PE  Assessment/Plan: Principal Problem:   Atrial fibrillation with RVR (HCC) Active Problems:   Morbid obesity ([place])   Essential hypertension   Acute on chronic systolic CHF (congestive heart failure) ([place])   Cellulitis of left leg   Atrial fibrillation with rapid ventricular response (HCC)  Left lower extremity cellulitis, POA Started on antibiotics 2/4--narrow to p.o. Keflex to complete 14 days total ending on 2/17  COVID-19 viral pneumonia, poa COVID-19 screening test positive on 10/07/2020 Evidence of atypical pneumonia on CT chest. All biomarkers have resolved and was transitioned to p.o. steroids 2/8--will give a total of 10 days of steroids ending 2/13  Improving acute hypoxic respiratory failure secondary to COVID-19 viral pneumonia acute on chronic systolic CHF. BNP 611, bilateral lower extremity edema Initially requiring 4 to 6 L, now down to 2 L with O2 saturation in the mid 90s and not requiring oxygen currently. IV Lasix 1 time a day-->Lasix 40 daily 2/9 Completed 5 days of remdesivir 2/9-can discontinue  baricitinib  A. fib with RVR, CHad2vasc 4 Initially started on Cardizem drip, stopped on 10/08/2020. Toprol adjusted to tartrate 75 3 times daily Digoxin loaded 2/9 and start 0.125 daily Continue Aldactone 25 daily Continue Xarelto for CVA prevention.  Acute on chronic systolic CHF Last 2D echo 04/22/2019 LVEF 35 to 40% Continue regimen as recommended by cardiology Fluid status -1.5 Net weights 1 49-1 45  Anxiety Continue Xanax 0.5 3 times daily as needed, Wellbutrin 150 daily, trazodone 50 at bedtime, Effexor 75 daily  DM TY 2 Continue sliding scale coverage blood sugars ranging 150s to 200 eating 100% of meals likely worsened secondary to steroids prednisone 50 Low CBG previously 71 today monitor trends  Severe morbid obesity BMI 56 Recommend weight loss outpatient with regular physical activity and healthy dieting.  CKD 3B Back to her baseline creatinine 1.2 with GFR 46. Continue to avoid nephrotoxins and monitor urine output   Code Status: Full code. Family communication: Updated son Sherren Mocha [phone] on 2/10 Disposition Plan: Likely discharge to home with home health services.   Consultants:  Cardiology  Procedures:  None.  Antimicrobials:  Cefazolin, started on 10/07/20.  DVT prophylaxis: Xarelto.  Status is: Inpatient  Dispo:  Patient From: Home  Planned Disposition: Home with Health Care Svc  Expected discharge date: 10/13/2020  Medically stable for discharge: No, ongoing management of left lower extremity cellulitis, A. fib with RVR, acute hypoxia, acute on chronic systolic CHF.    Subjective   slightly sleepy did not sleep at all last night overall feels well no chest pain no fever heart rate predominantly in the low 100 range A. Fib Blood sugar has dropped  overnight   Objective: Vitals:   10/11/20 1100 10/11/20 2101 10/12/20 0058 10/12/20 0415  BP: 93/74 113/86 124/74 116/80  Pulse:  91 (!) 138 (!) 102  Resp: 20 19  15   Temp: (!) 97.5 F  (36.4 C) 97.7 F (36.5 C)  (!) 97.5 F (36.4 C)  TempSrc: Axillary Oral  Oral  SpO2: 95% 94% 93% 98%  Weight:      Height:        Intake/Output Summary (Last 24 hours) at 10/12/2020 1211 Last data filed at 10/12/2020 1028 Gross per 24 hour  Intake 980 ml  Output 2400 ml  Net -1420 ml   Filed Weights   10/06/20 2007 10/09/20 0400  Weight: (!) 149.7 kg (!) 145.8 kg    Exam:  Awake coherent obese no distress EOMI NCAT awakens but is slightly sleepy at times no deficit No rales rhonchi Abdomen obese nontender Wound not examined today Red throughout however swelling is decreased ROM intact  Data Reviewed: CBC: Recent Labs  Lab 10/08/20 0445 10/09/20 0337 10/10/20 0424 10/11/20 0426 10/12/20 0427  WBC 4.8 8.8 9.3 8.2 8.1  NEUTROABS 4.3 7.9* 8.6* 7.1 6.5  HGB 12.2 11.8* 11.5* 11.8* 12.5  HCT 40.2 38.1 38.5 39.4 42.3  MCV 84.8 83.4 85.2 85.1 86.2  PLT 392 295 334 286 329   Basic Metabolic Panel: Recent Labs  Lab 10/08/20 0445 10/09/20 0337 10/10/20 0424 10/11/20 0426 10/12/20 0427  NA 135 136 133* 137 139  K 3.6 3.7 4.3 4.3 5.0  CL 95* 94* 94* 97* 95*  CO2 28 28 28 31  35*  GLUCOSE 151* 165* 168* 165* 99  BUN 26* 30* 40* 45* 43*  CREATININE 1.30* 1.25* 1.26* 1.18* 1.00  CALCIUM 8.7* 8.6* 8.6* 8.7* 8.7*  MG 2.4 2.4 2.4 2.5* 2.6*  PHOS 3.7 3.2 2.5 1.9*  --    GFR: Estimated Creatinine Clearance: 75.3 mL/min (by C-G formula based on SCr of 1 mg/dL). Liver Function Tests: Recent Labs  Lab 10/08/20 0445 10/09/20 0337 10/10/20 0424 10/11/20 0426 10/12/20 0427  AST 17 14* 15 31 35  ALT 14 11 11 18 25   ALKPHOS 62 54 60 81 65  BILITOT 0.5 0.5 0.6 0.6 0.7  PROT 6.9 6.0* 6.2* 6.3* 6.3*  ALBUMIN 3.3* 3.1* 3.2* 3.3* 3.3*   No results for input(s): LIPASE, AMYLASE in the last 168 hours. No results for input(s): AMMONIA in the last 168 hours. Coagulation Profile: No results for input(s): INR, PROTIME in the last 168 hours. Cardiac Enzymes: No results for  input(s): CKTOTAL, CKMB, CKMBINDEX, TROPONINI in the last 168 hours. BNP (last 3 results) No results for input(s): PROBNP in the last 8760 hours. HbA1C: No results for input(s): HGBA1C in the last 72 hours. CBG: Recent Labs  Lab 10/11/20 1127 10/11/20 1713 10/11/20 2053 10/12/20 0744 10/12/20 1141  GLUCAP 164* 95 158* 71 130*   Lipid Profile: No results for input(s): CHOL, HDL, LDLCALC, TRIG, CHOLHDL, LDLDIRECT in the last 72 hours. Thyroid Function Tests: No results for input(s): TSH, T4TOTAL, FREET4, T3FREE, THYROIDAB in the last 72 hours. Anemia Panel: Recent Labs    10/10/20 0424 10/11/20 0426  FERRITIN 23 24   Urine analysis:    Component Value Date/Time   COLORURINE YELLOW 06/19/2013 0304   APPEARANCEUR CLEAR 06/19/2013 0304   LABSPEC 1.032 (H) 06/19/2013 0304   PHURINE 5.0 06/19/2013 0304   GLUCOSEU NEGATIVE 06/19/2013 0304   HGBUR NEGATIVE 06/19/2013 0304   BILIRUBINUR NEGATIVE 06/19/2013 0304   KETONESUR  NEGATIVE 06/19/2013 0304   PROTEINUR NEGATIVE 06/19/2013 0304   UROBILINOGEN 0.2 06/19/2013 0304   NITRITE NEGATIVE 06/19/2013 0304   LEUKOCYTESUR NEGATIVE 06/19/2013 0304   Sepsis Labs: @LABRCNTIP (procalcitonin:4,lacticidven:4)  ) Recent Results (from the past 240 hour(s))  SARS CORONAVIRUS 2 (TAT 6-24 HRS) Nasopharyngeal Nasopharyngeal Swab     Status: Abnormal   Collection Time: 10/07/20 12:00 AM   Specimen: Nasopharyngeal Swab  Result Value Ref Range Status   SARS Coronavirus 2 POSITIVE (A) NEGATIVE Final    Comment: (NOTE) SARS-CoV-2 target nucleic acids are DETECTED.  The SARS-CoV-2 RNA is generally detectable in upper and lower respiratory specimens during the acute phase of infection. Positive results are indicative of the presence of SARS-CoV-2 RNA. Clinical correlation with patient history and other diagnostic information is  necessary to determine patient infection status. Positive results do not rule out bacterial infection or  co-infection with other viruses.  The expected result is Negative.  Fact Sheet for Patients: SugarRoll.be  Fact Sheet for Healthcare Providers: https://www.woods-mathews.com/  This test is not yet approved or cleared by the [place] FDA and  has been authorized for detection and/or diagnosis of SARS-CoV-2 by FDA under an Emergency Use Authorization (EUA). This EUA will remain  in effect (meaning this test can be used) for the duration of the COVID-19 declaration under Section 564(b)(1) of the Act, 21 U. S.C. section 360bbb-3(b)(1), unless the authorization is terminated or revoked sooner.   Performed at [facility], [place] [address], [place], [place] [address]   Culture, blood (routine x 2)     Status: None   Collection Time: 10/07/20  1:32 AM   Specimen: BLOOD  Result Value Ref Range Status   Specimen Description   Final    BLOOD LEFT ANTECUBITAL Performed at [facility] [address], [place], [place] [address]    Special Requests   Final    BOTTLES DRAWN AEROBIC ONLY Blood Culture results may not be optimal due to an inadequate volume of blood received in culture bottles Performed at [facility] [address] [place], [place] [address]    Culture   Final    NO GROWTH 5 DAYS Performed at [facility], [place] [address], [place], [place] [address]    Report Status 10/12/2020 FINAL  Final  Culture, blood (routine x 2)     Status: None   Collection Time: 10/07/20  1:33 AM   Specimen: BLOOD  Result Value Ref Range Status   Specimen Description   Final    BLOOD RIGHT WRIST Performed at [facility] [address] [place], [place] [address]    Special Requests   Final    BOTTLES DRAWN AEROBIC AND ANAEROBIC Blood Culture results may not be optimal due to an inadequate volume of blood received in culture bottles Performed at [facility] [address] [place], [place] [address]    Culture   Final    NO GROWTH 5 DAYS Performed at [facility], [place] [address] [place], [place] [address]    Report Status 10/12/2020 FINAL  Final  MRSA PCR Screening     Status: None   Collection Time: 10/09/20  5:30 PM   Specimen: Nasopharyngeal  Result Value Ref Range Status   MRSA by PCR NEGATIVE NEGATIVE Final    Comment:        The GeneXpert MRSA Assay (FDA approved for NASAL specimens only), is one component  of a comprehensive MRSA colonization surveillance program. It is not intended to diagnose MRSA infection nor to guide or monitor treatment for MRSA infections. Performed at [facility], [place] [address], [place], [place] [address]       Studies: No results found.  Scheduled Meds: . vitamin C  500 mg Oral Daily  . atorvastatin  10 mg Oral Daily  . buPROPion  150 mg Oral Daily  . cephALEXin  500 mg Oral Q12H  . cholecalciferol  1,000 Units Oral Daily  . digoxin  0.125 mg Oral Daily  . furosemide  40 mg Oral Daily  . insulin aspart  0-15 Units Subcutaneous TID WC  . Ipratropium-Albuterol  1 puff Inhalation TID  . metFORMIN  500 mg Oral BID WC  . metoprolol tartrate  75 mg Oral Q8H  . multivitamin with minerals  1 tablet Oral Daily  . predniSONE  50 mg Oral Daily  . rivaroxaban  20 mg Oral Q supper  . spironolactone  12.5 mg Oral Daily  . venlafaxine  75 mg Oral Daily  . zinc sulfate  220 mg Oral Daily    Continuous Infusions:   LOS: 5 days   Verneita Griffes, MD [facility] 12:22 PM   If 7PM-7AM, please contact night-coverage [email or web address] Password [facility] 10/12/2020, 12:11 PM

## 2020-10-12 NOTE — TOC Progression Note (Addendum)
Transition of Care [facility]) - Progression Note    Patient Details  Name: Lorraine Harris MRN: [id number] Date of Birth: Jun 03, 1951  [facility]) CM/SW Contact  Ross Ludwig, [place] Phone Number: 10/12/2020, 10:47 AM  Clinical Narrative:     CSW spoke to patient's son and he stated the patient chose [facility] for home health services.  CSW contacted [facility], they can accept patient with HH PT, OT, RN, Aide, and Social work.  Per home health agency they requested that wound care be changed to Sunday, Tuesday, Thursday due to staffing.  CSW notified attending physician.  Per patient's son, they will need a rolling walker, 3 in 1, a tub bench, and oxygen.  CSW to continue to follow patient's progress throughout discharge planning.  3:15pm  CSW spoke to [place] at [place] to order DME for patient.  Per Thedore Mins, [facility] will deliver equipment tomorrow so it will be ready for patient to discharge once she is medically ready.   Expected Discharge Plan: [place] Barriers to Discharge: Continued Medical Work up  Expected Discharge Plan and Services Expected Discharge Plan: [place] Choice: [place] arrangements for the past 2 months: Single Family Home                                       Social Determinants of Health (SDOH) Interventions    Readmission Risk Interventions No flowsheet data found.

## 2020-10-13 LAB — COMPREHENSIVE METABOLIC PANEL
ALT: 22 U/L (ref 0–44)
AST: 24 U/L (ref 15–41)
Albumin: 3.2 g/dL — ABNORMAL LOW (ref 3.5–5.0)
Alkaline Phosphatase: 66 U/L (ref 38–126)
Anion gap: 8 (ref 5–15)
BUN: 43 mg/dL — ABNORMAL HIGH (ref 8–23)
CO2: 33 mmol/L — ABNORMAL HIGH (ref 22–32)
Calcium: 9.2 mg/dL (ref 8.9–10.3)
Chloride: 96 mmol/L — ABNORMAL LOW (ref 98–111)
Creatinine, Ser: 0.98 mg/dL (ref 0.44–1.00)
GFR, Estimated: >60 mL/min (ref >60)
Glucose, Bld: 88 mg/dL (ref 70–99)
Potassium: 5 mmol/L (ref 3.5–5.1)
Sodium: 137 mmol/L (ref 135–145)
Total Bilirubin: 0.7 mg/dL (ref 0.3–1.2)
Total Protein: 6.4 g/dL — ABNORMAL LOW (ref 6.5–8.1)

## 2020-10-13 LAB — GLUCOSE, CAPILLARY
Glucose-Capillary: 129 mg/dL — ABNORMAL HIGH (ref 70–99)
Glucose-Capillary: 86 mg/dL (ref 70–99)

## 2020-10-13 LAB — MAGNESIUM: Magnesium: 2.3 mg/dL (ref 1.7–2.4)

## 2020-10-13 MED ORDER — DIGOXIN 125 MCG PO TABS: 0.1250 mg | ORAL_TABLET | Freq: Every day | ORAL | 1 refills | Status: AC

## 2020-10-13 MED ORDER — CEPHALEXIN 500 MG PO CAPS: 500.0000 mg | ORAL_CAPSULE | Freq: Two times a day (BID) | ORAL | 0 refills | Status: AC

## 2020-10-13 MED ORDER — METFORMIN HCL 500 MG PO TABS: 500.0000 mg | ORAL_TABLET | Freq: Two times a day (BID) | ORAL | 2 refills | Status: AC

## 2020-10-13 MED ORDER — METOPROLOL TARTRATE 75 MG PO TABS: 75.0000 mg | ORAL_TABLET | Freq: Three times a day (TID) | ORAL | 1 refills | Status: DC

## 2020-10-13 MED ORDER — PREDNISONE 50 MG PO TABS: ORAL_TABLET | ORAL | 0 refills | Status: DC

## 2020-10-13 NOTE — Care Management Important Message (Signed)
Medicare IM printed for Lorraine Harris to give to the patient.  

## 2020-10-13 NOTE — Plan of Care (Signed)
Reviewed over AVS in detail with patient & grandson. Changed pt L leg dressing just prior to d/c & provided pt with extra supplies for home incase she needs it. Home O2 tank with pt & portable, pt purse, charger & clothes with pt when wheeled to grandsons car. All questions & concerns addressed. Instructed to [facility] all [facility]s at the [facility] & follow the given times filled out on AVS.

## 2020-10-13 NOTE — TOC Transition Note (Addendum)
Transition of Care [facility]) - CM/SW Discharge Note   Patient Details  Name: Lorraine Harris MRN: [id number] Date of Birth: 09-11-1950  Transition of Care [facility]) CM/SW Contact:  Ross Ludwig, LCSW Phone Number: 10/13/2020, 1:04 PM   Clinical Narrative:     Patient will be going home with home health through [facility].  CSW spoke to patient and she stated she does not need any other equipment, she has the commode, shower bench, and walker at home already.  She only needed the oxygen which has been delivered. CSW signing off please reconsult with any other social work needs, home health agency has been notified of planned discharge for today.  Patient stated her grandson will be able to pick her up and bring her home.    Final next level of care: [place] Barriers to Discharge: Barriers Resolved   Patient Goals and CMS Choice Patient states their goals for this hospitalization and ongoing recovery are:: To go home with home health services through [facility].gov Compare Post Acute Care list provided to:: Patient Represenative (must comment) Choice offered to / list presented to : Adult Children  Discharge Placement                    Patient and family notified of of transfer: 10/13/20  Discharge Plan and Services     Post Acute Care Choice: Home Health            DME Agency: [facility] Date DME Agency Contacted: 10/13/20 Time DME Agency Contacted: 62   HH Arranged: RN,PT,OT,Nurse's Aide [place]: [facility] Date [facility] Agency Contacted: 10/13/20 Time [place]: 1303 Representative spoke with at [place]: Cindie  Social Determinants of Health ([place]) Interventions     Readmission Risk Interventions No flowsheet data found.

## 2020-10-13 NOTE — Plan of Care (Signed)
SATURATION QUALIFICATIONS: (This note is used to comply with regulatory documentation for home oxygen)  Patient Saturations on Room Air at Rest = 88%  Patient Saturations on Room Air while Ambulating = 83%  Patient Saturations on 2 Liters of oxygen while Ambulating = 90%  Please briefly explain why patient needs home oxygen: to safely maintain oxygenation

## 2020-10-13 NOTE — Plan of Care (Signed)
  Problem: Health Behavior/Discharge Planning: Goal: Ability to manage health-related needs will improve Outcome: Progressing   Problem: Clinical Measurements: Goal: Ability to maintain clinical measurements within normal limits will improve Outcome: Progressing   Problem: Clinical Measurements: Goal: Respiratory complications will improve Outcome: Progressing   Problem: Nutrition: Goal: Adequate nutrition will be maintained Outcome: Progressing   Problem: Respiratory: Goal: Will maintain a patent airway Outcome: Progressing   Problem: Respiratory: Goal: Complications related to the disease process, condition or treatment will be avoided or minimized Outcome: Progressing

## 2020-10-13 NOTE — Discharge Summary (Signed)
Physician Discharge Summary  Lorraine Harris HQP:[id number] DOB: 1951/08/29 DOA: 10/06/2020  PCP: Everardo Beals, NP  Admit date: 10/06/2020 Discharge date: 10/13/2020  Time spent: 37 minutes  Recommendations for Outpatient Follow-up:  1. Requires outpatient close monitoring with digoxin level in several weeks and titration of blood pressure and diuretic medications as had to discontinue Aldactone this admission 2. Recommend steroids as below to complete Covid treatment 3. Recommend antibiotics Keflex to finish treatment of lower extremity cellulitis and wound care as per below 4. Will require home health physical therapy and home oxygen which have been ordered 5. Recommend Chem-12 CBC 1 week 6. Will need outpatient titration of Metformin-she does not have diabetes but may have impaired glucose tolerance likely secondary to steroids and this may need to be readdressed in the outpatient setting  Discharge Diagnoses:  Principal Problem:   Atrial fibrillation with RVR ([place]) Active Problems:   Morbid obesity ([place])   Essential hypertension   Acute on chronic systolic CHF (congestive heart failure) ([place])   Cellulitis of left leg   Atrial fibrillation with rapid ventricular response [facility])   Discharge Condition: Improved  Diet recommendation: Diabetic heart healthy  Filed Weights   10/06/20 2007 10/09/20 0400 10/13/20 0300  Weight: (!) 149.7 kg (!) 145.8 kg (!) 144.6 kg    History of present illness:  70 y.o. chronic systolic heart failure last EF 35 to 40%, paroxysmal atrial fibrillation CHADS2 score >3 on Xarelto  hypertension, chronic kidney disease stage II, hyperlipidemia, depression, morbid obesity BMI >55  has been experiencing increasing bilateral lower extremity edema over the last 1 week and particularly on the left lower extremity patient also noticed some discharge and erythema.  She has chronic shortness of breath and denies any chest pain.   Went to urgent care and  referred to ED because of rapid heart rate  Work-up in the ED revealed left lower extremity cellulitis, acute hypoxic respiratory failure, A. fib with RVR, COVID-19 viral pneumonia, and acute on chronic systolic CHF.  CT chest was negative for PE   Hospital Course:  Left lower extremity cellulitis, POA Started on antibiotics 2/4--narrow to p.o. Keflex to complete 14 days total ending on 2/17  COVID-19 viral pneumonia, poa COVID-19 screening test positive on 10/07/2020 Evidence of atypical pneumonia on CT chest. All biomarkers have resolved and was transitioned to p.o. steroids 2/8--will give a total of 10 days of steroids ending 2/13   Improving acute hypoxic respiratory failure secondary to COVID-19 viral pneumonia acute on chronic systolic CHF. BNP 611, bilateral lower extremity edema Initially requiring 4 to 6 L, now down to 2 L with O2 saturation in the mid 90s Has intermittently required oxygen so will discharge on the same IV Lasix 1 time a day-->Lasix 40 daily 2/9 Aldactone had to be discontinued to help with control of rate control as above Completed 5 days of remdesivir 2/9-baricitinib discontinued  A. fib with RVR, CHad2vasc 4 Initially started on Cardizem drip, stopped on 10/08/2020. Toprol adjusted to tartrate 75 3 times daily Digoxin loaded 2/9 and start 0.125 daily after discussion with cardiologist Dr. Einar Gip who made recommendations to use this medication Heart rates are moderately controlled in the 110s to 120s with activity although this is probably also secondary to deconditioning Aldactone discontinued as below Continue Xarelto for CVA prevention.  Acute on chronic systolic CHF Last 2D echo 04/22/2019 LVEF 35 to 40% Continue regimen as recommended by cardiology Fluid status -2.6 L Net weights 1 49-144 on discharge  Anxiety Continue Xanax 0.5 3 times daily as needed, Wellbutrin 150 daily, trazodone 50 at bedtime, Effexor 75 daily  DM TY 2 Continue sliding  scale coverage blood sugars ranging 150s to 200 eating 100% of meals likely worsened secondary to steroids prednisone 50 Low CBG previously at times  Severe morbid obesity BMI 56 Recommend weight loss outpatient with regular physical activity and healthy dieting.  CKD 3B with mild hyperkalemia Back to her baseline creatinine 0.9 with GFR 46. Continue to avoid nephrotoxins and monitor urine output  Procedures: Echocardiogram Consultations:  Dr. Einar Gip and Terri Skains in cardiology  Discharge Exam: Vitals:   10/13/20 0524 10/13/20 0554  BP: 100/80 105/89  Pulse: 60 80  Resp: 18   Temp: 98.1 F (36.7 C)   SpO2: 100% 98%    General: Awake alert thick neck Mallampati 4 Cardiovascular: S1-S2 A. fib rates 110s to 120s Respiratory: Clinically clear no added sounds no rales no rhonchi no wheeze Both lower extremities are red slightly swollen  Discharge Instructions   Discharge Instructions    Diet - low sodium heart healthy   Complete by: As directed    Discharge instructions   Complete by: As directed    You will need to complete the steroids as instructed for COVID to help your lungs recover from that infection and you will need oxygen which we will order You also have been given a prescription for lower extremity cellulitis infection likely secondary to mild fluid overload and heart failure which then worsened with the fluid in your legs causing you to get an infection-please complete the Keflex which is an antibiotic for this Numerous new medications have been started one of them is digoxin which is a good medication but needs to be monitored closely-please follow-up with Dr. Einar Gip in the outpatient setting who will monitor this get labs and adjust dosing of your medications also noticed that your metoprolol dosing has increased to help control your heart rate  it would be beneficial once you have recovered to follow-up with your primary physician-you will probably need dressings of  your lower extremities to be done as per the wound care nurses who will come out and dressed them at least 3 times a week we will order home health also to come out and help and we have ordered the equipment   Discharge wound care:   Complete by: As directed    Wound care  See Comments      Comments: Cleanse wound to left leg with NS and pat dry.  Apply Aquacel Ag to open wound (LAWSON # [id number])  Cover with gauze  Wrap with kerlix and ace bandage.  Change Mon/Wed/Fri.   Increase activity slowly   Complete by: As directed      Allergies as of 10/13/2020      Reactions   Morphine And Related Other (See Comments)   Seizure   Valium [diazepam] Other (See Comments)   "I can't walk"   Latex Rash      Medication List    STOP taking these medications   metoprolol succinate 50 MG 24 hr tablet Commonly known as: TOPROL-XL   spironolactone 25 MG tablet Commonly known as: ALDACTONE     TAKE these medications   albuterol 108 (90 Base) MCG/ACT inhaler Commonly known as: VENTOLIN HFA Inhale 1 puff into the lungs every 6 (six) hours as needed for wheezing or shortness of breath.   ALPRAZolam 0.5 MG tablet Commonly known as: XANAX Take 0.5 mg by  mouth 3 (three) times daily as needed for anxiety.   atorvastatin 10 MG tablet Commonly known as: LIPITOR TAKE 1 TABLET BY MOUTH EVERY DAY   buPROPion 150 MG 24 hr tablet Commonly known as: WELLBUTRIN XL Take 150 mg by mouth daily.   cephALEXin 500 MG capsule Commonly known as: KEFLEX Take 1 capsule (500 mg total) by mouth every 12 (twelve) hours for 6 days.   digoxin 0.125 MG tablet Commonly known as: LANOXIN Take 1 tablet (0.125 mg total) by mouth daily. Start taking on: October 14, 2020   diphenhydrAMINE 25 MG tablet Commonly known as: BENADRYL Take 25 mg by mouth every 6 (six) hours as needed for allergies.   furosemide 40 MG tablet Commonly known as: LASIX TAKE 1 TABLET BY MOUTH EVERY DAY   metFORMIN 500 MG tablet Commonly  known as: GLUCOPHAGE Take 1 tablet (500 mg total) by mouth 2 (two) times daily with a meal.   Metoprolol Tartrate 75 MG Tabs Take 75 mg by mouth every 8 (eight) hours.   predniSONE 50 MG tablet Commonly known as: DELTASONE Complete meds Start taking on: October 14, 2020   venlafaxine 75 MG tablet Commonly known as: EFFEXOR Take 75 mg by mouth daily.   Xarelto 15 MG Tabs tablet Generic drug: Rivaroxaban TAKE 1 TABLET (15 MG TOTAL) BY MOUTH DAILY WITH SUPPER.            Durable Medical Equipment  (From admission, onward)         Start     Ordered   10/13/20 1124  DME Oxygen  Once       Question Answer Comment  Length of Need 6 Months   Mode or (Route) Nasal cannula   Liters per Minute 2   Frequency Continuous (stationary and portable oxygen unit needed)   Oxygen delivery system Gas      10/13/20 1127   10/12/20 1216  For home use only DME Walker wide  Once       Question:  Patient needs a walker to treat with the following condition  Answer:  Wound cellulitis   10/12/20 1217   10/12/20 1216  For home use only DME Tub bench  Once        10/12/20 1217           Discharge Care Instructions  (From admission, onward)         Start     Ordered   10/13/20 0000  Discharge wound care:       Comments: Wound care  See Comments      Comments: Cleanse wound to left leg with NS and pat dry.  Apply Aquacel Ag to open wound (LAWSON # [id number])  Cover with gauze  Wrap with kerlix and ace bandage.  Change Mon/Wed/Fri.   10/13/20 1127         Allergies  Allergen Reactions  . Morphine And Related Other (See Comments)    Seizure  . Valium [Diazepam] Other (See Comments)    "I can't walk"  . Latex Rash      The results of significant diagnostics from this hospitalization (including imaging, microbiology, ancillary and laboratory) are listed below for reference.    Significant Diagnostic Studies: CT Angio Chest PE W/Cm &/Or Wo Cm  Result Date: 10/06/2020 CLINICAL  DATA:  70 year old female with concern for pulmonary embolism. EXAM: CT ANGIOGRAPHY CHEST WITH CONTRAST TECHNIQUE: Multidetector CT imaging of the chest was performed using the standard protocol during bolus administration of  intravenous contrast. Multiplanar CT image reconstructions and MIPs were obtained to evaluate the vascular anatomy. CONTRAST:  178mL OMNIPAQUE IOHEXOL 350 MG/ML SOLN COMPARISON:  Chest radiograph dated 03/05/2019. FINDINGS: Evaluation is limited due to streak artifact caused by patient's arms. Cardiovascular: There is mild cardiomegaly. No pericardial effusion. Coronary vascular calcification and calcification of the mitral annulus. There is retrograde flow of contrast from the right atrium into the IVC suggestive of a degree of right heart dysfunction. There is mild atherosclerotic calcification of the thoracic aorta. The aorta is tortuous. Mild dilatation of the main pulmonary trunk suggestive of pulmonary hypertension. Clinical correlation is recommended. Evaluation of the pulmonary arteries is limited due to respiratory motion artifact. No large or central pulmonary artery embolus identified. Mediastinum/Nodes: No hilar or mediastinal adenopathy. The esophagus is grossly unremarkable. No mediastinal fluid collection. Lungs/Pleura: Diffuse bilateral streaky and interstitial densities may represent atelectasis or atypical infection. Clinical correlation is recommended. No pleural effusion or pneumothorax. The central airways are patent. Upper Abdomen: Indeterminate 2.5 cm right renal upper pole hypodense lesion corresponding to the cyst seen on the prior CT. Musculoskeletal: Degenerative changes of the spine. No acute osseous pathology. Review of the MIP images confirms the above findings. IMPRESSION: 1. No CT evidence of central pulmonary artery embolus. 2. Mild cardiomegaly with findings of right heart dysfunction. Clinical correlation is recommended. 3. Diffuse bilateral streaky and  interstitial densities may represent atelectasis or atypical infection. Clinical correlation is recommended. 4. Aortic Atherosclerosis (ICD10-I70.0). Electronically Signed   By: Anner Crete M.D.   On: 10/06/2020 22:55   VAS [place] LOWER EXTREMITY VENOUS (DVT)  Result Date: 10/08/2020  Lower Venous DVT Study Other Indications: Covid. Limitations: Body habitus. Comparison Study: No previous exam Performing Technologist: Vonzell Schlatter  Examination Guidelines: A complete evaluation includes B-mode imaging, spectral Doppler, color Doppler, and power Doppler as needed of all accessible portions of each vessel. Bilateral testing is considered an integral part of a complete examination. Limited examinations for reoccurring indications may be performed as noted. The reflux portion of the exam is performed with the patient in reverse Trendelenburg.  +---------+---------------+---------+-----------+----------+--------------+ RIGHT    CompressibilityPhasicitySpontaneityPropertiesThrombus Aging +---------+---------------+---------+-----------+----------+--------------+ CFV      Full           Yes      Yes                                 +---------+---------------+---------+-----------+----------+--------------+ SFJ      Full                                                        +---------+---------------+---------+-----------+----------+--------------+ FV Prox  Full                                                        +---------+---------------+---------+-----------+----------+--------------+ FV Mid   Full                                                        +---------+---------------+---------+-----------+----------+--------------+  FV DistalFull                                                        +---------+---------------+---------+-----------+----------+--------------+ PFV      Full                                                         +---------+---------------+---------+-----------+----------+--------------+ POP      Full           Yes      Yes                                 +---------+---------------+---------+-----------+----------+--------------+ PTV      Full                                                        +---------+---------------+---------+-----------+----------+--------------+ PERO     Full                                                        +---------+---------------+---------+-----------+----------+--------------+   +---------+---------------+---------+-----------+----------+--------------+ LEFT     CompressibilityPhasicitySpontaneityPropertiesThrombus Aging +---------+---------------+---------+-----------+----------+--------------+ CFV      Full           Yes      Yes                                 +---------+---------------+---------+-----------+----------+--------------+ SFJ      Full                                                        +---------+---------------+---------+-----------+----------+--------------+ FV Prox  Full                                                        +---------+---------------+---------+-----------+----------+--------------+ FV Mid   Full                                                        +---------+---------------+---------+-----------+----------+--------------+ FV DistalFull                                                        +---------+---------------+---------+-----------+----------+--------------+  PFV      Full                                                        +---------+---------------+---------+-----------+----------+--------------+ POP      Full           Yes      Yes                                 +---------+---------------+---------+-----------+----------+--------------+ PTV      Full                                                         +---------+---------------+---------+-----------+----------+--------------+ PERO     Full                                                        +---------+---------------+---------+-----------+----------+--------------+     Summary: BILATERAL: - No evidence of deep vein thrombosis seen in the lower extremities, bilaterally. -No evidence of popliteal cyst, bilaterally.   *See table(s) above for measurements and observations. Electronically signed by Monica Martinez MD on 10/08/2020 at 2:32:49 PM.    Final    ECHOCARDIOGRAM LIMITED  Result Date: 10/09/2020    ECHOCARDIOGRAM LIMITED REPORT   Patient Name:   LODIE WAHEED Date of Exam: 10/09/2020 Medical Rec #:  [id number]           Height:       64.0 in Accession #:    [id number]          Weight:       321.4 lb Date of Birth:  1951/01/27            BSA:          2.393 m Patient Age:    50 years            BP:           90/76 mmHg Patient Gender: F                   HR:           118 bpm. Exam Location:  Inpatient Procedure: Limited Echo, Limited Color Doppler, Cardiac Doppler and Intracardiac            Opacification Agent Indications:     CHF-Acute Systolic F02.77  History:         Patient has prior history of Echocardiogram examinations, most                  recent 04/22/2019. Arrythmias:Atrial Fibrillation; Risk                  Factors:Hypertension and Dyslipidemia. COVID 19.  Sonographer:     Darlina Sicilian RDCS Referring Phys:  [id number] [place] Diagnosing Phys: Adrian Prows MD  Sonographer Comments: Suboptimal parasternal window and Technically difficult study due to poor echo windows. IMPRESSIONS  1.  Left ventricular ejection fraction, by estimation, is 35 to 40%. Left ventricular ejection fraction by 2D MOD biplane is 42.7 %. The left ventricle has mild to moderately decreased function. The left ventricle demonstrates global hypokinesis. Left ventricular diastolic parameters are indeterminate.  2. Right ventricular systolic function was not well  visualized. There is moderately elevated pulmonary artery systolic pressure. The estimated right ventricular systolic pressure is 94.7 mmHg.  3. Left atrial size was moderately dilated.  4. The mitral valve is grossly normal. Trivial mitral valve regurgitation.  5. The aortic valve is calcified. Aortic valve regurgitation is not visualized. Mild aortic valve stenosis.  6. The inferior vena cava is dilated in size with <50% respiratory variability, suggesting right atrial pressure of 15 mmHg. Comparison(s): Compared to 04/22/2019, no significant change in EF 35-40%$. FINDINGS  Left Ventricle: Left ventricular ejection fraction, by estimation, is 35 to 40%. Left ventricular ejection fraction by 2D MOD biplane is 42.7 %. The left ventricle has mild to moderately decreased function. The left ventricle demonstrates global hypokinesis. Definity contrast agent was given IV to delineate the left ventricular endocardial borders. The left ventricular internal cavity size was normal in size. There is no left ventricular hypertrophy. Abnormal (paradoxical) septal motion, consistent with left bundle branch block. Left ventricular diastolic parameters are indeterminate. Right Ventricle: Right ventricular systolic function was not well visualized. There is moderately elevated pulmonary artery systolic pressure. The tricuspid regurgitant velocity is 2.65 m/s, and with an assumed right atrial pressure of 15 mmHg, the estimated right ventricular systolic pressure is 09.6 mmHg. Left Atrium: Left atrial size was moderately dilated. Right Atrium: Right atrial size was not well visualized. Pericardium: There is no evidence of pericardial effusion. Mitral Valve: The mitral valve is grossly normal. Trivial mitral valve regurgitation. Tricuspid Valve: The tricuspid valve is grossly normal. Tricuspid valve regurgitation is mild. Aortic Valve: The aortic valve is calcified. Aortic valve regurgitation is not visualized. Mild aortic stenosis is  present. Aortic valve mean gradient measures 6.5 mmHg. Aortic valve peak gradient measures 12.7 mmHg. Aortic valve area, by VTI measures 1.32 cm. Pulmonic Valve: The pulmonic valve was not well visualized. Pulmonic valve regurgitation is not visualized. Aorta: The aortic root is normal in size and structure. Venous: The inferior vena cava is dilated in size with less than 50% respiratory variability, suggesting right atrial pressure of 15 mmHg. IAS/Shunts: No atrial level shunt detected by color flow Doppler. LEFT VENTRICLE PLAX 2D                        Biplane EF (MOD) LVOT diam:     1.80 cm         LV Biplane EF:   Left LV SV:         48                               ventricular LV SV Index:   20                               ejection LVOT Area:     2.54 cm                         fraction by  2D MOD                                                 biplane is LV Volumes (MOD)                                42.7 %. LV vol d, MOD    161.0 ml A2C: LV vol d, MOD    194.0 ml A4C: LV vol s, MOD    92.8 ml A2C: LV vol s, MOD    113.0 ml A4C: LV SV MOD A2C:   68.2 ml LV SV MOD A4C:   194.0 ml LV SV MOD BP:    77.9 ml AORTIC VALVE AV Area (Vmax):    1.58 cm AV Area (Vmean):   1.63 cm AV Area (VTI):     1.32 cm AV Vmax:           178.50 cm/s AV Vmean:          119.000 cm/s AV VTI:            0.366 m AV Peak Grad:      12.7 mmHg AV Mean Grad:      6.5 mmHg LVOT Vmax:         111.00 cm/s LVOT Vmean:        76.400 cm/s LVOT VTI:          0.190 m LVOT/AV VTI ratio: 0.52  AORTA Ao Root diam: 2.80 cm TRICUSPID VALVE TR Peak grad:   28.1 mmHg TR Vmax:        265.00 cm/s  SHUNTS Systemic VTI:  0.19 m Systemic Diam: 1.80 cm Adrian Prows MD Electronically signed by Adrian Prows MD Signature Date/Time: 10/09/2020/1:14:46 PM    Final     Microbiology: Recent Results (from the past 240 hour(s))  SARS CORONAVIRUS 2 (TAT 6-24 HRS) Nasopharyngeal Nasopharyngeal Swab     Status: Abnormal    Collection Time: 10/07/20 12:00 AM   Specimen: Nasopharyngeal Swab  Result Value Ref Range Status   SARS Coronavirus 2 POSITIVE (A) NEGATIVE Final    Comment: (NOTE) SARS-CoV-2 target nucleic acids are DETECTED.  The SARS-CoV-2 RNA is generally detectable in upper and lower respiratory specimens during the acute phase of infection. Positive results are indicative of the presence of SARS-CoV-2 RNA. Clinical correlation with patient history and other diagnostic information is  necessary to determine patient infection status. Positive results do not rule out bacterial infection or co-infection with other viruses.  The expected result is Negative.  Fact Sheet for Patients: SugarRoll.be  Fact Sheet for Healthcare Providers: https://www.woods-mathews.com/  This test is not yet approved or cleared by the [place] FDA and  has been authorized for detection and/or diagnosis of SARS-CoV-2 by FDA under an Emergency Use Authorization (EUA). This EUA will remain  in effect (meaning this test can be used) for the duration of the COVID-19 declaration under Section 564(b)(1) of the Act, 21 U. S.C. section 360bbb-3(b)(1), unless the authorization is terminated or revoked sooner.   Performed at [facility], [place] [address] [place], [place] [address]   Culture, blood (routine x 2)     Status: None   Collection Time: 10/07/20  1:32 AM   Specimen: BLOOD  Result Value Ref Range Status  Specimen Description   Final    BLOOD LEFT ANTECUBITAL Performed at [facility] [address], [place], [place] [address]    Special Requests   Final    BOTTLES DRAWN AEROBIC ONLY Blood Culture results may not be optimal due to an inadequate volume of blood received in culture bottles Performed at [facility] [address], [place], [place] [address]    Culture   Final    NO GROWTH 5 DAYS Performed at [facility], [place] [address], [place], [place] [address]    Report Status 10/12/2020 FINAL  Final  Culture, blood (routine x 2)     Status: None   Collection Time: 10/07/20  1:33 AM   Specimen: BLOOD  Result Value Ref Range Status   Specimen Description   Final    BLOOD RIGHT WRIST Performed at [facility] [address], [place], [place] [address]    Special Requests   Final    BOTTLES DRAWN AEROBIC AND ANAEROBIC Blood Culture results may not be optimal due to an inadequate volume of blood received in culture bottles Performed at [facility] [address], [place], [place] [address]    Culture   Final    NO GROWTH 5 DAYS Performed at [facility], [place] [address] [place],  [address]    Report Status 10/12/2020 FINAL  Final  MRSA PCR Screening     Status: None   Collection Time: 10/09/20  5:30 PM   Specimen: Nasopharyngeal  Result Value Ref Range Status   MRSA by PCR NEGATIVE NEGATIVE Final    Comment:        The GeneXpert MRSA Assay (FDA approved for NASAL specimens only), is one component of a comprehensive MRSA colonization surveillance program. It is not intended to diagnose MRSA infection nor to guide or monitor treatment for MRSA infections. Performed at [facility], [place] [address], [place],  [address]      Labs: Basic Metabolic Panel: Recent Labs  Lab 10/08/20 0445 10/09/20 0337 10/10/20 0424 10/11/20 0426 10/12/20 0427 10/13/20 0419  NA 135 136 133* 137 139 137  K 3.6 3.7 4.3 4.3 5.0 5.0  CL 95* 94* 94* 97* 95* 96*  CO2 28 28 28 31  35* 33*  GLUCOSE 151* 165* 168* 165* 99 88  BUN 26* 30* 40* 45* 43* 43*  CREATININE 1.30* 1.25* 1.26* 1.18* 1.00 0.98  CALCIUM 8.7* 8.6* 8.6* 8.7* 8.7* 9.2  MG 2.4 2.4 2.4 2.5* 2.6* 2.3  PHOS 3.7 3.2 2.5 1.9*  --   --    Liver Function Tests: Recent Labs  Lab 10/09/20 0337 10/10/20 0424 10/11/20 0426 10/12/20 0427 10/13/20 0419  AST 14*  15 31 35 24  ALT 11 11 18 25 22   ALKPHOS 54 60 81 65 66  BILITOT 0.5 0.6 0.6 0.7 0.7  PROT 6.0* 6.2* 6.3* 6.3* 6.4*  ALBUMIN 3.1* 3.2* 3.3* 3.3* 3.2*   No results for input(s): LIPASE, AMYLASE in the last 168 hours. No results for input(s): AMMONIA in the last 168 hours. CBC: Recent Labs  Lab 10/08/20 0445 10/09/20 0337 10/10/20 0424 10/11/20 0426 10/12/20 0427  WBC 4.8 8.8 9.3 8.2 8.1  NEUTROABS 4.3 7.9* 8.6* 7.1 6.5  HGB 12.2 11.8* 11.5* 11.8* 12.5  HCT 40.2 38.1 38.5 39.4 42.3  MCV 84.8 83.4 85.2 85.1 86.2  PLT 392 295 334 286 283   Cardiac Enzymes: No results  for input(s): CKTOTAL, CKMB, CKMBINDEX, TROPONINI in the last 168 hours. BNP: BNP (last 3 results) Recent Labs    10/07/20 0000  BNP 611.0*    ProBNP (last 3 results) No results for input(s): PROBNP in the last 8760 hours.  CBG: Recent Labs  Lab 10/12/20 0744 10/12/20 1141 10/12/20 1646 10/12/20 2007 10/13/20 0740  GLUCAP 71 130* 146* 218* 129*       Signed:  Nita Sells MD   [facility] 10/13/2020, 11:27 AM

## 2020-10-17 ENCOUNTER — Telehealth: Payer: Self-pay

## 2020-10-17 NOTE — Telephone Encounter (Signed)
No dig levels needed, yes can wrap them JG

## 2020-10-17 NOTE — Telephone Encounter (Signed)
A nurse from [facility] names Lorraine Harris called and stated that the pt has fluid build up and wounds on legs. She would like to know if she can wrap them. She would also like to know if we need her Digoxin Levels? She said that she is not sure if she can get one due to the fact that she is by herself. Dianes phone number is [phone] if needed. If she does not pick up she would like for [place] to leave her a message.

## 2020-10-18 NOTE — Telephone Encounter (Signed)
Called pt, no answer. Left vm

## 2020-10-19 ENCOUNTER — Telehealth: Payer: Self-pay

## 2020-10-19 NOTE — Telephone Encounter (Signed)
An occupational therapist named Milta Deiters called regarding Mrs. Marvel Plan. He is with [facility]. He requested an order for a bariatric Bed side commode and a bariatric hospital bed. His number is [phone] if we have any questions.

## 2020-10-19 NOTE — Telephone Encounter (Signed)
Can they call PCP please JG

## 2020-10-20 ENCOUNTER — Telehealth: Payer: Self-pay

## 2020-10-20 NOTE — Telephone Encounter (Signed)
bariatric Bed side commode and a bariatric hospital bed.

## 2020-10-20 NOTE — Telephone Encounter (Signed)
Location of hospitalization: [facility] Reason for hospitalization: Blood clot, leg swelling Date of discharge: 10/13/2020 Date of first communication with patient: today Person contacting patient: Lorraine Harris  Current symptoms:  Do you understand why you were in the Hospital: Yes Questions regarding discharge instructions: None Where were you discharged to: Home Medications reviewed: Yes Allergies reviewed: Yes Dietary changes reviewed: Yes. Discussed low fat and low salt diet.  Referals reviewed: NA Activities of Daily Living: Able to with mild limitations Any transportation issues/concerns: None Any patient concerns: None Confirmed importance & date/time of Follow up appt: Yes Confirmed with patient if condition begins to worsen call. Pt was given the office number and encouraged to call back with questions or concerns: Yes

## 2020-10-20 NOTE — Telephone Encounter (Signed)
Pt does not have a PCP, They have checked with her and she only sees you. She goes to urgent care for everything else.

## 2020-10-20 NOTE — Telephone Encounter (Signed)
Okay they can have it. Send me the request what exactly she needs

## 2020-10-21 ENCOUNTER — Other Ambulatory Visit: Payer: Self-pay | Admitting: Cardiology

## 2020-10-21 DIAGNOSIS — I420 Dilated cardiomyopathy: Secondary | ICD-10-CM

## 2020-10-21 DIAGNOSIS — I5042 Chronic combined systolic (congestive) and diastolic (congestive) heart failure: Secondary | ICD-10-CM

## 2020-10-21 NOTE — Telephone Encounter (Signed)
I have placed the order in chart and we have to fax them to home agency.

## 2020-10-21 NOTE — Progress Notes (Signed)
Received call from home health, patient has not been able to mobilize in view of morbid obesity and congestive heart failure and COPD and recent hospitalization.  bariatric Bed side commode and a bariatric hospital bed needed.  Placed orders.    ICD-10-CM   1. Dilated cardiomyopathy ([place])  I42.0 DME Bedside commode    For home use only DME Hospital bed  2. Chronic combined systolic and diastolic heart failure (HCC)  I50.42 DME Bedside commode    For home use only DME Hospital bed  3. Morbid obesity ([place])  E66.01 DME Bedside commode    For home use only DME Hospital bed   Orders Placed This Encounter  Procedures  . DME Bedside commode    Bariatric bedside commode    Order Specific Question:   Patient needs a bedside commode to treat with the following condition    Answer:   Musculoskeletal immobility [[id number]]  . For home use only DME Hospital bed    Order Specific Question:   Length of Need    Answer:   6 Months    Order Specific Question:   The above medical condition requires:    Answer:   Patient requires the ability to reposition frequently    Order Specific Question:   Head must be elevated greater than:    Answer:   30 degrees    Order Specific Question:   Bed type    Answer:   Heavy-duty, semi-electric (for patients >350 lbs.)    Order Specific Question:   Support Surface:    Answer:   Gel Charlyne Quale, MD, [facility] 10/21/2020, 9:47 AM Office: [phone] Pager: [phone]

## 2020-10-23 ENCOUNTER — Encounter: Payer: Self-pay | Admitting: Cardiology

## 2020-10-23 ENCOUNTER — Ambulatory Visit: Payer: Medicare HMO | Admitting: Cardiology

## 2020-10-23 ENCOUNTER — Other Ambulatory Visit: Payer: Self-pay

## 2020-10-23 VITALS — BP 84/63 | HR 42 | Temp 98.2°F | Resp 12 | Ht 64.0 in | Wt 317.0 lb

## 2020-10-23 DIAGNOSIS — I5042 Chronic combined systolic (congestive) and diastolic (congestive) heart failure: Secondary | ICD-10-CM

## 2020-10-23 DIAGNOSIS — I2781 Cor pulmonale (chronic): Secondary | ICD-10-CM

## 2020-10-23 DIAGNOSIS — I447 Left bundle-branch block, unspecified: Secondary | ICD-10-CM

## 2020-10-23 DIAGNOSIS — I480 Paroxysmal atrial fibrillation: Secondary | ICD-10-CM

## 2020-10-23 MED ORDER — AMIODARONE HCL 200 MG PO TABS: 200.0000 mg | ORAL_TABLET | Freq: Every day | ORAL | 2 refills | Status: AC

## 2020-10-23 MED ORDER — METOPROLOL TARTRATE 75 MG PO TABS: 75.0000 mg | ORAL_TABLET | Freq: Two times a day (BID) | ORAL | 1 refills | Status: AC

## 2020-10-23 NOTE — Progress Notes (Signed)
Primary Physician/Referring:  Everardo Beals, NP  Patient ID: Lorraine Harris, female    DOB: February 26, 1951, 70 y.o.   MRN: [id number]  No chief complaint on file.    HPI: [name]  is a 70 y.o. female morbid obesity, H/O non ischemic cardiomyopathy with normal coronary arteries by angio on 09/20/2014, EF 40%, LBBB,  HTN, bronchial asthma, and depression. PAF found in August 2020 after presenting with symptoms of angina, mildly abnormal nuclear stress test in 2020. She has chronic shortness breath and dyspnea on exertion due to obesity and also prior history of tobacco use which she has quit. She has been unable to tolerate Entresto due to soft blood pressure and is tolerating metoprolol.   She was admitted to the hospital on 10/06/2020 and discharged a week later with A. fib with RVR and acute decompensated heart failure and cellulitis of her leg.  She also had Covid pneumonia. This is also her 6 month OV.  Patient fortunately has recuperated well but essentially has been wheelchair-bound and has been using a walker to get up to the bathroom.  She has not had any fall.  She has not been able to cook and her son brings in food from outside sources.  Continues to have significant leg edema and home health has wrapped her leg recently. She is tolerating Xarelto without any bleeding diathesis.  Past Medical History:  Diagnosis Date  . A-fib ([place])   . A-fib ([place])   . Asthma   . CHF (congestive heart failure) ([place])   . Chronic combined systolic and diastolic heart failure ([place]) 12/17/2018  . Hyperlipidemia   . Hypertension     Past Surgical History:  Procedure Laterality Date  . LEFT HEART CATHETERIZATION WITH CORONARY ANGIOGRAM N/A 09/20/2014   Procedure: LEFT HEART CATHETERIZATION WITH CORONARY ANGIOGRAM;  Surgeon: Laverda Page, MD;  Location: [facility] CATH LAB;  Service: Cardiovascular;  Laterality: N/A;   Social History   Tobacco Use  . Smoking status: Former Smoker     Packs/day: 1.00    Years: 30.00    Pack years: 30.00    Quit date: 2012    Years since quitting: 10.1  . Smokeless tobacco: Never Used  Substance Use Topics  . Alcohol use: No   Marital Status: Divorced   Review of Systems  Cardiovascular: Positive for dyspnea on exertion and leg swelling. Negative for chest pain.  Musculoskeletal: Positive for arthritis and back pain.  Gastrointestinal: Negative for melena.  Psychiatric/Behavioral: Positive for depression. The patient has insomnia.    Objective  Blood pressure (!) 84/63, pulse (!) 42, temperature 98.2 F (36.8 C), temperature source Temporal, resp. rate 12, height _0  (1.626 m), weight (!) 317 lb (143.8 kg), SpO2 97 %. Body mass index is 54.41 kg/m.   Vitals with BMI 10/23/2020 10/13/2020 10/13/2020  Height _1  - -  Weight 317 lbs - -  BMI 63.33 - -  Systolic 84 545 625  Diastolic 63 98 89  Pulse 42 - 80      Physical Exam Constitutional:      General: She is not in acute distress.    Comments: Moderately built and morbidly obese  Neck:     Comments: Short neck and difficult to evaluate JVP Cardiovascular:     Rate and Rhythm: Normal rate and regular rhythm.     Pulses: Intact distal pulses.          Carotid pulses are 2+ on the right  side with bruit and 2+ on the left side with bruit.      Dorsalis pedis pulses are 2+ on the right side and 2+ on the left side.       Posterior tibial pulses are 2+ on the right side and 2+ on the left side.     Heart sounds: Murmur heard.   Harsh midsystolic murmur is present with a grade of 3/6 at the upper right sternal border radiating to the neck. No gallop.      Comments: Femoral and popliteal pulse difficult to feel due to patient's body habitus.  2-3+ bilateral pitting leg edema. No JVD. Pulmonary:     Effort: Pulmonary effort is normal. No respiratory distress.     Breath sounds: Wheezing (scattered occasional) present.  Abdominal:     General: Bowel sounds are normal.      Palpations: Abdomen is soft.     Comments: Obese. Pannus present    Laboratory examination:    CMP Latest Ref Rng & Units 10/13/2020 10/12/2020 10/11/2020  Glucose 70 - 99 mg/dL 88 99 165(H)  BUN 8 - 23 mg/dL 43(H) 43(H) 45(H)  Creatinine 0.44 - 1.00 mg/dL 0.98 1.00 1.18(H)  Sodium 135 - 145 mmol/L 137 139 137  Potassium 3.5 - 5.1 mmol/L 5.0 5.0 4.3  Chloride 98 - 111 mmol/L 96(L) 95(L) 97(L)  CO2 22 - 32 mmol/L 33(H) 35(H) 31  Calcium 8.9 - 10.3 mg/dL 9.2 8.7(L) 8.7(L)  Total Protein 6.5 - 8.1 g/dL 6.4(L) 6.3(L) 6.3(L)  Total Bilirubin 0.3 - 1.2 mg/dL 0.7 0.7 0.6  Alkaline Phos 38 - 126 U/L 66 65 81  AST 15 - 41 U/L 24 35 31  ALT 0 - 44 U/L _0 CBC Latest Ref Rng & Units 10/12/2020 10/11/2020 10/10/2020  WBC 4.0 - 10.5 K/uL 8.1 8.2 9.3  Hemoglobin 12.0 - 15.0 g/dL 12.5 11.8(L) 11.5(L)  Hematocrit 36.0 - 46.0 % 42.3 39.4 38.5  Platelets 150 - 400 K/uL 283 286 334   Lipid Panel     Component Value Date/Time   CHOL 126 03/08/2019 1052   TRIG 105 03/08/2019 1052   HDL 40 03/08/2019 1052   LDLCALC 65 03/08/2019 1052   HEMOGLOBIN A1C No results found for: HGBA1C, MPG TSH Recent Labs    10/07/20 0132  TSH 1.635   Medications   Current Outpatient Medications on File Prior to Visit  Medication Sig Dispense Refill  . albuterol (PROVENTIL HFA;VENTOLIN HFA) 108 (90 BASE) MCG/ACT inhaler Inhale 1 puff into the lungs every 6 (six) hours as needed for wheezing or shortness of breath.    . ALPRAZolam (XANAX) 0.5 MG tablet Take 0.5 mg by mouth 3 (three) times daily as needed for anxiety.     Marland Kitchen atorvastatin (LIPITOR) 10 MG tablet TAKE 1 TABLET BY MOUTH EVERY DAY 90 tablet 1  . buPROPion (WELLBUTRIN XL) 150 MG 24 hr tablet Take 150 mg by mouth daily.    . digoxin (LANOXIN) 0.125 MG tablet Take 1 tablet (0.125 mg total) by mouth daily. 30 tablet 1  . diphenhydrAMINE (BENADRYL) 25 MG tablet Take 25 mg by mouth every 6 (six) hours as needed for allergies.    . furosemide (LASIX) 40 MG  tablet TAKE 1 TABLET BY MOUTH EVERY DAY 30 tablet 6  . lansoprazole (PREVACID) 15 MG capsule Take 15 mg by mouth as needed.    . metFORMIN (GLUCOPHAGE) 500 MG tablet Take 1 tablet (500 mg total) by mouth 2 (two)  times daily with a meal. 60 tablet 2  . venlafaxine (EFFEXOR) 75 MG tablet Take 75 mg by mouth daily.    Alveda Reasons 15 MG TABS tablet TAKE 1 TABLET (15 MG TOTAL) BY MOUTH DAILY WITH SUPPER. 30 tablet 6   No current facility-administered medications on file prior to visit.    Radiology: CT angiogram chest 10/06/2020: 1. No CT evidence of central pulmonary artery embolus. 2. Cardiovascular: There is mild cardiomegaly. No pericardial effusion. Coronary vascular calcification and calcification of the mitral annulus. There is retrograde flow of contrast from the right atrium into the IVC suggestive of a degree of right heart dysfunction. There is mild atherosclerotic calcification of the thoracic aorta. The aorta is tortuous. Mild dilatation of the main pulmonary trunk suggestive of pulmonary hypertension. 3. Diffuse bilateral streaky and interstitial densities may represent atelectasis or atypical infection. Clinical correlation is recommended. 4. Aortic Atherosclerosis (ICD10-I70.0).  Cardiac Studies:   Coronary angiogram 09/20/2014: No significant coronary artery disease. Left dominant circulation. Ascending aortogram normal. Anterior origin of a small RCA. LV not evaluated due to inability to cross AV  Lexiscan Myoview stress test 06/07/2019: Lexiscan stress test was with 2-day protocol was performed. Stress EKG is non-diagnostic, as this is pharmacological stress test. Rest and stress EKG reveal sinus rhythm and left bundle branch block.Tissue attenuation seen in both rest and stress images, owing to large body habitus (313 lb). SPECT images reveal small area of mild intensity perfusion defect in inferior/inferolateral myocardium. Decreased global myocardial wall thickening and wall  motion. Stress LVEF 27%. TID is 1.21. High risk study.   Echocardiogram 10/09/2020:  1. Left ventricular ejection fraction, by estimation, is 35 to 40%. Left ventricular ejection fraction by 2D MOD biplane is 42.7 %. The left ventricle has mild to moderately decreased function. The left ventricle demonstrates global hypokinesis. Left  ventricular diastolic parameters are indeterminate.  2. Right ventricular systolic function was not well visualized. There is moderately elevated pulmonary artery systolic pressure. The estimated right ventricular systolic pressure is 78.9 mmHg.  3. Left atrial size was moderately dilated.  4. The mitral valve is grossly normal. Trivial mitral valve regurgitation.  5. The aortic valve is calcified. Aortic valve regurgitation is not visualized. Mild aortic valve stenosis.  6. The inferior vena cava is dilated in size with <50% respiratory variability, suggesting right atrial pressure of 15 mmHg.  Comparison(s): Compared to 04/22/2019, no significant change in EF 35-40%.  Lower extremity venous duplex 10/08/2020: BILATERAL: - No evidence of deep vein thrombosis seen in the lower extremities, bilaterally. -No evidence of popliteal cyst, bilaterally.  EKG:  EKG 10/23/2020: Atrial fibrillation with rapid ventricular response of rate of 1 119 bpm, left bundle branch block.  No further analysis.   EKG 10/20/2019: Probable Sinus bradycardia at 58 bpm with 1 PAC, normal axis, LBBB, no further analysis due to LBBB  EKG 03/05/2019 in ER: A fib with RVR at 138 bpm, LBBB  Assessment     ICD-10-CM   1. Paroxysmal atrial fibrillation ([place]). CHA2DS2-VASc Score is 4.  Yearly risk of stroke: 4% (A, F, HTN, CHF). .  I48.0 EKG 12-Lead    Metoprolol Tartrate 75 MG TABS    amiodarone (PACERONE) 200 MG tablet    CMP14+EGFR    CBC    Digoxin level  2. LBBB (left bundle branch block)  I44.7   3. Cor pulmonale, chronic (HCC)  I27.81   4. Chronic combined systolic and diastolic  heart failure (HCC)  I50.42  5. Morbid obesity ([place])  E66.01    Meds ordered this encounter  Medications  . Metoprolol Tartrate 75 MG TABS    Sig: Take 75 mg by mouth in the morning and at bedtime.    Dispense:  180 tablet    Refill:  1  . amiodarone (PACERONE) 200 MG tablet    Sig: Take 1 tablet (200 mg total) by mouth daily.    Dispense:  30 tablet    Refill:  2   Medications Discontinued During This Encounter  Medication Reason  . predniSONE (DELTASONE) 50 MG tablet Error  . Metoprolol Tartrate 75 MG TABS Reorder      Orders Placed This Encounter  Procedures  . CMP14+EGFR  . CBC  . Digoxin level  . EKG 12-Lead   Recommendations:   ILYA ESS  is a 70 y.o. morbid obesity, H/O non ischemic cardiomyopathy with normal coronary arteries by angio on 09/20/2014, EF 40%, LBBB,  HTN, bronchial asthma, and depression. PAF found in August 2020 after presenting with symptoms of angina, mildly abnormal nuclear stress test in 2020. She has chronic shortness breath and dyspnea on exertion due to obesity and also prior history of tobacco use which she has quit. She has been unable to tolerate Entresto due to soft blood pressure and is tolerating metoprolol.   She was admitted to the hospital on 10/06/2020 and discharged a week later with A. fib with RVR and acute decompensated heart failure and cellulitis of her leg.  She also had Covid pneumonia. This is also her 6 month OV.  I reviewed her labs and also external records from the hospitalization.  Her blood pressure is soft, she is presently taking metoprolol tartrate 75 mg 4 times daily which I reduced it to twice daily.  I will add amiodarone 200 mg daily for heart rate control.  In spite of A. fib with RVR and LV systolic dysfunction, today do not suspect acute decompensated heart failure.  Her bilateral leg edema is related to recent immobility, dependent edema and also cellulitis of her leg which she was recently treated.  I have  advised her to keep her foot elevated, and not to use a recliner but to use either hospital bed which I have already ordered or sleep on a sofa or rest on a sofa with legs being flat.  Continue furosemide on a daily basis but for the next 1 week to use it twice daily.  Best option would have been for her to go to an extended care facility for few weeks but unfortunately was discharged home and has very little support from family.  She is accompanied by a friend who brought her here for the appointment.  I have discussed with her regarding being extremely strict with her diet.  Patient is doing her best, she is to wait about 360 pounds, she is now weighing 317 pounds over the past 6 to 8 months.  We will continue to support her in this regard.  I have been getting home health orders and requests which have complied.  I will see her back in 4 weeks for follow-up.  I have placed lab orders.  Previously she was scheduled for carotid artery duplex due to presence of a bruit, in view of her medical comorbidity, will hold off on this for now until she is more stable.  This was a 40-minute office visit encounter.   Adrian Prows, MD, [facility] 10/23/2020, 2:07 PM Office: [phone]

## 2020-10-24 NOTE — Telephone Encounter (Signed)
Faxed orders to home agency.

## 2020-10-30 ENCOUNTER — Other Ambulatory Visit: Payer: Self-pay

## 2020-10-31 ENCOUNTER — Other Ambulatory Visit: Payer: Self-pay

## 2020-10-31 ENCOUNTER — Inpatient Hospital Stay (HOSPITAL_COMMUNITY): Payer: Medicare HMO

## 2020-10-31 ENCOUNTER — Encounter (HOSPITAL_COMMUNITY): Admission: EM | Disposition: E | Payer: Self-pay | Source: Home / Self Care | Attending: Internal Medicine

## 2020-10-31 ENCOUNTER — Emergency Department (HOSPITAL_COMMUNITY): Payer: Medicare HMO

## 2020-10-31 ENCOUNTER — Inpatient Hospital Stay (HOSPITAL_COMMUNITY): Payer: Medicare HMO | Admitting: Certified Registered Nurse Anesthetist

## 2020-10-31 ENCOUNTER — Inpatient Hospital Stay (HOSPITAL_COMMUNITY)
Admission: EM | Admit: 2020-10-31 | Discharge: 2020-12-01 | DRG: 308 | Disposition: E | Payer: Medicare HMO | Attending: Internal Medicine | Admitting: Internal Medicine

## 2020-10-31 ENCOUNTER — Encounter (HOSPITAL_COMMUNITY): Payer: Self-pay

## 2020-10-31 DIAGNOSIS — Z87891 Personal history of nicotine dependence: Secondary | ICD-10-CM

## 2020-10-31 DIAGNOSIS — Z9981 Dependence on supplemental oxygen: Secondary | ICD-10-CM

## 2020-10-31 DIAGNOSIS — Z6841 Body Mass Index (BMI) 40.0 and over, adult: Secondary | ICD-10-CM | POA: Diagnosis not present

## 2020-10-31 DIAGNOSIS — R600 Localized edema: Secondary | ICD-10-CM | POA: Diagnosis present

## 2020-10-31 DIAGNOSIS — Z86010 Personal history of colonic polyps: Secondary | ICD-10-CM

## 2020-10-31 DIAGNOSIS — U071 COVID-19: Secondary | ICD-10-CM | POA: Diagnosis present

## 2020-10-31 DIAGNOSIS — Z20822 Contact with and (suspected) exposure to covid-19: Secondary | ICD-10-CM | POA: Diagnosis present

## 2020-10-31 DIAGNOSIS — R52 Pain, unspecified: Secondary | ICD-10-CM

## 2020-10-31 DIAGNOSIS — J449 Chronic obstructive pulmonary disease, unspecified: Secondary | ICD-10-CM | POA: Diagnosis present

## 2020-10-31 DIAGNOSIS — W050XXA Fall from non-moving wheelchair, initial encounter: Secondary | ICD-10-CM | POA: Diagnosis present

## 2020-10-31 DIAGNOSIS — E782 Mixed hyperlipidemia: Secondary | ICD-10-CM | POA: Diagnosis present

## 2020-10-31 DIAGNOSIS — Z419 Encounter for procedure for purposes other than remedying health state, unspecified: Secondary | ICD-10-CM

## 2020-10-31 DIAGNOSIS — Z7984 Long term (current) use of oral hypoglycemic drugs: Secondary | ICD-10-CM

## 2020-10-31 DIAGNOSIS — N1832 Chronic kidney disease, stage 3b: Secondary | ICD-10-CM | POA: Diagnosis present

## 2020-10-31 DIAGNOSIS — I428 Other cardiomyopathies: Secondary | ICD-10-CM | POA: Diagnosis present

## 2020-10-31 DIAGNOSIS — I5043 Acute on chronic combined systolic (congestive) and diastolic (congestive) heart failure: Secondary | ICD-10-CM | POA: Diagnosis present

## 2020-10-31 DIAGNOSIS — I447 Left bundle-branch block, unspecified: Secondary | ICD-10-CM | POA: Diagnosis present

## 2020-10-31 DIAGNOSIS — I10 Essential (primary) hypertension: Secondary | ICD-10-CM | POA: Diagnosis present

## 2020-10-31 DIAGNOSIS — I130 Hypertensive heart and chronic kidney disease with heart failure and stage 1 through stage 4 chronic kidney disease, or unspecified chronic kidney disease: Secondary | ICD-10-CM | POA: Diagnosis present

## 2020-10-31 DIAGNOSIS — S43015A Anterior dislocation of left humerus, initial encounter: Secondary | ICD-10-CM | POA: Diagnosis present

## 2020-10-31 DIAGNOSIS — Z841 Family history of disorders of kidney and ureter: Secondary | ICD-10-CM

## 2020-10-31 DIAGNOSIS — W19XXXA Unspecified fall, initial encounter: Secondary | ICD-10-CM | POA: Diagnosis not present

## 2020-10-31 DIAGNOSIS — Z8616 Personal history of COVID-19: Secondary | ICD-10-CM

## 2020-10-31 DIAGNOSIS — I959 Hypotension, unspecified: Secondary | ICD-10-CM | POA: Diagnosis present

## 2020-10-31 DIAGNOSIS — I4819 Other persistent atrial fibrillation: Secondary | ICD-10-CM | POA: Diagnosis present

## 2020-10-31 DIAGNOSIS — I4901 Ventricular fibrillation: Secondary | ICD-10-CM | POA: Diagnosis not present

## 2020-10-31 DIAGNOSIS — J810 Acute pulmonary edema: Secondary | ICD-10-CM

## 2020-10-31 DIAGNOSIS — N179 Acute kidney failure, unspecified: Secondary | ICD-10-CM

## 2020-10-31 DIAGNOSIS — E8770 Fluid overload, unspecified: Secondary | ICD-10-CM | POA: Diagnosis present

## 2020-10-31 DIAGNOSIS — Z7401 Bed confinement status: Secondary | ICD-10-CM

## 2020-10-31 DIAGNOSIS — N289 Disorder of kidney and ureter, unspecified: Secondary | ICD-10-CM

## 2020-10-31 DIAGNOSIS — R778 Other specified abnormalities of plasma proteins: Secondary | ICD-10-CM

## 2020-10-31 DIAGNOSIS — Z8249 Family history of ischemic heart disease and other diseases of the circulatory system: Secondary | ICD-10-CM

## 2020-10-31 DIAGNOSIS — Z993 Dependence on wheelchair: Secondary | ICD-10-CM

## 2020-10-31 DIAGNOSIS — E662 Morbid (severe) obesity with alveolar hypoventilation: Secondary | ICD-10-CM | POA: Diagnosis present

## 2020-10-31 DIAGNOSIS — L03116 Cellulitis of left lower limb: Secondary | ICD-10-CM | POA: Diagnosis present

## 2020-10-31 DIAGNOSIS — I5023 Acute on chronic systolic (congestive) heart failure: Secondary | ICD-10-CM | POA: Diagnosis present

## 2020-10-31 DIAGNOSIS — Z79899 Other long term (current) drug therapy: Secondary | ICD-10-CM

## 2020-10-31 DIAGNOSIS — E871 Hypo-osmolality and hyponatremia: Secondary | ICD-10-CM | POA: Diagnosis present

## 2020-10-31 DIAGNOSIS — Y92009 Unspecified place in unspecified non-institutional (private) residence as the place of occurrence of the external cause: Secondary | ICD-10-CM

## 2020-10-31 DIAGNOSIS — Z7901 Long term (current) use of anticoagulants: Secondary | ICD-10-CM

## 2020-10-31 DIAGNOSIS — I462 Cardiac arrest due to underlying cardiac condition: Secondary | ICD-10-CM | POA: Diagnosis not present

## 2020-10-31 DIAGNOSIS — J9601 Acute respiratory failure with hypoxia: Secondary | ICD-10-CM | POA: Diagnosis not present

## 2020-10-31 DIAGNOSIS — F32A Depression, unspecified: Secondary | ICD-10-CM | POA: Diagnosis present

## 2020-10-31 DIAGNOSIS — I4891 Unspecified atrial fibrillation: Secondary | ICD-10-CM | POA: Diagnosis not present

## 2020-10-31 DIAGNOSIS — Z885 Allergy status to narcotic agent status: Secondary | ICD-10-CM

## 2020-10-31 DIAGNOSIS — Z91040 Latex allergy status: Secondary | ICD-10-CM

## 2020-10-31 DIAGNOSIS — L8990 Pressure ulcer of unspecified site, unspecified stage: Secondary | ICD-10-CM | POA: Insufficient documentation

## 2020-10-31 HISTORY — PX: SHOULDER CLOSED REDUCTION: SHX1051

## 2020-10-31 LAB — COMPREHENSIVE METABOLIC PANEL
ALT: 16 U/L (ref 0–44)
AST: 26 U/L (ref 15–41)
Albumin: 3 g/dL — ABNORMAL LOW (ref 3.5–5.0)
Alkaline Phosphatase: 60 U/L (ref 38–126)
Anion gap: 15 (ref 5–15)
BUN: 27 mg/dL — ABNORMAL HIGH (ref 8–23)
CO2: 27 mmol/L (ref 22–32)
Calcium: 8.2 mg/dL — ABNORMAL LOW (ref 8.9–10.3)
Chloride: 85 mmol/L — ABNORMAL LOW (ref 98–111)
Creatinine, Ser: 1.77 mg/dL — ABNORMAL HIGH (ref 0.44–1.00)
GFR, Estimated: 31 mL/min — ABNORMAL LOW (ref >60)
Glucose, Bld: 130 mg/dL — ABNORMAL HIGH (ref 70–99)
Potassium: 3.6 mmol/L (ref 3.5–5.1)
Sodium: 127 mmol/L — ABNORMAL LOW (ref 135–145)
Total Bilirubin: 1 mg/dL (ref 0.3–1.2)
Total Protein: 6.2 g/dL — ABNORMAL LOW (ref 6.5–8.1)

## 2020-10-31 LAB — BRAIN NATRIURETIC PEPTIDE: B Natriuretic Peptide: 935.5 pg/mL — ABNORMAL HIGH (ref 0.0–100.0)

## 2020-10-31 LAB — BLOOD GAS, ARTERIAL
Acid-Base Excess: 5.7 mmol/L — ABNORMAL HIGH (ref 0.0–2.0)
Bicarbonate: 32.4 mmol/L — ABNORMAL HIGH (ref 20.0–28.0)
Drawn by: 331471
FIO2: 10
O2 Saturation: 98.6 %
Patient temperature: 98.6
pCO2 arterial: 60.8 mmHg — ABNORMAL HIGH (ref 32.0–48.0)
pH, Arterial: 7.346 — ABNORMAL LOW (ref 7.350–7.450)
pO2, Arterial: 126 mmHg — ABNORMAL HIGH (ref 83.0–108.0)

## 2020-10-31 LAB — CBC WITH DIFFERENTIAL/PLATELET
Abs Immature Granulocytes: 0.03 10*3/uL (ref 0.00–0.07)
Basophils Absolute: 0 10*3/uL (ref 0.0–0.1)
Basophils Relative: 0 %
Eosinophils Absolute: 0 10*3/uL (ref 0.0–0.5)
Eosinophils Relative: 0 %
HCT: 35.8 % — ABNORMAL LOW (ref 36.0–46.0)
Hemoglobin: 11.1 g/dL — ABNORMAL LOW (ref 12.0–15.0)
Immature Granulocytes: 0 %
Lymphocytes Relative: 7 %
Lymphs Abs: 0.7 10*3/uL (ref 0.7–4.0)
MCH: 26.5 pg (ref 26.0–34.0)
MCHC: 31 g/dL (ref 30.0–36.0)
MCV: 85.4 fL (ref 80.0–100.0)
Monocytes Absolute: 0.8 10*3/uL (ref 0.1–1.0)
Monocytes Relative: 8 %
Neutro Abs: 8.7 10*3/uL — ABNORMAL HIGH (ref 1.7–7.7)
Neutrophils Relative %: 85 %
Platelets: 256 10*3/uL (ref 150–400)
RBC: 4.19 MIL/uL (ref 3.87–5.11)
RDW: 19.6 % — ABNORMAL HIGH (ref 11.5–15.5)
WBC: 10.3 10*3/uL (ref 4.0–10.5)
nRBC: 0 % (ref 0.0–0.2)

## 2020-10-31 LAB — TROPONIN I (HIGH SENSITIVITY)
Troponin I (High Sensitivity): 21 ng/L — ABNORMAL HIGH (ref <18)
Troponin I (High Sensitivity): 25 ng/L — ABNORMAL HIGH (ref <18)

## 2020-10-31 LAB — RESP PANEL BY RT-PCR (FLU A&B, COVID) ARPGX2
Influenza A by PCR: NEGATIVE
Influenza B by PCR: NEGATIVE
SARS Coronavirus 2 by RT PCR: POSITIVE — AB

## 2020-10-31 LAB — DIGOXIN LEVEL: Digoxin Level: 1 ng/mL (ref 0.8–2.0)

## 2020-10-31 LAB — MAGNESIUM: Magnesium: 1.5 mg/dL — ABNORMAL LOW (ref 1.7–2.4)

## 2020-10-31 LAB — MRSA PCR SCREENING: MRSA by PCR: NEGATIVE

## 2020-10-31 SURGERY — CLOSED REDUCTION, SHOULDER
Anesthesia: General | Site: Shoulder | Laterality: Left

## 2020-10-31 MED ORDER — HYDROMORPHONE HCL 1 MG/ML IJ SOLN
0.5000 mg | INTRAMUSCULAR | Status: DC | PRN
  Administered 2020-10-31 (×2): 0.5 mg via INTRAVENOUS
  Filled 2020-10-31 (×2): qty 1

## 2020-10-31 MED ORDER — POVIDONE-IODINE 10 % EX SWAB
2.0000 "application " | Freq: Once | CUTANEOUS | Status: AC
  Administered 2020-10-31: 2 via TOPICAL

## 2020-10-31 MED ORDER — BUPROPION HCL ER (XL) 150 MG PO TB24
150.0000 mg | ORAL_TABLET | Freq: Every day | ORAL | Status: DC
  Administered 2020-10-31: 150 mg via ORAL
  Filled 2020-10-31: qty 1

## 2020-10-31 MED ORDER — SODIUM CHLORIDE 0.9% FLUSH: 3.0000 mL | Freq: Two times a day (BID) | INTRAVENOUS | Status: DC

## 2020-10-31 MED ORDER — DILTIAZEM HCL 25 MG/5ML IV SOLN
20.0000 mg | Freq: Once | INTRAVENOUS | Status: AC
  Administered 2020-10-31: 20 mg via INTRAVENOUS
  Filled 2020-10-31: qty 5

## 2020-10-31 MED ORDER — CHLORHEXIDINE GLUCONATE 4 % EX LIQD
60.0000 mL | Freq: Once | CUTANEOUS | Status: DC
  Filled 2020-10-31: qty 60

## 2020-10-31 MED ORDER — FUROSEMIDE 10 MG/ML IJ SOLN
40.0000 mg | Freq: Once | INTRAMUSCULAR | Status: AC
  Administered 2020-10-31: 40 mg via INTRAVENOUS
  Filled 2020-10-31: qty 4

## 2020-10-31 MED ORDER — ACETAMINOPHEN 650 MG RE SUPP: 650.0000 mg | Freq: Four times a day (QID) | RECTAL | Status: DC | PRN

## 2020-10-31 MED ORDER — RIVAROXABAN 15 MG PO TABS: 15.0000 mg | ORAL_TABLET | Freq: Every day | ORAL | Status: DC

## 2020-10-31 MED ORDER — VENLAFAXINE HCL 75 MG PO TABS
75.0000 mg | ORAL_TABLET | Freq: Every day | ORAL | Status: DC
  Administered 2020-10-31: 75 mg via ORAL
  Filled 2020-10-31 (×2): qty 1

## 2020-10-31 MED ORDER — LIDOCAINE 5 % EX PTCH
1.0000 | MEDICATED_PATCH | CUTANEOUS | Status: DC
  Administered 2020-10-31 – 2020-11-01 (×2): 1 via TRANSDERMAL
  Filled 2020-10-31 (×2): qty 1

## 2020-10-31 MED ORDER — AMIODARONE IV BOLUS ONLY 150 MG/100ML
150.0000 mg | Freq: Once | INTRAVENOUS | Status: AC
  Administered 2020-10-31: 150 mg via INTRAVENOUS
  Filled 2020-10-31: qty 100

## 2020-10-31 MED ORDER — POLYETHYLENE GLYCOL 3350 17 G PO PACK: 17.0000 g | PACK | Freq: Every day | ORAL | Status: DC | PRN

## 2020-10-31 MED ORDER — IPRATROPIUM-ALBUTEROL 20-100 MCG/ACT IN AERS
1.0000 | INHALATION_SPRAY | Freq: Four times a day (QID) | RESPIRATORY_TRACT | Status: DC | PRN
  Filled 2020-10-31: qty 4

## 2020-10-31 MED ORDER — ALPRAZOLAM 0.5 MG PO TABS
0.5000 mg | ORAL_TABLET | Freq: Three times a day (TID) | ORAL | Status: DC | PRN
  Administered 2020-10-31 – 2020-11-01 (×2): 0.5 mg via ORAL
  Filled 2020-10-31 (×3): qty 1

## 2020-10-31 MED ORDER — FUROSEMIDE 10 MG/ML IJ SOLN
40.0000 mg | Freq: Two times a day (BID) | INTRAMUSCULAR | Status: DC
  Administered 2020-10-31 – 2020-11-01 (×3): 40 mg via INTRAVENOUS
  Filled 2020-10-31 (×2): qty 4

## 2020-10-31 MED ORDER — AMIODARONE LOAD VIA INFUSION
150.0000 mg | Freq: Once | INTRAVENOUS | Status: AC
  Administered 2020-10-31: 150 mg via INTRAVENOUS
  Filled 2020-10-31: qty 83.34

## 2020-10-31 MED ORDER — AMIODARONE HCL IN DEXTROSE 360-4.14 MG/200ML-% IV SOLN
30.0000 mg/h | INTRAVENOUS | Status: DC
  Administered 2020-10-31 (×3): 30 mg/h via INTRAVENOUS
  Filled 2020-10-31 (×3): qty 200

## 2020-10-31 MED ORDER — POTASSIUM CHLORIDE CRYS ER 20 MEQ PO TBCR
40.0000 meq | EXTENDED_RELEASE_TABLET | Freq: Once | ORAL | Status: AC
  Administered 2020-10-31: 40 meq via ORAL
  Filled 2020-10-31: qty 2

## 2020-10-31 MED ORDER — ACETAMINOPHEN 325 MG PO TABS
650.0000 mg | ORAL_TABLET | Freq: Four times a day (QID) | ORAL | Status: DC | PRN
  Administered 2020-10-31: 650 mg via ORAL
  Filled 2020-10-31: qty 2

## 2020-10-31 MED ORDER — ORAL CARE MOUTH RINSE: 15.0000 mL | Freq: Two times a day (BID) | OROMUCOSAL | Status: DC

## 2020-10-31 MED ORDER — ALBUTEROL SULFATE HFA 108 (90 BASE) MCG/ACT IN AERS: 1.0000 | INHALATION_SPRAY | Freq: Four times a day (QID) | RESPIRATORY_TRACT | Status: DC | PRN

## 2020-10-31 MED ORDER — DILTIAZEM LOAD VIA INFUSION: 20.0000 mg | Freq: Once | INTRAVENOUS | Status: DC

## 2020-10-31 MED ORDER — FENTANYL CITRATE (PF) 100 MCG/2ML IJ SOLN: 25.0000 ug | INTRAMUSCULAR | Status: DC | PRN

## 2020-10-31 MED ORDER — CHLORHEXIDINE GLUCONATE CLOTH 2 % EX PADS
6.0000 | MEDICATED_PAD | Freq: Every day | CUTANEOUS | Status: DC
  Administered 2020-10-31: 6 via TOPICAL

## 2020-10-31 MED ORDER — AMIODARONE HCL IN DEXTROSE 360-4.14 MG/200ML-% IV SOLN
60.0000 mg/h | INTRAVENOUS | Status: AC
  Administered 2020-10-31 (×2): 60 mg/h via INTRAVENOUS
  Filled 2020-10-31: qty 200

## 2020-10-31 MED ORDER — MAGNESIUM SULFATE 2 GM/50ML IV SOLN
1.0000 g | Freq: Once | INTRAVENOUS | Status: AC
  Administered 2020-10-31: 1 g via INTRAVENOUS
  Filled 2020-10-31: qty 50

## 2020-10-31 MED ORDER — SODIUM CHLORIDE 0.9 % IV SOLN
2.0000 g | INTRAVENOUS | Status: DC
  Administered 2020-10-31: 2 g via INTRAVENOUS
  Filled 2020-10-31 (×2): qty 20

## 2020-10-31 MED ORDER — DIGOXIN 125 MCG PO TABS
0.1250 mg | ORAL_TABLET | Freq: Every day | ORAL | Status: DC
  Administered 2020-10-31: 0.125 mg via ORAL
  Filled 2020-10-31: qty 1

## 2020-10-31 MED ORDER — ATORVASTATIN CALCIUM 10 MG PO TABS
10.0000 mg | ORAL_TABLET | Freq: Every day | ORAL | Status: DC
  Administered 2020-10-31: 10 mg via ORAL
  Filled 2020-10-31: qty 1

## 2020-10-31 MED ORDER — MAGNESIUM SULFATE 2 GM/50ML IV SOLN
2.0000 g | Freq: Once | INTRAVENOUS | Status: AC
  Administered 2020-10-31: 2 g via INTRAVENOUS
  Filled 2020-10-31: qty 50

## 2020-10-31 SURGICAL SUPPLY — 15 items
COVER SURGICAL LIGHT HANDLE (MISCELLANEOUS) ×2 IMPLANT
COVER WAND RF STERILE (DRAPES) IMPLANT
GLOVE SURG ENC MOIS LTX SZ7.5 (GLOVE) ×2 IMPLANT
GLOVE SURG LTX SZ8 (GLOVE) ×2 IMPLANT
GLOVE SURG ORTHO LTX SZ8 (GLOVE) ×2 IMPLANT
GOWN STRL REUS W/TWL LRG LVL3 (GOWN DISPOSABLE) ×2 IMPLANT
KIT TURNOVER KIT A (KITS) ×2 IMPLANT
NDL HYPO 25X1 1.5 SAFETY (NEEDLE) ×1 IMPLANT
NEEDLE HYPO 25X1 1.5 SAFETY (NEEDLE) ×2 IMPLANT
PENCIL SMOKE EVACUATOR (MISCELLANEOUS) IMPLANT
PROTECTOR NERVE ULNAR (MISCELLANEOUS) ×2 IMPLANT
SLING ARM IMMOBILIZER XL (CAST SUPPLIES) ×1 IMPLANT
SWABSTICK PVP 13S (MISCELLANEOUS) ×2 IMPLANT
SYR CONTROL 10ML LL (SYRINGE) ×2 IMPLANT
TOWEL OR 17X26 10 PK STRL BLUE (TOWEL DISPOSABLE) ×4 IMPLANT

## 2020-10-31 NOTE — Progress Notes (Signed)
HR sustaining 124-130's AFIB, BP 125/40. Hospitalist provider contacted V.O received to titrate up Amiodarone 60 mg/hr.  Will continue to monitor patient.

## 2020-10-31 NOTE — H&P (View-Only) (Signed)
Reason for Consult: Left shoulder pain and dislocation Referring Physician: hospitalist  Lorraine Harris is an 70 y.o. female.  HPI: 70 year old white female is being seen at the request of hospitalist for left shoulder pain and dislocation. Patient was very drowsy during my visit with her this afternoon but she states that she fell yesterday in her home. X-rays in the ED showed anterior inferior dislocation of the left humeral head. Patient also admitted for other multiple medical issues.  Past Medical History:  Diagnosis Date  . A-fib ([place])   . A-fib ([place])   . Asthma   . CHF (congestive heart failure) ([place])   . Chronic combined systolic and diastolic heart failure ([place]) 12/17/2018  . Hyperlipidemia   . Hypertension     Past Surgical History:  Procedure Laterality Date  . LEFT HEART CATHETERIZATION WITH CORONARY ANGIOGRAM N/A 09/20/2014   Procedure: LEFT HEART CATHETERIZATION WITH CORONARY ANGIOGRAM;  Surgeon: Laverda Page, MD;  Location: [facility] CATH LAB;  Service: Cardiovascular;  Laterality: N/A;    Family History  Problem Relation Age of Onset  . Heart disease Mother   . Kidney disease Father   . Heart attack Father   . Sleep apnea Father   . Heart disease Sister     Social History:  reports that she quit smoking about 10 years ago. She has a 30.00 pack-year smoking history. She has never used smokeless tobacco. She reports that she does not drink alcohol and does not use drugs.  Allergies:  Allergies  Allergen Reactions  . Morphine And Related Other (See Comments)    Seizure  . Valium [Diazepam] Other (See Comments)    "I can't walk"  . Latex Rash    Medications: I have reviewed the patient's current medications.  Results for orders placed or performed during the hospital encounter of 11/17/2020 (from the past 48 hour(s))  CBC with Differential/Platelet     Status: Abnormal   Collection Time: 11/05/2020  4:24 AM  Result Value Ref Range   WBC 10.3 4.0 - 10.5 K/uL    RBC 4.19 3.87 - 5.11 MIL/uL   Hemoglobin 11.1 (L) 12.0 - 15.0 g/dL   HCT 35.8 (L) 36.0 - 46.0 %   MCV 85.4 80.0 - 100.0 fL   MCH 26.5 26.0 - 34.0 pg   MCHC 31.0 30.0 - 36.0 g/dL   RDW 19.6 (H) 11.5 - 15.5 %   Platelets 256 150 - 400 K/uL   nRBC 0.0 0.0 - 0.2 %   Neutrophils Relative % 85 %   Neutro Abs 8.7 (H) 1.7 - 7.7 K/uL   Lymphocytes Relative 7 %   Lymphs Abs 0.7 0.7 - 4.0 K/uL   Monocytes Relative 8 %   Monocytes Absolute 0.8 0.1 - 1.0 K/uL   Eosinophils Relative 0 %   Eosinophils Absolute 0.0 0.0 - 0.5 K/uL   Basophils Relative 0 %   Basophils Absolute 0.0 0.0 - 0.1 K/uL   Immature Granulocytes 0 %   Abs Immature Granulocytes 0.03 0.00 - 0.07 K/uL    Comment: Performed at [facility], [place] [address], [place],  [address]  Comprehensive metabolic panel     Status: Abnormal   Collection Time: 11/17/2020  4:24 AM  Result Value Ref Range   Sodium 127 (L) 135 - 145 mmol/L   Potassium 3.6 3.5 - 5.1 mmol/L   Chloride 85 (L) 98 - 111 mmol/L   CO2 27 22 - 32 mmol/L   Glucose, Bld  130 (H) 70 - 99 mg/dL    Comment: Glucose reference range applies only to samples taken after fasting for at least 8 hours.   BUN 27 (H) 8 - 23 mg/dL   Creatinine, Ser 1.77 (H) 0.44 - 1.00 mg/dL   Calcium 8.2 (L) 8.9 - 10.3 mg/dL   Total Protein 6.2 (L) 6.5 - 8.1 g/dL   Albumin 3.0 (L) 3.5 - 5.0 g/dL   AST 26 15 - 41 U/L   ALT 16 0 - 44 U/L   Alkaline Phosphatase 60 38 - 126 U/L   Total Bilirubin 1.0 0.3 - 1.2 mg/dL   GFR, Estimated 31 (L) >60 mL/min    Comment: (NOTE) Calculated using the CKD-EPI Creatinine Equation (2021)    Anion gap 15 5 - 15    Comment: Performed at [facility], [place] [address] [place], [place] [address]  Troponin I (High Sensitivity)     Status: Abnormal   Collection Time: 11/04/2020  4:24 AM  Result Value Ref Range   Troponin I (High Sensitivity) 21 (H) <18 ng/L    Comment: (NOTE) Elevated high sensitivity troponin I (hsTnI)  values and significant  changes across serial measurements may suggest ACS but many other  chronic and acute conditions are known to elevate hsTnI results.  Refer to the "Links" section for chest pain algorithms and additional  guidance. Performed at [facility], [place] [address], [place], [place] [address]   Digoxin level     Status: None   Collection Time: 11/06/2020  4:24 AM  Result Value Ref Range   Digoxin Level 1.0 0.8 - 2.0 ng/mL    Comment: Performed at [facility], [place] [address], [place], [place] [address]  Magnesium     Status: Abnormal   Collection Time: 11/02/2020  4:24 AM  Result Value Ref Range   Magnesium 1.5 (L) 1.7 - 2.4 mg/dL    Comment: Performed at [facility], [place] [address], [place], [place] [address]  Brain natriuretic peptide     Status: Abnormal   Collection Time: 11/19/2020  4:26 AM  Result Value Ref Range   B Natriuretic Peptide 935.5 (H) 0.0 - 100.0 pg/mL    Comment: Performed at [facility], [place] [address], [place], [place] [address]  Troponin I (High Sensitivity)     Status: Abnormal   Collection Time: 11/24/2020  6:39 AM  Result Value Ref Range   Troponin I (High Sensitivity) 25 (H) <18 ng/L    Comment: (NOTE) Elevated high sensitivity troponin I (hsTnI) values and significant  changes across serial measurements may suggest ACS but many other  chronic and acute conditions are known to elevate hsTnI results.  Refer to the "Links" section for chest pain algorithms and additional  guidance. Performed at [facility], [place] [address], [place], [place] [address]   MRSA PCR Screening     Status: None   Collection Time: 11/10/2020  9:19 AM   Specimen: Nasal Mucosa; Nasopharyngeal  Result Value Ref Range   MRSA by PCR NEGATIVE NEGATIVE    Comment:        The GeneXpert MRSA Assay (FDA approved for NASAL specimens only), is one component of a comprehensive MRSA  colonization surveillance program. It is not intended to diagnose MRSA infection nor to guide or monitor treatment for MRSA infections. Performed at [facility], [place] [address], [place], [place] [address]     CT HEAD WO  CONTRAST  Result Date: 11/10/2020 CLINICAL DATA:  Right lower extremity weakness.  Headache.  Fatigue EXAM: CT HEAD WITHOUT CONTRAST TECHNIQUE: Contiguous axial images were obtained from the base of the skull through the vertex without intravenous contrast. COMPARISON:  None. FINDINGS: Brain: There is atrophy and chronic small vessel disease changes. No acute intracranial abnormality. Specifically, no hemorrhage, hydrocephalus, mass lesion, acute infarction, or significant intracranial injury. Vascular: No hyperdense vessel or unexpected calcification. Skull: No acute calvarial abnormality. Sinuses/Orbits: No acute abnormality. Other: None IMPRESSION: Atrophy, chronic microvascular disease. No acute intracranial abnormality. Electronically Signed   By: Rolm Baptise M.D.   On: 11/12/2020 08:47   DG Chest Portable 1 View  Result Date: 11/03/2020 CLINICAL DATA:  Dyspnea EXAM: PORTABLE CHEST 1 VIEW COMPARISON:  03/05/2019 FINDINGS: The lung bases are excluded on this lordotic, semi-erect radiograph. Despite this, perihilar and lower lung zone pulmonary infiltrates are present, most in keeping with mild to moderate cardiogenic failure. No pneumothorax. No definite pleural effusion. Cardiac size is enlarged, though not fully included on this examination. IMPRESSION: Mild to moderate cardiogenic failure. Electronically Signed   By: Fidela Salisbury MD   On: 11/13/2020 05:19   DG Shoulder Left Port  Result Date: 11/02/2020 CLINICAL DATA:  Recent fall with left shoulder pain, initial encounter EXAM: LEFT SHOULDER COMPARISON:  None. FINDINGS: Anterior inferior dislocation of the humeral head is noted with respect to the glenoid. Hill-Sachs deformity is noted within the  humeral head. No definitive fracture is seen. No soft tissue abnormality is noted. IMPRESSION: Anterior inferior dislocation of the left humeral head. Electronically Signed   By: Inez Catalina M.D.   On: 11/06/2020 09:12    Review of Systems  Reason unable to perform ROS: Patient very drowsy while I was in her room.  Constitutional: Positive for activity change.   Blood pressure (!) 110/93, pulse 100, temperature 97.6 F (36.4 C), temperature source Oral, resp. rate (!) 25, height 5\' 4"  (1.626 m), SpO2 95 %. Physical Exam Constitutional:      Appearance: She is obese.     Comments: Lethargic  HENT:     Head: Normocephalic and atraumatic.  Pulmonary:     Effort: No respiratory distress.  Musculoskeletal:     Comments: Left upper extremity she has good grip strength. Radial pulse intact.      Assessment/Plan: Left shoulder anterior inferior dislocation  Dr. Lorin Mercy will plan to take patient to the OR this evening for left shoulder closed reduction under anesthesia. I did attempt to discuss this with patient. Again likely secondary to pain medication that was recently given by RN. Dr. Lorin Mercy has reviewed x-rays.Benjiman Core 11/19/2020, 2:26 PM

## 2020-10-31 NOTE — Consult Note (Signed)
CARDIOLOGY CONSULT NOTE  Patient ID: Lorraine Harris MRN: [id number] DOB/AGE: [birth year] 71 y.o.  Admit date: 11/29/2020 Referring Physician  Marva Panda, MD Primary Physician:  Everardo Beals, NP Reason for Consultation  A. Fib with RVR  Patient ID: Lorraine Harris, female    DOB: [birth year], 70 y.o.   MRN: [id number]  Chief Complaint  Patient presents with  . Covid Positive  . Atrial Fibrillation   HPI:    Lorraine Harris  is a 70 y.o. Caucasian female patient with morbid obesity, H/O non ischemic cardiomyopathy with normal coronary arteries by angio on 09/20/2014, EF 40%, LBBB,  HTN, bronchial asthma, and depression. PAF found in August 2020 after presenting with symptoms of angina, mildly abnormal nuclear stress test in 2020. She has chronic shortness breath and dyspnea on exertion due to obesity and also prior history of tobacco use which she has quit. She has been unable to tolerate Entresto due to soft blood pressure and is tolerating metoprolol.   She was admitted to the hospital on 10/06/2020 and discharged a week later with A. fib with RVR and acute decompensated heart failure and cellulitis of her leg.  She also had Covid pneumonia.  I had seen her a week ago with significant leg edema, continued A. fib with RVR, but no significant dyspnea, I had anticipated that she was not in acute decompensated heart failure but generally food and volume overloaded due to essentially being wheelchair-bound and bedbound especially in the lower extremity.  I will increase the dose of furosemide to twice daily dosing as she responded well to p.o. Lasix.  Since hospital discharge she has had difficulty in getting along, difficulty with ambulation and gait.  She decided to come back to the emergency room but had a fall while trying to get on the wheelchair, since then has been having severe pain in her right shoulder as well.  I was consulted to manage her atrial fibrillation and possible  congestive heart failure.  Past Medical History:  Diagnosis Date  . A-fib ([place])   . A-fib ([place])   . Asthma   . CHF (congestive heart failure) ([place])   . Chronic combined systolic and diastolic heart failure ([place]) 12/17/2018  . Hyperlipidemia   . Hypertension    Past Surgical History:  Procedure Laterality Date  . LEFT HEART CATHETERIZATION WITH CORONARY ANGIOGRAM N/A 09/20/2014   Procedure: LEFT HEART CATHETERIZATION WITH CORONARY ANGIOGRAM;  Surgeon: Laverda Page, MD;  Location: [facility] CATH LAB;  Service: Cardiovascular;  Laterality: N/A;   Social History   Tobacco Use  . Smoking status: Former Smoker    Packs/day: 1.00    Years: 30.00    Pack years: 30.00    Quit date: 2012    Years since quitting: 10.1  . Smokeless tobacco: Never Used  Substance Use Topics  . Alcohol use: No    Family History  Problem Relation Age of Onset  . Heart disease Mother   . Kidney disease Father   . Heart attack Father   . Sleep apnea Father   . Heart disease Sister     Marital Sttus: Divorced  ROS  Review of Systems  Constitutional: Positive for malaise/fatigue.  Cardiovascular: Positive for dyspnea on exertion and leg swelling. Negative for chest pain.  Respiratory: Negative for cough and hemoptysis.   Musculoskeletal: Positive for arthritis, back pain and muscle weakness.  Gastrointestinal: Negative for melena.  Neurological: Positive for disturbances in coordination.  All other systems reviewed and  are negative.  Objective   Vitals with BMI 11/21/2020 11/19/2020 11/21/2020  Height - - -  Weight - - -  BMI - - -  Systolic 564 332 951  Diastolic 93 94 96  Pulse - - -    Blood pressure (!) 110/93, pulse 100, temperature 97.6 F (36.4 C), temperature source Oral, resp. rate (!) 25, height 5\' 4"  (1.626 m), SpO2 95 %.    Physical Exam Constitutional:      General: She is sleeping. She is not in acute distress.    Appearance: She is morbidly obese. She is ill-appearing.  HENT:      Head: Atraumatic.  Eyes:     Conjunctiva/sclera: Conjunctivae normal.  Cardiovascular:     Rate and Rhythm: Tachycardia present. Rhythm irregularly irregular.     Pulses: Normal pulses and intact distal pulses.          Carotid pulses are 2+ on the right side and 2+ on the left side.      Dorsalis pedis pulses are 2+ on the right side and 2+ on the left side.       Posterior tibial pulses are 2+ on the right side and 2+ on the left side.     Heart sounds: Murmur heard.   Harsh midsystolic murmur is present at the upper right sternal border radiating to the neck. No gallop. No S3 or S4 sounds.      Comments: S1 is variable, S2 is normal. No JVD, No leg edema. Pulmonary:     Breath sounds: Wheezing (bilateral diffuse) present.  Abdominal:     General: Bowel sounds are normal.     Palpations: Abdomen is soft.     Comments: Obese. Pannus present  Musculoskeletal:        General: Tenderness (right shoulder) present.     Cervical back: No tenderness.  Skin:    General: Skin is warm and dry.  Neurological:     General: No focal deficit present.     Mental Status: She is oriented to person, place, and time. She is lethargic.    Laboratory examination:   Recent Labs    10/12/20 0427 10/13/20 0419 11/24/2020 0424  NA 139 137 127*  K 5.0 5.0 3.6  CL 95* 96* 85*  CO2 35* 33* 27  GLUCOSE 99 88 130*  BUN 43* 43* 27*  CREATININE 1.00 0.98 1.77*  CALCIUM 8.7* 9.2 8.2*  GFRNONAA >60 >60 31*   estimated creatinine clearance is 42.2 mL/min (A) (by C-G formula based on SCr of 1.77 mg/dL (H)).  CMP Latest Ref Rng & Units 11/24/2020 10/13/2020 10/12/2020  Glucose 70 - 99 mg/dL 130(H) 88 99  BUN 8 - 23 mg/dL 27(H) 43(H) 43(H)  Creatinine 0.44 - 1.00 mg/dL 1.77(H) 0.98 1.00  Sodium 135 - 145 mmol/L 127(L) 137 139  Potassium 3.5 - 5.1 mmol/L 3.6 5.0 5.0  Chloride 98 - 111 mmol/L 85(L) 96(L) 95(L)  CO2 22 - 32 mmol/L 27 33(H) 35(H)  Calcium 8.9 - 10.3 mg/dL 8.2(L) 9.2 8.7(L)  Total Protein  6.5 - 8.1 g/dL 6.2(L) 6.4(L) 6.3(L)  Total Bilirubin 0.3 - 1.2 mg/dL 1.0 0.7 0.7  Alkaline Phos 38 - 126 U/L 60 66 65  AST 15 - 41 U/L 26 24 35  ALT 0 - 44 U/L 16 22 25    CBC Latest Ref Rng & Units 11/02/2020 10/12/2020 10/11/2020  WBC 4.0 - 10.5 K/uL 10.3 8.1 8.2  Hemoglobin 12.0 - 15.0 g/dL 11.1(L) 12.5 11.8(L)  Hematocrit 36.0 - 46.0 % 35.8(L) 42.3 39.4  Platelets 150 - 400 K/uL 256 283 286   Lipid Panel No results for input(s): CHOL, TRIG, LDLCALC, VLDL, HDL, CHOLHDL, LDLDIRECT in the last 8760 hours.  HEMOGLOBIN A1C No results found for: HGBA1C, MPG TSH Recent Labs    10/07/20 0132  TSH 1.635   BNP (last 3 results) Recent Labs    10/07/20 0000 11/26/2020 0426  BNP 611.0* 935.5*   Medications and allergies   Allergies  Allergen Reactions  . Morphine And Related Other (See Comments)    Seizure  . Valium [Diazepam] Other (See Comments)    "I can't walk"  . Latex Rash    No current facility-administered medications on file prior to encounter.   Current Outpatient Medications on File Prior to Encounter  Medication Sig Dispense Refill  . acetaminophen (TYLENOL) 325 MG tablet Take 650 mg by mouth every 6 (six) hours as needed for mild pain, fever or headache.    Marland Kitchen amiodarone (PACERONE) 200 MG tablet Take 1 tablet (200 mg total) by mouth daily. 30 tablet 2  . atorvastatin (LIPITOR) 10 MG tablet TAKE 1 TABLET BY MOUTH EVERY DAY (Patient taking differently: Take 10 mg by mouth daily.) 90 tablet 1  . buPROPion (WELLBUTRIN XL) 150 MG 24 hr tablet Take 150 mg by mouth daily.    . digoxin (LANOXIN) 0.125 MG tablet Take 1 tablet (0.125 mg total) by mouth daily. 30 tablet 1  . furosemide (LASIX) 40 MG tablet TAKE 1 TABLET BY MOUTH EVERY DAY (Patient taking differently: Take 40 mg by mouth daily.) 30 tablet 6  . Ipratropium-Albuterol (COMBIVENT RESPIMAT) 20-100 MCG/ACT AERS respimat Inhale 1 puff into the lungs every 6 (six) hours as needed for wheezing or shortness of breath.    .  metFORMIN (GLUCOPHAGE) 500 MG tablet Take 1 tablet (500 mg total) by mouth 2 (two) times daily with a meal. 60 tablet 2  . Metoprolol Tartrate 75 MG TABS Take 75 mg by mouth in the morning and at bedtime. (Patient taking differently: Take 75 mg by mouth 2 (two) times daily.) 180 tablet 1  . venlafaxine (EFFEXOR) 75 MG tablet Take 75 mg by mouth daily.    Alveda Reasons 15 MG TABS tablet TAKE 1 TABLET (15 MG TOTAL) BY MOUTH DAILY WITH SUPPER. (Patient taking differently: Take 15 mg by mouth daily with supper.) 30 tablet 6     Scheduled Meds: . atorvastatin  10 mg Oral Daily  . buPROPion  150 mg Oral Daily  . digoxin  0.125 mg Oral Daily  . furosemide  40 mg Intravenous BID  . lidocaine  1 patch Transdermal Q24H  . Rivaroxaban  15 mg Oral Q supper  . sodium chloride flush  3 mL Intravenous Q12H  . venlafaxine  75 mg Oral Daily   Continuous Infusions: . amiodarone 30 mg/hr (11/03/2020 1211)  . cefTRIAXone (ROCEPHIN)  IV Stopped (11/15/2020 1114)   PRN Meds:.acetaminophen **OR** acetaminophen, albuterol, ALPRAZolam, HYDROmorphone (DILAUDID) injection, polyethylene glycol   No intake/output data recorded. Total I/O In: 241.8 [I.V.:182.5; IV Piggyback:59.3] Out: -    Radiology:   CT HEAD WO CONTRAST  Result Date: 11/04/2020 CLINICAL DATA:  Right lower extremity weakness.  Headache.  Fatigue EXAM: CT HEAD WITHOUT CONTRAST TECHNIQUE: Contiguous axial images were obtained from the base of the skull through the vertex without intravenous contrast. COMPARISON:  None. FINDINGS: Brain: There is atrophy and chronic small vessel disease changes. No acute intracranial abnormality. Specifically, no hemorrhage,  hydrocephalus, mass lesion, acute infarction, or significant intracranial injury. Vascular: No hyperdense vessel or unexpected calcification. Skull: No acute calvarial abnormality. Sinuses/Orbits: No acute abnormality. Other: None IMPRESSION: Atrophy, chronic microvascular disease. No acute intracranial  abnormality. Electronically Signed   By: Rolm Baptise M.D.   On: 11/05/2020 08:47   DG Chest Portable 1 View  Result Date: 11/11/2020 CLINICAL DATA:  Dyspnea EXAM: PORTABLE CHEST 1 VIEW COMPARISON:  03/05/2019 FINDINGS: The lung bases are excluded on this lordotic, semi-erect radiograph. Despite this, perihilar and lower lung zone pulmonary infiltrates are present, most in keeping with mild to moderate cardiogenic failure. No pneumothorax. No definite pleural effusion. Cardiac size is enlarged, though not fully included on this examination. IMPRESSION: Mild to moderate cardiogenic failure. Electronically Signed   By: Fidela Salisbury MD   On: 11/21/2020 05:19   DG Shoulder Left Port  Result Date: 11/02/2020 CLINICAL DATA:  Recent fall with left shoulder pain, initial encounter EXAM: LEFT SHOULDER COMPARISON:  None. FINDINGS: Anterior inferior dislocation of the humeral head is noted with respect to the glenoid. Hill-Sachs deformity is noted within the humeral head. No definitive fracture is seen. No soft tissue abnormality is noted. IMPRESSION: Anterior inferior dislocation of the left humeral head. Electronically Signed   By: Inez Catalina M.D.   On: 11/25/2020 09:12    Cardiac Studies:   Coronary angiogram 09/20/2014:  No significant coronary artery disease. Left dominant circulation. Ascending aortogram normal. Anterior origin of a small RCA. LV not evaluated due to inability to cross AV  Lexiscan Myoview stress test 06/07/2019: Lexiscan stress test was with 2-day protocol was performed. Stress EKG is non-diagnostic, as this is pharmacological stress test. Rest and stress EKG reveal sinus rhythm and left bundle branch block.Tissue attenuation seen in both rest and stress images, owing to large body habitus (313 lb). SPECT images reveal small area of mild intensity perfusion defect in inferior/inferolateral myocardium. Decreased global myocardial wall thickening and wall motion. Stress LVEF 27%. TID is  1.21. High risk study.   Echocardiogram 10/09/2020:  1. Left ventricular ejection fraction, by estimation, is 35 to 40%. Left ventricular ejection fraction by 2D MOD biplane is 42.7 %. The left ventricle has mild to moderately decreased function. The left ventricle demonstrates global hypokinesis. Left  ventricular diastolic parameters are indeterminate.  2. Right ventricular systolic function was not well visualized. There is moderately elevated pulmonary artery systolic pressure. The estimated right ventricular systolic pressure is 89.2 mmHg.  3. Left atrial size was moderately dilated.  4. The mitral valve is grossly normal. Trivial mitral valve regurgitation.  5. The aortic valve is calcified. Aortic valve regurgitation is not visualized. Mild aortic valve stenosis.  6. The inferior vena cava is dilated in size with <50% respiratory variability, suggesting right atrial pressure of 15 mmHg.  Comparison(s): Compared to 04/22/2019, no significant change in EF 35-40%.  Lower extremity venous duplex 10/08/2020: BILATERAL: - No evidence of deep vein thrombosis seen in the lower extremities, bilaterally. -No evidence of popliteal cyst, bilaterally.  EKG:  EKG 11/04/2020: Atrial fibrillation with rapid ventricular response at the rate of 144 bpm, left axis deviation, left bundle branch block.  No further analysis.  Compared to EKG 10/23/2020: Atrial fibrillation with rapid ventricular response of rate of 1 119 bpm, left bundle branch block.  No further analysis.   Assessment   Lorraine Harris is a 70 y.o. Caucasian female patient with morbid obesity, H/O non ischemic cardiomyopathy with normal coronary arteries by angio on 09/20/2014,  EF 40%, LBBB,  HTN, bronchial asthma, and depression. PAF found in August 2020 after presenting with symptoms of angina, mildly abnormal nuclear stress test in 2020. She has chronic shortness breath and dyspnea on exertion due to obesity and also prior history  of tobacco use which she has quit. She has been unable to tolerate Entresto due to soft blood pressure and is tolerating metoprolol.   She was admitted to the hospital on 10/06/2020 and discharged a week later with A. fib with RVR and acute decompensated heart failure and cellulitis of her leg.  She also had Covid pneumonia.  1.  Persistent atrial fibrillation with rapid ventricular response 2.  Acute on chronic systolic heart failure 3.  Shortness of breath related to pulmonary edema and underlying obesity hypoventilation, underlying COPD and prior history of tobacco use 4.  Cellulitis bilateral lower extremity left appears worse 5.  Stage IIIb chronic kidney disease Recommendations:   We will start the patient on IV diltiazem for rate control of atrial fibrillation.  Blood pressure has been soft and unable to use any other agents, patient presently on digoxin.  Continue the same, we could also consider addition of low-dose beta-blockers if blood pressure tolerates, continue IV amiodarone for now. Suspect she has biventricular heart failure, right ventricle was not well visualized by recent echocardiogram.  Given her multiple medical comorbidity, advanced age, morbid obesity, stage IV chronic kidney disease, failure to thrive and lateral, she would be extremely high risk for recurrent hospital admission and also mortality and morbidity.  Agree with DNR status, patient's family, specifically her daughter-in-law had called our office to make her DNR and also arrange for transfer to a skilled nursing facility for at least a short time and consider palliative care.  I feel it is very appropriate to consider palliative care.  Continue gentle IV diuretics for now.   Adrian Prows, MD, [facility] 11/18/2020, 2:17 PM Office: [phone]

## 2020-10-31 NOTE — Progress Notes (Signed)
Orthopedic Tech Progress Note Patient Details:  [name] February 24, 1951 [id number] Patient already has arm sling applied. Patient ID: Lorraine Harris, female   DOB: July 16, 1951, 70 y.o.   MRN: [id number]   Braulio Bosch 11/27/2020, 8:05 PM

## 2020-10-31 NOTE — Progress Notes (Addendum)
Date and time results received: 11/17/2020 1439   Test:  SARS 2  Critical Value: (positive)  Name of Provider Notified: Lorin Mercy- Unable to reach. VM message left through triage.  Neysa Bonito- updated  Orders Received? Or Actions Taken?:

## 2020-10-31 NOTE — Anesthesia Postprocedure Evaluation (Signed)
Anesthesia Post Note  Patient: Lorraine Harris  Procedure(s) Performed: CLOSED REDUCTION SHOULDER (Left Shoulder)     Patient location during evaluation: SICU Anesthesia Type: General Level of consciousness: lethargic Pain management: pain level controlled Vital Signs Assessment: post-procedure vital signs reviewed and stable Respiratory status: spontaneous breathing and respiratory function unstable Cardiovascular status: stable Postop Assessment: no apparent nausea or vomiting Anesthetic complications: no Comments: Patient appeared to be on the verge of respiratory failure requiring intubation before procedure. Voiced that she did not want to be intubated. Anesthetic was performed with Sevoflurane only maintaining spontaneous respirations throughout. Patient taken to ICU directly after procedure. RT consulted and patient placed on bipap.  Hospitalist is following the patient in the ICU    No complications documented.  Last Vitals:  Vitals:   11/12/2020 1600 11/06/2020 1703  BP:    Pulse:    Resp:    Temp: 36.7 C   SpO2:  97%    Last Pain:  Vitals:   11/30/2020 1600  TempSrc: Oral  PainSc:                  [name] S

## 2020-10-31 NOTE — Progress Notes (Signed)
Patient was given Dilaudid IV and request son be contacted to complete consent. Sheria Lang contacted, procedure explained. Questions concerns denied. Verbal permission for procedure consent given for closed reduction left shoulder for shoulder dislocation.

## 2020-10-31 NOTE — Progress Notes (Addendum)
Call received from Orthopedic provider to request Covid swab completion prior to procedure this afternoon. Last Covid positive 10/07/20. Infection prevention RN Ester contacted. RN confirmed no isolation order and patient is out of 21 day window and result may remain positive for three months.

## 2020-10-31 NOTE — H&P (Addendum)
History and Physical        Hospital Admission Note Date: 11/04/2020  Patient name: Lorraine Harris Medical record number: [id number] Date of birth: 06/19/1951 Age: 70 y.o. Gender: female  PCP: Everardo Beals, NP  Patient coming from: home Lives with: self Ambulates: wheelchair   Chief Complaint    Chief Complaint  Patient presents with  . Covid Positive  . Atrial Fibrillation      HPI:   This is a 70 year old female with past medical history of NICM, chronic systolic heart failure, paroxysmal atrial fibrillation on Xarelto, hypertension, CKD 3a, LBBB, hypertension, hyperlipidemia, depression, morbid obesity, recent hospitalization for A. fib with RVR, decompensated CHF, cellulitis and COVID-19 infection from 2/5-2/11 and on chronic 2 L/min O2 since discharge who presented to the ED with complaints of fatigue, generalized and RLE weakness, headache and increased shortness of breath on exertion.  She says that she felt better for about 1 week after her recent discharge but then started feeling weak.  Also states that about 1 week ago she began having right lower extremity weakness causing her to shuffle her feet on ambulation and so she has been using her electric wheelchair.  She says that she tried to go to the restroom prior to coming to the ED and her wheelchair however it tipped over and she fell on her left arm and could not get up due to her weakness and so she contacted EMS.  Has also had persistent left lower extremity erythema since her recent hospitalization without fevers but has noticed weeping.  She adheres to her medication and diet regimen.  Has chronic orthopnea.  ED Course: Afebrile, hemodynamically stable, placed on 2 L/min (baseline since recent hospitalization). Notable Labs: Sodium 127, K3.6, chloride 85, BUN 27, creatinine 1.77, magnesium 1.5, BNP 935,  digoxin 1.0, Covid positive on 2/5. Notable Imaging: CXR-mild to moderate cardiogenic failure. Patient received Lasix 40 mg IV x1, amiodarone bolus with drip and magnesium sulfate 1 g.    Vitals:   11/11/2020 0723 11/14/2020 0723  BP: 108/83   Pulse:  100  Resp:    Temp:    SpO2:       Review of Systems:  Review of Systems  Respiratory: Negative for wheezing.   Cardiovascular: Negative for chest pain and palpitations.  Neurological: Negative for tingling and sensory change.  All other systems reviewed and are negative.   Medical/Social/Family History   Past Medical History: Past Medical History:  Diagnosis Date  . A-fib ([place])   . A-fib ([place])   . Asthma   . CHF (congestive heart failure) ([place])   . Chronic combined systolic and diastolic heart failure ([place]) 12/17/2018  . Hyperlipidemia   . Hypertension     Past Surgical History:  Procedure Laterality Date  . LEFT HEART CATHETERIZATION WITH CORONARY ANGIOGRAM N/A 09/20/2014   Procedure: LEFT HEART CATHETERIZATION WITH CORONARY ANGIOGRAM;  Surgeon: Laverda Page, MD;  Location: [facility] CATH LAB;  Service: Cardiovascular;  Laterality: N/A;    Medications: Prior to Admission medications   Medication Sig Start Date End Date Taking? Authorizing Provider  albuterol (PROVENTIL HFA;VENTOLIN HFA) 108 (90 BASE) MCG/ACT inhaler Inhale 1 puff into the lungs every 6 (six) hours  as needed for wheezing or shortness of breath.    [provider]  ALPRAZolam Duanne Moron) 0.5 MG tablet Take 0.5 mg by mouth 3 (three) times daily as needed for anxiety.  06/17/13   [provider]  amiodarone (PACERONE) 200 MG tablet Take 1 tablet (200 mg total) by mouth daily. 10/23/20   Adrian Prows, MD  atorvastatin (LIPITOR) 10 MG tablet TAKE 1 TABLET BY MOUTH EVERY DAY 08/17/20   Adrian Prows, MD  buPROPion (WELLBUTRIN XL) 150 MG 24 hr tablet Take 150 mg by mouth daily. 07/31/20   [provider]  digoxin (LANOXIN) 0.125 MG tablet Take 1  tablet (0.125 mg total) by mouth daily. 10/14/20   Nita Sells, MD  diphenhydrAMINE (BENADRYL) 25 MG tablet Take 25 mg by mouth every 6 (six) hours as needed for allergies.    [provider]  furosemide (LASIX) 40 MG tablet TAKE 1 TABLET BY MOUTH EVERY DAY 04/03/20   Adrian Prows, MD  lansoprazole (PREVACID) 15 MG capsule Take 15 mg by mouth as needed.    [provider]  metFORMIN (GLUCOPHAGE) 500 MG tablet Take 1 tablet (500 mg total) by mouth 2 (two) times daily with a meal. 10/13/20   Nita Sells, MD  Metoprolol Tartrate 75 MG TABS Take 75 mg by mouth in the morning and at bedtime. 10/23/20   Adrian Prows, MD  venlafaxine (EFFEXOR) 75 MG tablet Take 75 mg by mouth daily. 01/14/19   [provider]  XARELTO 15 MG TABS tablet TAKE 1 TABLET (15 MG TOTAL) BY MOUTH DAILY WITH SUPPER. 05/30/20   Adrian Prows, MD    Allergies:   Allergies  Allergen Reactions  . Morphine And Related Other (See Comments)    Seizure  . Valium [Diazepam] Other (See Comments)    "I can't walk"  . Latex Rash    Social History:  reports that she quit smoking about 10 years ago. She has a 30.00 pack-year smoking history. She has never used smokeless tobacco. She reports that she does not drink alcohol and does not use drugs.  Family History: Family History  Problem Relation Age of Onset  . Heart disease Mother   . Kidney disease Father   . Heart attack Father   . Sleep apnea Father   . Heart disease Sister      Objective   Physical Exam: Blood pressure 108/83, pulse 100, temperature 99.1 F (37.3 C), temperature source Oral, resp. rate 19, SpO2 97 %.  Physical Exam Vitals and nursing note reviewed.  Constitutional:      General: She is not in acute distress.    Appearance: She is obese.  HENT:     Head: Normocephalic.     Mouth/Throat:     Mouth: Mucous membranes are moist.  Eyes:     Conjunctiva/sclera: Conjunctivae normal.  Cardiovascular:     Rate and  Rhythm: Tachycardia present. Rhythm irregular.  Pulmonary:     Effort: No respiratory distress.     Breath sounds: No wheezing.     Comments: Slight conversational dyspnea Distant breath sounds Abdominal:     General: Abdomen is flat. There is no distension.  Musculoskeletal:     Comments: 1+ bilateral lower extremity edema Left lower extremity erythema, superficial skin tears and warmth without purulence LUE without ecchymoses 2/5 muscle strength bilateral lower extremities   Neurological:     Mental Status: She is alert.     Motor: Weakness present.  Psychiatric:  Mood and Affect: Mood normal.        Behavior: Behavior normal.     LABS on Admission: I have personally reviewed all the labs and imaging below    Basic Metabolic Panel: Recent Labs  Lab 11/16/2020 0424  NA 127*  K 3.6  CL 85*  CO2 27  GLUCOSE 130*  BUN 27*  CREATININE 1.77*  CALCIUM 8.2*  MG 1.5*   Liver Function Tests: Recent Labs  Lab 11/14/2020 0424  AST 26  ALT 16  ALKPHOS 60  BILITOT 1.0  PROT 6.2*  ALBUMIN 3.0*   No results for input(s): LIPASE, AMYLASE in the last 168 hours. No results for input(s): AMMONIA in the last 168 hours. CBC: Recent Labs  Lab 11/10/2020 0424  WBC 10.3  NEUTROABS 8.7*  HGB 11.1*  HCT 35.8*  MCV 85.4  PLT 256   Cardiac Enzymes: No results for input(s): CKTOTAL, CKMB, CKMBINDEX, TROPONINI in the last 168 hours. BNP: Invalid input(s): POCBNP CBG: No results for input(s): GLUCAP in the last 168 hours.  Radiological Exams on Admission:  DG Chest Portable 1 View  Result Date: 11/15/2020 CLINICAL DATA:  Dyspnea EXAM: PORTABLE CHEST 1 VIEW COMPARISON:  03/05/2019 FINDINGS: The lung bases are excluded on this lordotic, semi-erect radiograph. Despite this, perihilar and lower lung zone pulmonary infiltrates are present, most in keeping with mild to moderate cardiogenic failure. No pneumothorax. No definite pleural effusion. Cardiac size is enlarged, though not  fully included on this examination. IMPRESSION: Mild to moderate cardiogenic failure. Electronically Signed   By: Fidela [place] MD   On: 11/13/2020 05:19      EKG: atrial fibrillation, rate 144, LBBB   A & P   Principal Problem:   Atrial fibrillation with RVR (HCC) Active Problems:   Essential hypertension   LBBB (left bundle branch block)   Mixed hyperlipidemia   Acute on chronic systolic CHF (congestive heart failure) ([place])   Cellulitis of left leg   AKI (acute kidney injury) ([place])   Fall at home, initial encounter   1. A. fib with RVR with known LBBB a. Hemodynamically stable on 2 L/min O2 (new baseline from recent hospitalization) b. Digoxin level 1.0  c. Continue home Xarelto d. Continue amiodarone drip and home digoxin e. Goal K >4.0, goal Mg >2.0 f. [facility] cardiology consult  2. Acute on chronic systolic and diastolic heart failure  NICM a. Possibly tachyarrhythmia induced b. BNP elevated from baseline c. Recent echo on 10/09/2020 EF 35 to 40% d. Continue IV diuresis e. Monitor daily weights and intake/output f. Cardiology consulted  3. Left lower extremity nonpurulent cellulitis a. Afebrile without leukocytosis b. Likely has not completely resolved from her prior hospitalization c. Unable to obtain a photo for chart d. Start ceftriaxone  4. RLE weakness a. Reported focal weakness on history, has bilateral lower extremity weakness on exam b. Check CT head c. PT eval when able  5. AKI on CKD 3 a, Suspect cardiorenal a. Baseline Cr 0.98 on 2/11, currently 1.77 b. Continue management as above  6. Hypertension a. Currently soft BP though they are being taken from her forearm so unclear if accurate b. Continue amiodarone for now and holding home antihypertensives  7. Hyperlipidemia a. Continue home statin  8. LUE pain s/p fall from wheelchair a. Left shoulder XR i. Addendum: Xray positive for Anterior inferior dislocation of the left humeral head.  Discussed with Dr. Lorin Mercy, ortho, who recommended NPO for conscious sedation procedure possibly today  9.  Recent COVID 19 infection a. Dx 10/07/20 b. Discontinue contact/airborne precautions  10. Hypomagnesemia a. Replete  DVT prophylaxis: Xarelto   Code Status: Partial Code  Diet: Low-sodium Family Communication: Admission, patients condition and plan of care including tests being ordered have been discussed with the patient who indicates understanding and agrees with the plan and Code Status.  Disposition Plan: The appropriate patient status for this patient is INPATIENT. Inpatient status is judged to be reasonable and necessary in order to provide the required intensity of service to ensure the patient's safety. The patient's presenting symptoms, physical exam findings, and initial radiographic and laboratory data in the context of their chronic comorbidities is felt to place them at high risk for further clinical deterioration. Furthermore, it is not anticipated that the patient will be medically stable for discharge from the hospital within 2 midnights of admission. The following factors support the patient status of inpatient.   " The patient's presenting symptoms include dyspnea on exertion, right lower extremity weakness, left lower extremity rash, fall. " The worrisome physical exam findings include conversational dyspnea bilateral lower extremity weakness, left lower extremity cellulitis. " The initial radiographic and laboratory data are worrisome because of elevated BNP, CHF on XR. " The chronic co-morbidities include atrial fibrillation, morbid obesity, chronic HFrEF.   * I certify that at the point of admission it is my clinical judgment that the patient will require inpatient hospital care spanning beyond 2 midnights from the point of admission due to high intensity of service, high risk for further deterioration and high frequency of surveillance required.*   Status is:  Inpatient  Remains inpatient appropriate because:IV treatments appropriate due to intensity of illness or inability to take PO and Inpatient level of care appropriate due to severity of illness   Dispo: The patient is from: Home              Anticipated d/c is to: TBD, suspect SNF              Patient currently is not medically stable to d/c.   Difficult to place patient No         The medical decision making on this patient was of high complexity and the patient is at high risk for clinical deterioration, therefore this is a level 3 admission.  Consultants  . Cardiology  Procedures  . None  Time Spent on Admission: 72 minutes    Harold Hedge, DO [facility]  11/06/2020, 8:41 AM

## 2020-10-31 NOTE — Anesthesia Preprocedure Evaluation (Addendum)
Anesthesia Evaluation  Patient identified by MRN, date of birth, ID band Patient awake    Reviewed: Allergy & Precautions, H&P , NPO status , Patient's Chart, lab work & pertinent test results  Airway Mallampati: III  TM Distance: <3 FB Neck ROM: Limited    Dental no notable dental hx.    Pulmonary asthma , COPD,  oxygen dependent, former smoker,  covid positive 3 weeks ago   breath sounds clear to auscultation + decreased breath sounds      Cardiovascular hypertension, +CHF  Normal cardiovascular exam+ dysrhythmias Atrial Fibrillation  Rhythm:Regular Rate:Normal  EF 35%   Neuro/Psych negative neurological ROS  negative psych ROS   GI/Hepatic negative GI ROS, Neg liver ROS,   Endo/Other  Morbid obesity  Renal/GU Renal InsufficiencyRenal disease  negative genitourinary   Musculoskeletal negative musculoskeletal ROS (+)   Abdominal (+) + obese,   Peds negative pediatric ROS (+)  Hematology negative hematology ROS (+)   Anesthesia Other Findings Severe LE edema with cellulitits  Reproductive/Obstetrics negative OB ROS                            Anesthesia Physical Anesthesia Plan  ASA: IV  Anesthesia Plan: General   Post-op Pain Management:    Induction: Intravenous  PONV Risk Score and Plan: 0  Airway Management Planned: Mask  Additional Equipment:   Intra-op Plan:   Post-operative Plan: Extubation in OR  Informed Consent: I have reviewed the patients History and Physical, chart, labs and discussed the procedure including the risks, benefits and alternatives for the proposed anesthesia with the patient or authorized representative who has indicated his/her understanding and acceptance.     Dental advisory given  Plan Discussed with: CRNA and Surgeon  Anesthesia Plan Comments:         Anesthesia Quick Evaluation

## 2020-10-31 NOTE — Progress Notes (Signed)
Cross-coverage note:   Patient seen for somnolence, increased supplemental O2 requirement. She is becoming more alert now with increased FiO2, able to answer some yes/no questions.   She is hypercarbic on ABG. Discussed situation with patient, RN, and RT and plan to start BiPAP for now with frequent reassessment.

## 2020-10-31 NOTE — Consult Note (Signed)
Reason for Consult: Left shoulder pain and dislocation Referring Physician: hospitalist  Lorraine Harris is an 70 y.o. female.  HPI: 70 year old white female is being seen at the request of hospitalist for left shoulder pain and dislocation. Patient was very drowsy during my visit with her this afternoon but she states that she fell yesterday in her home. X-rays in the ED showed anterior inferior dislocation of the left humeral head. Patient also admitted for other multiple medical issues.  Past Medical History:  Diagnosis Date  . A-fib ([place])   . A-fib ([place])   . Asthma   . CHF (congestive heart failure) ([place])   . 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 Hyperlipidemia   . Hypertension     Past Surgical History:  Procedure Laterality Date  . LEFT HEART CATHETERIZATION WITH CORONARY ANGIOGRAM N/A 09/20/2014   Procedure: LEFT HEART CATHETERIZATION WITH CORONARY ANGIOGRAM;  Surgeon: Laverda Page, MD;  Location: [facility] CATH LAB;  Service: Cardiovascular;  Laterality: N/A;    Family History  Problem Relation Age of Onset  . Heart disease Mother   . Kidney disease Father   . Heart attack Father   . Sleep apnea Father   . Heart disease Sister     Social History:  reports that she quit smoking about 10 years ago. She has a 30.00 pack-year smoking history. She has never used smokeless tobacco. She reports that she does not drink alcohol and does not use drugs.  Allergies:  Allergies  Allergen Reactions  . Morphine And Related Other (See Comments)    Seizure  . Valium [Diazepam] Other (See Comments)    "I can't walk"  . Latex Rash    Medications: I have reviewed the patient's current medications.  Results for orders placed or performed during the hospital encounter of 11/13/2020 (from the past 48 hour(s))  CBC with Differential/Platelet     Status: Abnormal   Collection Time: 11/23/2020  4:24 AM  Result Value Ref Range   WBC 10.3 4.0 - 10.5 K/uL    RBC 4.19 3.87 - 5.11 MIL/uL   Hemoglobin 11.1 (L) 12.0 - 15.0 g/dL   HCT 35.8 (L) 36.0 - 46.0 %   MCV 85.4 80.0 - 100.0 fL   MCH 26.5 26.0 - 34.0 pg   MCHC 31.0 30.0 - 36.0 g/dL   RDW 19.6 (H) 11.5 - 15.5 %   Platelets 256 150 - 400 K/uL   nRBC 0.0 0.0 - 0.2 %   Neutrophils Relative % 85 %   Neutro Abs 8.7 (H) 1.7 - 7.7 K/uL   Lymphocytes Relative 7 %   Lymphs Abs 0.7 0.7 - 4.0 K/uL   Monocytes Relative 8 %   Monocytes Absolute 0.8 0.1 - 1.0 K/uL   Eosinophils Relative 0 %   Eosinophils Absolute 0.0 0.0 - 0.5 K/uL   Basophils Relative 0 %   Basophils Absolute 0.0 0.0 - 0.1 K/uL   Immature Granulocytes 0 %   Abs Immature Granulocytes 0.03 0.00 - 0.07 K/uL    Comment: Performed at [facility], [place] [address], [place], [place] [address]  Comprehensive metabolic panel     Status: Abnormal   Collection Time: 11/15/2020  4:24 AM  Result Value Ref Range   Sodium 127 (L) 135 - 145 mmol/L   Potassium 3.6 3.5 - 5.1 mmol/L   Chloride 85 (L) 98 - 111 mmol/L   CO2 27 22 - 32 mmol/L   Glucose, Bld  130 (H) 70 - 99 mg/dL    Comment: Glucose reference range applies only to samples taken after fasting for at least 8 hours.   BUN 27 (H) 8 - 23 mg/dL   Creatinine, Ser 1.77 (H) 0.44 - 1.00 mg/dL   Calcium 8.2 (L) 8.9 - 10.3 mg/dL   Total Protein 6.2 (L) 6.5 - 8.1 g/dL   Albumin 3.0 (L) 3.5 - 5.0 g/dL   AST 26 15 - 41 U/L   ALT 16 0 - 44 U/L   Alkaline Phosphatase 60 38 - 126 U/L   Total Bilirubin 1.0 0.3 - 1.2 mg/dL   GFR, Estimated 31 (L) >60 mL/min    Comment: (NOTE) Calculated using the CKD-EPI Creatinine Equation (2021)    Anion gap 15 5 - 15    Comment: Performed at [facility], [place] [address], [place], [place] [address]  Troponin I (High Sensitivity)     Status: Abnormal   Collection Time: 11/02/2020  4:24 AM  Result Value Ref Range   Troponin I (High Sensitivity) 21 (H) <18 ng/L    Comment: (NOTE) Elevated high sensitivity troponin I (hsTnI)  values and significant  changes across serial measurements may suggest ACS but many other  chronic and acute conditions are known to elevate hsTnI results.  Refer to the "Links" section for chest pain algorithms and additional  guidance. Performed at [facility], [place] [address] [place], [place] [address]   Digoxin level     Status: None   Collection Time: 11/14/2020  4:24 AM  Result Value Ref Range   Digoxin Level 1.0 0.8 - 2.0 ng/mL    Comment: Performed at [facility], [place] [address], [place], [place] [address]  Magnesium     Status: Abnormal   Collection Time: 11/27/2020  4:24 AM  Result Value Ref Range   Magnesium 1.5 (L) 1.7 - 2.4 mg/dL    Comment: Performed at [facility], [place] [address], [place], [place] [address]  Brain natriuretic peptide     Status: Abnormal   Collection Time: 11/09/2020  4:26 AM  Result Value Ref Range   B Natriuretic Peptide 935.5 (H) 0.0 - 100.0 pg/mL    Comment: Performed at [facility], [place] [address], [place], [place] [address]  Troponin I (High Sensitivity)     Status: Abnormal   Collection Time: 11/20/2020  6:39 AM  Result Value Ref Range   Troponin I (High Sensitivity) 25 (H) <18 ng/L    Comment: (NOTE) Elevated high sensitivity troponin I (hsTnI) values and significant  changes across serial measurements may suggest ACS but many other  chronic and acute conditions are known to elevate hsTnI results.  Refer to the "Links" section for chest pain algorithms and additional  guidance. Performed at [facility], [place] [address], [place], [place] [address]   MRSA PCR Screening     Status: None   Collection Time: 11/07/2020  9:19 AM   Specimen: Nasal Mucosa; Nasopharyngeal  Result Value Ref Range   MRSA by PCR NEGATIVE NEGATIVE    Comment:        The GeneXpert MRSA Assay (FDA approved for NASAL specimens only), is one component of a comprehensive MRSA  colonization surveillance program. It is not intended to diagnose MRSA infection nor to guide or monitor treatment for MRSA infections. Performed at [facility], [place] [address] [place], [place] [address]     CT HEAD WO  CONTRAST  Result Date: 11/25/2020 CLINICAL DATA:  Right lower extremity weakness.  Headache.  Fatigue EXAM: CT HEAD WITHOUT CONTRAST TECHNIQUE: Contiguous axial images were obtained from the base of the skull through the vertex without intravenous contrast. COMPARISON:  None. FINDINGS: Brain: There is atrophy and chronic small vessel disease changes. No acute intracranial abnormality. Specifically, no hemorrhage, hydrocephalus, mass lesion, acute infarction, or significant intracranial injury. Vascular: No hyperdense vessel or unexpected calcification. Skull: No acute calvarial abnormality. Sinuses/Orbits: No acute abnormality. Other: None IMPRESSION: Atrophy, chronic microvascular disease. No acute intracranial abnormality. Electronically Signed   By: Rolm Baptise M.D.   On: 11/04/2020 08:47   DG Chest Portable 1 View  Result Date: 11/21/2020 CLINICAL DATA:  Dyspnea EXAM: PORTABLE CHEST 1 VIEW COMPARISON:  03/05/2019 FINDINGS: The lung bases are excluded on this lordotic, semi-erect radiograph. Despite this, perihilar and lower lung zone pulmonary infiltrates are present, most in keeping with mild to moderate cardiogenic failure. No pneumothorax. No definite pleural effusion. Cardiac size is enlarged, though not fully included on this examination. IMPRESSION: Mild to moderate cardiogenic failure. Electronically Signed   By: Fidela [place] MD   On: 11/19/2020 05:19   DG Shoulder Left Port  Result Date: 11/21/2020 CLINICAL DATA:  Recent fall with left shoulder pain, initial encounter EXAM: LEFT SHOULDER COMPARISON:  None. FINDINGS: Anterior inferior dislocation of the humeral head is noted with respect to the glenoid. Hill-Sachs deformity is noted within the  humeral head. No definitive fracture is seen. No soft tissue abnormality is noted. IMPRESSION: Anterior inferior dislocation of the left humeral head. Electronically Signed   By: Inez Catalina M.D.   On: 11/30/2020 09:12    Review of Systems  Reason unable to perform ROS: Patient very drowsy while I was in her room.  Constitutional: Positive for activity change.   Blood pressure (!) 110/93, pulse 100, temperature 97.6 F (36.4 C), temperature source Oral, resp. rate (!) 25, height 5\' 4"  (1.626 m), SpO2 95 %. Physical Exam Constitutional:      Appearance: She is obese.     Comments: Lethargic  HENT:     Head: Normocephalic and atraumatic.  Pulmonary:     Effort: No respiratory distress.  Musculoskeletal:     Comments: Left upper extremity she has good grip strength. Radial pulse intact.      Assessment/Plan: Left shoulder anterior inferior dislocation  Dr. Lorin Mercy will plan to take patient to the OR this evening for left shoulder closed reduction under anesthesia. I did attempt to discuss this with patient. Again likely secondary to pain medication that was recently given by RN. Dr. Lorin Mercy has reviewed x-rays.Benjiman Core 11/17/2020, 2:26 PM

## 2020-10-31 NOTE — Op Note (Signed)
Pre and postop diagnosis left shoulder anterior dislocation  Procedure closed reduction left shoulder under anesthesia  Surgeon: Lorin Mercy MD  Anesthesia IV sedation mask oxygen.  Brief history 70 year old obese female fell out of her wheelchair she is 3 weeks post Covid diagnosis and represented with hypoxia, respiratory distress atrial fibs.  She is admitted to the hospital and complained of shoulder pain x-ray demonstrated anterior dislocation of her shoulder which was noted when she was up on the floor.  Patient was seen in consultation is now brought to the operating room.  Procedure: After induction of IV sedation anesthetic drugs see their information a foot with shoe cover was placed in her axilla with no shoe on and gentle axillary traction pulling at the wrist was performed and shoulder popped and after 4 to 5 seconds of longitudinal traction with pop.  Single x-ray confirmed the shoulder was reduced.  She is placed in a shoulder immobilizer which will remain on for several weeks.

## 2020-10-31 NOTE — Progress Notes (Signed)
Positive for left shoulder reduction under anesthesia for left shoulder dislocation.  Patient is fully anticoagulated and this is for attempted closed reduction only.  She will not have an open procedure and if the shoulder cannot be reduced then she would have to return at a later date for an open procedure.  Surgery scheduled around 5 PM.

## 2020-10-31 NOTE — Progress Notes (Signed)
Patient returned from procedure, lethargic. 128-140 Afib sustained. 124/98, 98% 8 lt mask. Anesthesiologist at bedside and request pt be started on BIPAP. Pt is arousal to pain.  Hospitalist provider contacted with update.Amiodarone gtt 60 mg/hr restarted per previous order.  V.O for ABG and wait for bipap. Pt is limited code.

## 2020-10-31 NOTE — ED Provider Notes (Signed)
[facility] DEPT Provider Note   CSN: [id number] Arrival date & time: 11/13/2020  0410     History Chief Complaint  Patient presents with  . Covid Positive  . Atrial Fibrillation    Lorraine Harris is a 70 y.o. female.  The history is provided by the EMS personnel and the patient.  Illness Location:  At home  Quality:  Fatigue SOB and AIFB with RVR Severity:  Moderate Onset quality:  Gradual Duration:  4 days Timing:  Constant Progression:  Worsening Chronicity:  Recurrent Context:  Recent covid and h/o afib Relieved by:  Nothing  Worsened by:  Time Ineffective treatments:  Home medications and O2 Associated symptoms: fatigue, headaches and shortness of breath   Associated symptoms: no abdominal pain, no chest pain, no congestion, no cough, no diarrhea, no fever, no loss of consciousness, no myalgias, no nausea, no vomiting and no wheezing   Fatigue:    Severity:  Severe   Timing:  Constant   Progression:  Worsening Headaches:    Severity:  Mild   Onset quality:  Sudden   Timing:  Intermittent   Progression:  Unchanged Risk factors:  Elderly patient       Past Medical History:  Diagnosis Date  . A-fib ([place])   . A-fib ([place])   . Asthma   . CHF (congestive heart failure) ([place])   . Chronic combined systolic and diastolic heart failure ([place]) 12/17/2018  . Hyperlipidemia   . Hypertension     Patient Active Problem List   Diagnosis Date Noted  . Atrial fibrillation with RVR ([place]) 10/07/2020  . Acute on chronic systolic CHF (congestive heart failure) ([place]) 10/07/2020  . Cellulitis of left leg 10/07/2020  . Atrial fibrillation with rapid ventricular response ([place]) 10/07/2020  . Contusion of left knee 10/19/2019  . Chronic combined systolic and diastolic heart failure ([place]) 12/17/2018  . Mixed hyperlipidemia 12/17/2018  . Dilated cardiomyopathy ([place]) 09/20/2014  . LBBB (left bundle branch block) 09/20/2014  . AORTIC VALVE  DISORDERS 03/21/2008  . OTITIS EXTERNA, ACUTE, LEFT 03/14/2008  . COLONIC POLYPS, BENIGN 07/09/2007  . Morbid obesity ([place]) 07/09/2007  . DEPRESSION 07/09/2007  . MIGRAINE HEADACHE 07/09/2007  . Essential hypertension 07/09/2007  . ESOPHAGEAL STRICTURE 05/11/2001    Past Surgical History:  Procedure Laterality Date  . LEFT HEART CATHETERIZATION WITH CORONARY ANGIOGRAM N/A 09/20/2014   Procedure: LEFT HEART CATHETERIZATION WITH CORONARY ANGIOGRAM;  Surgeon: Laverda Page, MD;  Location: [facility] CATH LAB;  Service: Cardiovascular;  Laterality: N/A;     OB History   No obstetric history on file.     Family History  Problem Relation Age of Onset  . Heart disease Mother   . Kidney disease Father   . Heart attack Father   . Sleep apnea Father   . Heart disease Sister     Social History   Tobacco Use  . Smoking status: Former Smoker    Packs/day: 1.00    Years: 30.00    Pack years: 30.00    Quit date: 2012    Years since quitting: 10.1  . Smokeless tobacco: Never Used  Vaping Use  . Vaping Use: Never used  Substance Use Topics  . Alcohol use: No  . Drug use: No    Home Medications Prior to Admission medications   Medication Sig Start Date End Date Taking? Authorizing Provider  albuterol (PROVENTIL HFA;VENTOLIN HFA) 108 (90 BASE) MCG/ACT inhaler Inhale 1 puff into the lungs every 6 (  six) hours as needed for wheezing or shortness of breath.    [provider]  ALPRAZolam Duanne Moron) 0.5 MG tablet Take 0.5 mg by mouth 3 (three) times daily as needed for anxiety.  06/17/13   [provider]  amiodarone (PACERONE) 200 MG tablet Take 1 tablet (200 mg total) by mouth daily. 10/23/20   Adrian Prows, MD  atorvastatin (LIPITOR) 10 MG tablet TAKE 1 TABLET BY MOUTH EVERY DAY 08/17/20   Adrian Prows, MD  buPROPion (WELLBUTRIN XL) 150 MG 24 hr tablet Take 150 mg by mouth daily. 07/31/20   [provider]  digoxin (LANOXIN) 0.125 MG tablet Take 1 tablet (0.125 mg  total) by mouth daily. 10/14/20   Nita Sells, MD  diphenhydrAMINE (BENADRYL) 25 MG tablet Take 25 mg by mouth every 6 (six) hours as needed for allergies.    [provider]  furosemide (LASIX) 40 MG tablet TAKE 1 TABLET BY MOUTH EVERY DAY 04/03/20   Adrian Prows, MD  lansoprazole (PREVACID) 15 MG capsule Take 15 mg by mouth as needed.    [provider]  metFORMIN (GLUCOPHAGE) 500 MG tablet Take 1 tablet (500 mg total) by mouth 2 (two) times daily with a meal. 10/13/20   Nita Sells, MD  Metoprolol Tartrate 75 MG TABS Take 75 mg by mouth in the morning and at bedtime. 10/23/20   Adrian Prows, MD  venlafaxine (EFFEXOR) 75 MG tablet Take 75 mg by mouth daily. 01/14/19   [provider]  XARELTO 15 MG TABS tablet TAKE 1 TABLET (15 MG TOTAL) BY MOUTH DAILY WITH SUPPER. 05/30/20   Adrian Prows, MD    Allergies    Morphine and related, Valium [diazepam], and Latex  Review of Systems   Review of Systems  Constitutional: Positive for fatigue. Negative for fever.  HENT: Negative for congestion.   Eyes: Negative for visual disturbance.  Respiratory: Positive for shortness of breath. Negative for cough and wheezing.   Cardiovascular: Positive for leg swelling. Negative for chest pain.  Gastrointestinal: Negative for abdominal pain, diarrhea, nausea and vomiting.  Genitourinary: Negative for difficulty urinating.  Musculoskeletal: Negative for myalgias.  Skin: Negative for pallor.  Neurological: Positive for headaches. Negative for dizziness, loss of consciousness, facial asymmetry and speech difficulty.  Psychiatric/Behavioral: Negative for agitation.  All other systems reviewed and are negative.   Physical Exam Updated Vital Signs BP 96/67   Pulse 100   Temp 99.1 F (37.3 C) (Oral)   Resp 19   SpO2 97%   Physical Exam Vitals and nursing note reviewed.  Constitutional:      Appearance: Normal appearance. She is not diaphoretic.  HENT:     Head:  Normocephalic and atraumatic.     Nose: Nose normal.     Mouth/Throat:     Mouth: Mucous membranes are moist.  Eyes:     Extraocular Movements: Extraocular movements intact.     Conjunctiva/sclera: Conjunctivae normal.     Pupils: Pupils are equal, round, and reactive to light.  Cardiovascular:     Rate and Rhythm: Tachycardia present. Rhythm irregular.     Pulses: Normal pulses.     Heart sounds: Normal heart sounds.  Pulmonary:     Effort: Tachypnea present.     Breath sounds: Decreased air movement present. No stridor. Rales present.  Abdominal:     General: Abdomen is flat. Bowel sounds are normal.     Palpations: Abdomen is soft.     Tenderness: There is no abdominal tenderness. There  is no guarding.  Musculoskeletal:     Cervical back: Normal range of motion and neck supple.     Right lower leg: Edema present.     Left lower leg: Edema present.  Lymphadenopathy:     Cervical: No cervical adenopathy.  Skin:    General: Skin is warm and dry.     Capillary Refill: Capillary refill takes less than 2 seconds.  Neurological:     Mental Status: She is alert.     Deep Tendon Reflexes: Reflexes normal.  Psychiatric:        Mood and Affect: Mood normal.        Behavior: Behavior normal.     ED Results / Procedures / Treatments   Labs (all labs ordered are listed, but only abnormal results are displayed) Results for orders placed or performed during the hospital encounter of 11/19/2020  CBC with Differential/Platelet  Result Value Ref Range   WBC 10.3 4.0 - 10.5 K/uL   RBC 4.19 3.87 - 5.11 MIL/uL   Hemoglobin 11.1 (L) 12.0 - 15.0 g/dL   HCT 35.8 (L) 36.0 - 46.0 %   MCV 85.4 80.0 - 100.0 fL   MCH 26.5 26.0 - 34.0 pg   MCHC 31.0 30.0 - 36.0 g/dL   RDW 19.6 (H) 11.5 - 15.5 %   Platelets 256 150 - 400 K/uL   nRBC 0.0 0.0 - 0.2 %   Neutrophils Relative % 85 %   Neutro Abs 8.7 (H) 1.7 - 7.7 K/uL   Lymphocytes Relative 7 %   Lymphs Abs 0.7 0.7 - 4.0 K/uL   Monocytes  Relative 8 %   Monocytes Absolute 0.8 0.1 - 1.0 K/uL   Eosinophils Relative 0 %   Eosinophils Absolute 0.0 0.0 - 0.5 K/uL   Basophils Relative 0 %   Basophils Absolute 0.0 0.0 - 0.1 K/uL   Immature Granulocytes 0 %   Abs Immature Granulocytes 0.03 0.00 - 0.07 K/uL  Comprehensive metabolic panel  Result Value Ref Range   Sodium 127 (L) 135 - 145 mmol/L   Potassium 3.6 3.5 - 5.1 mmol/L   Chloride 85 (L) 98 - 111 mmol/L   CO2 27 22 - 32 mmol/L   Glucose, Bld 130 (H) 70 - 99 mg/dL   BUN 27 (H) 8 - 23 mg/dL   Creatinine, Ser 1.77 (H) 0.44 - 1.00 mg/dL   Calcium 8.2 (L) 8.9 - 10.3 mg/dL   Total Protein 6.2 (L) 6.5 - 8.1 g/dL   Albumin 3.0 (L) 3.5 - 5.0 g/dL   AST 26 15 - 41 U/L   ALT 16 0 - 44 U/L   Alkaline Phosphatase 60 38 - 126 U/L   Total Bilirubin 1.0 0.3 - 1.2 mg/dL   GFR, Estimated 31 (L) >60 mL/min   Anion gap 15 5 - 15  Digoxin level  Result Value Ref Range   Digoxin Level 1.0 0.8 - 2.0 ng/mL  Magnesium  Result Value Ref Range   Magnesium 1.5 (L) 1.7 - 2.4 mg/dL  Troponin I (High Sensitivity)  Result Value Ref Range   Troponin I (High Sensitivity) 21 (H) <18 ng/L   CT Angio Chest PE W/Cm &/Or Wo Cm  Result Date: 10/06/2020 CLINICAL DATA:  70 year old female with concern for pulmonary embolism. EXAM: CT ANGIOGRAPHY CHEST WITH CONTRAST TECHNIQUE: Multidetector CT imaging of the chest was performed using the standard protocol during bolus administration of intravenous contrast. Multiplanar CT image reconstructions and MIPs were obtained to evaluate the vascular  anatomy. CONTRAST:  143mL OMNIPAQUE IOHEXOL 350 MG/ML SOLN COMPARISON:  Chest radiograph dated 03/05/2019. FINDINGS: Evaluation is limited due to streak artifact caused by patient's arms. Cardiovascular: There is mild cardiomegaly. No pericardial effusion. Coronary vascular calcification and calcification of the mitral annulus. There is retrograde flow of contrast from the right atrium into the IVC suggestive of a degree  of right heart dysfunction. There is mild atherosclerotic calcification of the thoracic aorta. The aorta is tortuous. Mild dilatation of the main pulmonary trunk suggestive of pulmonary hypertension. Clinical correlation is recommended. Evaluation of the pulmonary arteries is limited due to respiratory motion artifact. No large or central pulmonary artery embolus identified. Mediastinum/Nodes: No hilar or mediastinal adenopathy. The esophagus is grossly unremarkable. No mediastinal fluid collection. Lungs/Pleura: Diffuse bilateral streaky and interstitial densities may represent atelectasis or atypical infection. Clinical correlation is recommended. No pleural effusion or pneumothorax. The central airways are patent. Upper Abdomen: Indeterminate 2.5 cm right renal upper pole hypodense lesion corresponding to the cyst seen on the prior CT. Musculoskeletal: Degenerative changes of the spine. No acute osseous pathology. Review of the MIP images confirms the above findings. IMPRESSION: 1. No CT evidence of central pulmonary artery embolus. 2. Mild cardiomegaly with findings of right heart dysfunction. Clinical correlation is recommended. 3. Diffuse bilateral streaky and interstitial densities may represent atelectasis or atypical infection. Clinical correlation is recommended. 4. Aortic Atherosclerosis (ICD10-I70.0). Electronically Signed   By: Anner Crete M.D.   On: 10/06/2020 22:55   DG Chest Portable 1 View  Result Date: 11/10/2020 CLINICAL DATA:  Dyspnea EXAM: PORTABLE CHEST 1 VIEW COMPARISON:  03/05/2019 FINDINGS: The lung bases are excluded on this lordotic, semi-erect radiograph. Despite this, perihilar and lower lung zone pulmonary infiltrates are present, most in keeping with mild to moderate cardiogenic failure. No pneumothorax. No definite pleural effusion. Cardiac size is enlarged, though not fully included on this examination. IMPRESSION: Mild to moderate cardiogenic failure. Electronically Signed    By: Fidela Salisbury MD   On: 11/15/2020 05:19   VAS [place] LOWER EXTREMITY VENOUS (DVT)  Result Date: 10/08/2020  Lower Venous DVT Study Other Indications: Covid. Limitations: Body habitus. Comparison Study: No previous exam Performing Technologist: Vonzell Schlatter  Examination Guidelines: A complete evaluation includes B-mode imaging, spectral Doppler, color Doppler, and power Doppler as needed of all accessible portions of each vessel. Bilateral testing is considered an integral part of a complete examination. Limited examinations for reoccurring indications may be performed as noted. The reflux portion of the exam is performed with the patient in reverse Trendelenburg.  +---------+---------------+---------+-----------+----------+--------------+ RIGHT    CompressibilityPhasicitySpontaneityPropertiesThrombus Aging +---------+---------------+---------+-----------+----------+--------------+ CFV      Full           Yes      Yes                                 +---------+---------------+---------+-----------+----------+--------------+ SFJ      Full                                                        +---------+---------------+---------+-----------+----------+--------------+ FV Prox  Full                                                        +---------+---------------+---------+-----------+----------+--------------+  FV Mid   Full                                                        +---------+---------------+---------+-----------+----------+--------------+ FV DistalFull                                                        +---------+---------------+---------+-----------+----------+--------------+ PFV      Full                                                        +---------+---------------+---------+-----------+----------+--------------+ POP      Full           Yes      Yes                                  +---------+---------------+---------+-----------+----------+--------------+ PTV      Full                                                        +---------+---------------+---------+-----------+----------+--------------+ PERO     Full                                                        +---------+---------------+---------+-----------+----------+--------------+   +---------+---------------+---------+-----------+----------+--------------+ LEFT     CompressibilityPhasicitySpontaneityPropertiesThrombus Aging +---------+---------------+---------+-----------+----------+--------------+ CFV      Full           Yes      Yes                                 +---------+---------------+---------+-----------+----------+--------------+ SFJ      Full                                                        +---------+---------------+---------+-----------+----------+--------------+ FV Prox  Full                                                        +---------+---------------+---------+-----------+----------+--------------+ FV Mid   Full                                                        +---------+---------------+---------+-----------+----------+--------------+  FV DistalFull                                                        +---------+---------------+---------+-----------+----------+--------------+ PFV      Full                                                        +---------+---------------+---------+-----------+----------+--------------+ POP      Full           Yes      Yes                                 +---------+---------------+---------+-----------+----------+--------------+ PTV      Full                                                        +---------+---------------+---------+-----------+----------+--------------+ PERO     Full                                                         +---------+---------------+---------+-----------+----------+--------------+     Summary: BILATERAL: - No evidence of deep vein thrombosis seen in the lower extremities, bilaterally. -No evidence of popliteal cyst, bilaterally.   *See table(s) above for measurements and observations. Electronically signed by Monica Martinez MD on 10/08/2020 at 2:32:49 PM.    Final    ECHOCARDIOGRAM LIMITED  Result Date: 10/09/2020    ECHOCARDIOGRAM LIMITED REPORT   Patient Name:   Lorraine Harris Date of Exam: 10/09/2020 Medical Rec #:  [id number]           Height:       64.0 in Accession #:    [id number]          Weight:       321.4 lb Date of Birth:  1950/10/10            BSA:          2.393 m Patient Age:    15 years            BP:           90/76 mmHg Patient Gender: F                   HR:           118 bpm. Exam Location:  Inpatient Procedure: Limited Echo, Limited Color Doppler, Cardiac Doppler and Intracardiac            Opacification Agent Indications:     CHF-Acute Systolic B28.41  History:         Patient has prior history of Echocardiogram examinations, most                  recent 04/22/2019. Arrythmias:Atrial Fibrillation; Risk  Factors:Hypertension and Dyslipidemia. COVID 19.  Sonographer:     Darlina Sicilian RDCS Referring Phys:  [id number] [place] Diagnosing Phys: Adrian Prows MD  Sonographer Comments: Suboptimal parasternal window and Technically difficult study due to poor echo windows. IMPRESSIONS  1. Left ventricular ejection fraction, by estimation, is 35 to 40%. Left ventricular ejection fraction by 2D MOD biplane is 42.7 %. The left ventricle has mild to moderately decreased function. The left ventricle demonstrates global hypokinesis. Left ventricular diastolic parameters are indeterminate.  2. Right ventricular systolic function was not well visualized. There is moderately elevated pulmonary artery systolic pressure. The estimated right ventricular systolic pressure is 65.6 mmHg.  3. Left  atrial size was moderately dilated.  4. The mitral valve is grossly normal. Trivial mitral valve regurgitation.  5. The aortic valve is calcified. Aortic valve regurgitation is not visualized. Mild aortic valve stenosis.  6. The inferior vena cava is dilated in size with <50% respiratory variability, suggesting right atrial pressure of 15 mmHg. Comparison(s): Compared to 04/22/2019, no significant change in EF 35-40%$. FINDINGS  Left Ventricle: Left ventricular ejection fraction, by estimation, is 35 to 40%. Left ventricular ejection fraction by 2D MOD biplane is 42.7 %. The left ventricle has mild to moderately decreased function. The left ventricle demonstrates global hypokinesis. Definity contrast agent was given IV to delineate the left ventricular endocardial borders. The left ventricular internal cavity size was normal in size. There is no left ventricular hypertrophy. Abnormal (paradoxical) septal motion, consistent with left bundle branch block. Left ventricular diastolic parameters are indeterminate. Right Ventricle: Right ventricular systolic function was not well visualized. There is moderately elevated pulmonary artery systolic pressure. The tricuspid regurgitant velocity is 2.65 m/s, and with an assumed right atrial pressure of 15 mmHg, the estimated right ventricular systolic pressure is 81.2 mmHg. Left Atrium: Left atrial size was moderately dilated. Right Atrium: Right atrial size was not well visualized. Pericardium: There is no evidence of pericardial effusion. Mitral Valve: The mitral valve is grossly normal. Trivial mitral valve regurgitation. Tricuspid Valve: The tricuspid valve is grossly normal. Tricuspid valve regurgitation is mild. Aortic Valve: The aortic valve is calcified. Aortic valve regurgitation is not visualized. Mild aortic stenosis is present. Aortic valve mean gradient measures 6.5 mmHg. Aortic valve peak gradient measures 12.7 mmHg. Aortic valve area, by VTI measures 1.32 cm.  Pulmonic Valve: The pulmonic valve was not well visualized. Pulmonic valve regurgitation is not visualized. Aorta: The aortic root is normal in size and structure. Venous: The inferior vena cava is dilated in size with less than 50% respiratory variability, suggesting right atrial pressure of 15 mmHg. IAS/Shunts: No atrial level shunt detected by color flow Doppler. LEFT VENTRICLE PLAX 2D                        Biplane EF (MOD) LVOT diam:     1.80 cm         LV Biplane EF:   Left LV SV:         48                               ventricular LV SV Index:   20                               ejection LVOT Area:     2.54 cm  fraction by                                                 2D MOD                                                 biplane is LV Volumes (MOD)                                42.7 %. LV vol d, MOD    161.0 ml A2C: LV vol d, MOD    194.0 ml A4C: LV vol s, MOD    92.8 ml A2C: LV vol s, MOD    113.0 ml A4C: LV SV MOD A2C:   68.2 ml LV SV MOD A4C:   194.0 ml LV SV MOD BP:    77.9 ml AORTIC VALVE AV Area (Vmax):    1.58 cm AV Area (Vmean):   1.63 cm AV Area (VTI):     1.32 cm AV Vmax:           178.50 cm/s AV Vmean:          119.000 cm/s AV VTI:            0.366 m AV Peak Grad:      12.7 mmHg AV Mean Grad:      6.5 mmHg LVOT Vmax:         111.00 cm/s LVOT Vmean:        76.400 cm/s LVOT VTI:          0.190 m LVOT/AV VTI ratio: 0.52  AORTA Ao Root diam: 2.80 cm TRICUSPID VALVE TR Peak grad:   28.1 mmHg TR Vmax:        265.00 cm/s  SHUNTS Systemic VTI:  0.19 m Systemic Diam: 1.80 cm Adrian Prows MD Electronically signed by Adrian Prows MD Signature Date/Time: 10/09/2020/1:14:46 PM    Final     EKG  Date: 11/27/2020  Rate: 144  Rhythm:AFIB with RVR  QRS Axis: left  Intervals: normal  ST/T Wave abnormalities: normal  Conduction Disutrbances: none  Narrative Interpretation:AFib with RVR, LBBB    Radiology No results found.  Procedures Procedures   Medications Ordered in  ED Medications  furosemide (LASIX) injection 40 mg (has no administration in time range)  lidocaine (LIDODERM) 5 % 1 patch (has no administration in time range)  amiodarone (NEXTERONE) 1.8 mg/mL load via infusion 150 mg (has no administration in time range)    Followed by  amiodarone (NEXTERONE PREMIX) 360-4.14 MG/200ML-% (1.8 mg/mL) IV infusion (has no administration in time range)    Followed by  amiodarone (NEXTERONE PREMIX) 360-4.14 MG/200ML-% (1.8 mg/mL) IV infusion (has no administration in time range)   MDM Reviewed: previous chart, nursing note and vitals Reviewed previous: labs and ECG Interpretation: labs, ECG and x-ray (pulmonary edema by me on CXR ) Total time providing critical care: 75-105 minutes (amiodarone drip). This excludes time spent performing separately reportable procedures and services. Consults: admitting MD  CRITICAL CARE Performed by: Zsazsa Bahena K Miriah Maruyama-Rasch Total critical care time: 75 minutes Critical care time was exclusive of separately billable procedures and treating other patients.  Critical care was necessary to treat or prevent imminent or life-threatening deterioration. Critical care was time spent personally by me on the following activities: development of treatment plan with patient and/or surrogate as well as nursing, discussions with consultants, evaluation of patient's response to treatment, examination of patient, obtaining history from patient or surrogate, ordering and performing treatments and interventions, ordering and review of laboratory studies, ordering and review of radiographic studies, pulse oximetry and re-evaluation of patient's condition.  ED Course  I have reviewed the triage vital signs and the nursing notes.  Pertinent labs & imaging results that were available during my care of the patient were reviewed by me and considered in my medical decision making (see chart for details).    Lorraine Harris was evaluated in Emergency  Department on 11/22/2020 for the symptoms described in the history of present illness. She was evaluated in the context of the global COVID-19 pandemic, which necessitated consideration that the patient might be at risk for infection with the SARS-CoV-2 virus that causes COVID-19. Institutional protocols and algorithms that pertain to the evaluation of patients at risk for COVID-19 are in a state of rapid change based on information released by regulatory bodies including the CDC and federal and state organizations. These policies and algorithms were followed during the patient's care in the ED.  Final Clinical Impression(s) / ED Diagnoses  Admit to medicine    Talyn Eddie, MD 11/13/2020 1700

## 2020-10-31 NOTE — Transfer of Care (Signed)
Immediate Anesthesia Transfer of Care Note  Patient: Lorraine Harris  Procedure(s) Performed: Procedure(s): CLOSED REDUCTION SHOULDER (Left)  Patient Location: ICU  Anesthesia Type:MAC  Level of Consciousness: awake, alert  and oriented  Airway & Oxygen Therapy: Patient Spontanous Breathing and Patient connected to nasal cannula oxygen  Post-op Assessment: Report given to RN and Post -op Vital signs reviewed and stable  Post vital signs: Reviewed and stable  Last Vitals:  Vitals:   11/12/2020 1600 11/12/2020 1703  BP:    Pulse:    Resp:    Temp: 36.7 C   SpO2:  94%    Complications: No apparent anesthesia complications

## 2020-10-31 NOTE — Interval H&P Note (Signed)
History and Physical Interval Note:  11/24/2020 5:42 PM  Lorraine Harris  has presented today for surgery, with the diagnosis of dislocated left shoulder.  The various methods of treatment have been discussed with the patient and family. After consideration of risks, benefits and other options for treatment, the patient has consented to  Procedure(s): CLOSED REDUCTION SHOULDER (Left) as a surgical intervention.  The patient's history has been reviewed, patient examined, no change in status, stable for surgery.  I have reviewed the patient's chart and labs.  Questions were answered to the patient's satisfaction.     Marybelle Killings

## 2020-10-31 NOTE — Progress Notes (Signed)
Chaplain engaged in an initial visit with Lorraine Harris.  Chaplain offered support as Lorraine Harris needed a paper and pencil to write with.  Chaplain was able to record the information Lorraine Harris needed down on paper for her.  She wanted to know her room number, room phone number and nurses name to convey to others.  Chaplain offered ministries of presence and listening.   Lorraine Harris was thankful for chaplain's help.  She stated that she was feeling ok today.  She noted the different sounds of the machine going off were leaving her a bit bothered and how she was working to keep her arm still.   Chaplain will follow-up.     11/27/2020 1200  Clinical Encounter Type  Visited With Patient  Visit Type Initial

## 2020-10-31 NOTE — ED Triage Notes (Addendum)
Pt arrives EMS after calling out for restroom assistance. Pt from home and lives by self. Recent admission for covid + 2/5. Pt sts last couple days feeling worse, fatigued, headaches, and increased shob on exertion. Wears 2L [place] at home since last admission. Recent fall injuring left arm.

## 2020-11-01 DIAGNOSIS — J9601 Acute respiratory failure with hypoxia: Secondary | ICD-10-CM | POA: Diagnosis not present

## 2020-11-01 LAB — MAGNESIUM: Magnesium: 1.9 mg/dL (ref 1.7–2.4)

## 2020-11-01 LAB — BASIC METABOLIC PANEL
Anion gap: 13 (ref 5–15)
BUN: 32 mg/dL — ABNORMAL HIGH (ref 8–23)
CO2: 30 mmol/L (ref 22–32)
Calcium: 8 mg/dL — ABNORMAL LOW (ref 8.9–10.3)
Chloride: 85 mmol/L — ABNORMAL LOW (ref 98–111)
Creatinine, Ser: 1.33 mg/dL — ABNORMAL HIGH (ref 0.44–1.00)
GFR, Estimated: 43 mL/min — ABNORMAL LOW (ref >60)
Glucose, Bld: 121 mg/dL — ABNORMAL HIGH (ref 70–99)
Potassium: 3.7 mmol/L (ref 3.5–5.1)
Sodium: 128 mmol/L — ABNORMAL LOW (ref 135–145)

## 2020-11-01 LAB — CBC
HCT: 35.5 % — ABNORMAL LOW (ref 36.0–46.0)
Hemoglobin: 11 g/dL — ABNORMAL LOW (ref 12.0–15.0)
MCH: 26.6 pg (ref 26.0–34.0)
MCHC: 31 g/dL (ref 30.0–36.0)
MCV: 86 fL (ref 80.0–100.0)
Platelets: 217 10*3/uL (ref 150–400)
RBC: 4.13 MIL/uL (ref 3.87–5.11)
RDW: 19.5 % — ABNORMAL HIGH (ref 11.5–15.5)
WBC: 8.7 10*3/uL (ref 4.0–10.5)
nRBC: 0 % (ref 0.0–0.2)

## 2020-11-01 MED ORDER — FENTANYL CITRATE (PF) 100 MCG/2ML IJ SOLN
INTRAMUSCULAR | Status: AC
  Administered 2020-11-01: 100 ug
  Filled 2020-11-01: qty 4

## 2020-11-01 MED ORDER — PROPOFOL 500 MG/50ML IV EMUL
INTRAVENOUS | Status: AC
  Filled 2020-11-01: qty 50

## 2020-11-01 MED ORDER — FENTANYL CITRATE (PF) 100 MCG/2ML IJ SOLN: 50.0000 ug | Freq: Once | INTRAMUSCULAR | Status: DC

## 2020-11-01 MED ORDER — MIDAZOLAM HCL 2 MG/2ML IJ SOLN
4.0000 mg | Freq: Once | INTRAMUSCULAR | Status: AC
  Administered 2020-11-01: 2 mg via INTRAVENOUS

## 2020-11-01 MED ORDER — FENTANYL CITRATE (PF) 100 MCG/2ML IJ SOLN
100.0000 ug | Freq: Once | INTRAMUSCULAR | Status: AC
  Administered 2020-11-01: 50 ug via INTRAVENOUS

## 2020-11-01 MED ORDER — NOREPINEPHRINE 4 MG/250ML-% IV SOLN
INTRAVENOUS | Status: AC
  Filled 2020-11-01: qty 250

## 2020-11-01 MED ORDER — MIDAZOLAM HCL 2 MG/2ML IJ SOLN
INTRAMUSCULAR | Status: AC
  Filled 2020-11-01: qty 4

## 2020-11-01 MED ORDER — AMIODARONE HCL IN DEXTROSE 360-4.14 MG/200ML-% IV SOLN
60.0000 mg/h | INTRAVENOUS | Status: DC
  Administered 2020-11-01: 60 mg/h via INTRAVENOUS
  Filled 2020-11-01: qty 200

## 2020-11-01 MED ORDER — METOPROLOL TARTRATE 5 MG/5ML IV SOLN
5.0000 mg | Freq: Once | INTRAVENOUS | Status: AC
  Administered 2020-11-01: 5 mg via INTRAVENOUS
  Filled 2020-11-01: qty 5

## 2020-11-01 MED ORDER — POTASSIUM CHLORIDE 10 MEQ/100ML IV SOLN: 10.0000 meq | INTRAVENOUS | Status: AC

## 2020-11-01 MED ORDER — EPINEPHRINE 1 MG/10ML IJ SOSY
PREFILLED_SYRINGE | INTRAMUSCULAR | Status: AC
  Filled 2020-11-01: qty 20

## 2020-11-01 MED ORDER — RIVAROXABAN 20 MG PO TABS: 20.0000 mg | ORAL_TABLET | Freq: Every day | ORAL | Status: DC

## 2020-11-01 MED FILL — Medication: Qty: 1 | Status: AC

## 2020-11-03 ENCOUNTER — Encounter (HOSPITAL_COMMUNITY): Payer: Self-pay | Admitting: Orthopaedic Surgery

## 2020-11-21 ENCOUNTER — Ambulatory Visit: Payer: Medicare HMO | Admitting: Cardiology

## 2020-12-01 NOTE — CV Procedure (Addendum)
Procedure performed: Synchronized Cardioversion for Atrial Fibrillation, Symptomatic.  Lorraine Harris is a 69 y.o. with morbid obesity, H/O non ischemic cardiomyopathy with normal coronary arteries by angio on 09/20/2014, EF 40%, LBBB, HTN, bronchial asthma, and depression. PAF found in August 2020 after presenting with symptoms of angina, mildly abnormal nuclear stress test in 2020. She has chronic shortness breath and dyspnea on exertion due to obesity and also prior history of tobacco use which she has quit. She has been unable to tolerate Entresto due to soft blood pressure and is tolerating metoprolol.  She was admitted to the hospital on 10/06/2020 and discharged a week later with A. fib with RVR and acute decompensated heart failure and cellulitis of her leg. She also had Covid pneumonia.  I had seen her a week ago with significant leg edema, continued A. fib with RVR, but no significant dyspnea, I had anticipated that she was not in acute decompensated heart failure but generally food and volume overloaded due to essentially being wheelchair-bound and bedbound especially in the lower extremity.    Since hospital discharge she has had difficulty in getting along, difficulty with ambulation and gait.  She decided to come back to the emergency room but had a fall while trying to get on the wheelchair, since then has been having severe pain in her right shoulder as well.  Patient continue to be in A. fib with RVR, unable to tolerate diltiazem or any other negative chronotropic agents due to low blood pressure, unable to diurese due to low BP, failure to thrive, heart rate around 120s to 140 bpm in spite of IV Amiodarone and Dig on board.  In view of cardiomyopathy, acute on chronic systolic heart failure, inability to fluid mobilize and A. Fib with RVR, I feel she has symptomatic atrial fibrillation and felt she could benefit from digoxin cardioversion.  I discussed this with the patient  regarding proceeding with direct-current cardioversion and ask for help from his dyspnea to sedate her, moderate sedation with patient 2 mg of Versed and 50 mcg of fentanyl  Patient received synchronized electrocardioversion with 200 J x 3 medications with unsuccessful attempt to cardiovert her.  However she developed persistent Lieff following cardioversion, continued with CPR with initiated.  ACLS protocol was followed, in spite of this, patient continued to be in sustained VF, very transiently intubated her to protect her airway and multiple interiors defibrillation shocks were delivered after giving another 150 mg of intravenous amiodarone, she also received bicarbonate and is seen in the left epinephrine.  After performing CPR for about 8 to 10 minutes, we decided that it was futile as she had persistent continue VF.  Hence CPR was discontinued and patient was pronounced dead at 9:20 AM on 11/15/2020.   Adrian Prows, MD, [facility] 15-Nov-2020, 9:37 AM Office: [phone] Pager: [phone]

## 2020-12-01 NOTE — Death Summary Note (Signed)
Death Summary  Lorraine Harris PPI:[id number] DOB: [birth year] DOA: 2020-11-25  PCP: Everardo Beals, NP  Admit date: 2020-11-25 Date of Death: November 26, 2020 Time of Death: 9:20 AM  notification: Everardo Beals, NP notified of death of 11-26-20   History of present illness:  Lorraine Harris is a 70 y.o. female with a history of morbid obesity O2 dependent, wheelchair-bound and bedbound admitted status post fall while transferring with left shoulder anterior dislocation status post closed reduction by Dr. Lorin Mercy.  Patient went into rapid A. fib sustained, found to have hypercapnic respiratory failure was placed on BiPAP and amiodarone drip.  She continued to have A. fib RVR. In addition to this she has history of nonischemic cardiomyopathy, hypertension, asthma, depression and Covid pneumonia in February 2022. Patient remained in A. fib RVR in spite of IV amiodarone and digoxin with volume overload positive fluid balance and unable to diurese due to low blood pressure.  She had DC cardioversion by cardiology with support of PCCM respiratory therapist and dedicated nurse.  Patient developed V. fib arrest requiring CPR, she was intubated received epinephrine and was shocked x3.  She did not survive the code.  Dr. Einar Gip discussed with her son.    Final Diagnoses:  1.   V. fib arrest secondary to cardiomyopathy and persistent atrial fibrillation 2 acute on chronic systolic heart failure 3 pulmonary edema/obesity hypoventilation/COPD/tobacco abuse 4 stage IIIb CKD   The results of significant diagnostics from this hospitalization (including imaging, microbiology, ancillary and laboratory) are listed below for reference.    Significant Diagnostic Studies: DG Shoulder 1V Left  Result Date: November 25, 2020 CLINICAL DATA:  Post reduction EXAM: LEFT SHOULDER COMPARISON:  2020-11-25 FINDINGS: Humeral head appears to articulate with the glenoid on single view of the shoulder. No definitive displaced  fracture. Hill-Sachs deformity better seen on the prior exam. IMPRESSION: Reduction of previously noted humeral head dislocation. Electronically Signed   By: Donavan Foil M.D.   On: 2020/11/25 20:08   CT HEAD WO CONTRAST  Result Date: 2020/11/25 CLINICAL DATA:  Right lower extremity weakness.  Headache.  Fatigue EXAM: CT HEAD WITHOUT CONTRAST TECHNIQUE: Contiguous axial images were obtained from the base of the skull through the vertex without intravenous contrast. COMPARISON:  None. FINDINGS: Brain: There is atrophy and chronic small vessel disease changes. No acute intracranial abnormality. Specifically, no hemorrhage, hydrocephalus, mass lesion, acute infarction, or significant intracranial injury. Vascular: No hyperdense vessel or unexpected calcification. Skull: No acute calvarial abnormality. Sinuses/Orbits: No acute abnormality. Other: None IMPRESSION: Atrophy, chronic microvascular disease. No acute intracranial abnormality. Electronically Signed   By: Rolm Baptise M.D.   On: 11/25/2020 08:47   CT Angio Chest PE W/Cm &/Or Wo Cm  Result Date: 10/06/2020 CLINICAL DATA:  70 year old female with concern for pulmonary embolism. EXAM: CT ANGIOGRAPHY CHEST WITH CONTRAST TECHNIQUE: Multidetector CT imaging of the chest was performed using the standard protocol during bolus administration of intravenous contrast. Multiplanar CT image reconstructions and MIPs were obtained to evaluate the vascular anatomy. CONTRAST:  155mL OMNIPAQUE IOHEXOL 350 MG/ML SOLN COMPARISON:  Chest radiograph dated 03/05/2019. FINDINGS: Evaluation is limited due to streak artifact caused by patient's arms. Cardiovascular: There is mild cardiomegaly. No pericardial effusion. Coronary vascular calcification and calcification of the mitral annulus. There is retrograde flow of contrast from the right atrium into the IVC suggestive of a degree of right heart dysfunction. There is mild atherosclerotic calcification of the thoracic aorta. The  aorta is tortuous. Mild dilatation of the main pulmonary trunk suggestive  of pulmonary hypertension. Clinical correlation is recommended. Evaluation of the pulmonary arteries is limited due to respiratory motion artifact. No large or central pulmonary artery embolus identified. Mediastinum/Nodes: No hilar or mediastinal adenopathy. The esophagus is grossly unremarkable. No mediastinal fluid collection. Lungs/Pleura: Diffuse bilateral streaky and interstitial densities may represent atelectasis or atypical infection. Clinical correlation is recommended. No pleural effusion or pneumothorax. The central airways are patent. Upper Abdomen: Indeterminate 2.5 cm right renal upper pole hypodense lesion corresponding to the cyst seen on the prior CT. Musculoskeletal: Degenerative changes of the spine. No acute osseous pathology. Review of the MIP images confirms the above findings. IMPRESSION: 1. No CT evidence of central pulmonary artery embolus. 2. Mild cardiomegaly with findings of right heart dysfunction. Clinical correlation is recommended. 3. Diffuse bilateral streaky and interstitial densities may represent atelectasis or atypical infection. Clinical correlation is recommended. 4. Aortic Atherosclerosis (ICD10-I70.0). Electronically Signed   By: Anner Crete M.D.   On: 10/06/2020 22:55   DG Chest Portable 1 View  Result Date: 11/24/2020 CLINICAL DATA:  Dyspnea EXAM: PORTABLE CHEST 1 VIEW COMPARISON:  03/05/2019 FINDINGS: The lung bases are excluded on this lordotic, semi-erect radiograph. Despite this, perihilar and lower lung zone pulmonary infiltrates are present, most in keeping with mild to moderate cardiogenic failure. No pneumothorax. No definite pleural effusion. Cardiac size is enlarged, though not fully included on this examination. IMPRESSION: Mild to moderate cardiogenic failure. Electronically Signed   By: Fidela Salisbury MD   On: 11/15/2020 05:19   DG Shoulder Left Port  Result Date:  11/22/2020 CLINICAL DATA:  Recent fall with left shoulder pain, initial encounter EXAM: LEFT SHOULDER COMPARISON:  None. FINDINGS: Anterior inferior dislocation of the humeral head is noted with respect to the glenoid. Hill-Sachs deformity is noted within the humeral head. No definitive fracture is seen. No soft tissue abnormality is noted. IMPRESSION: Anterior inferior dislocation of the left humeral head. Electronically Signed   By: Inez Catalina M.D.   On: 11/07/2020 09:12   VAS [place] LOWER EXTREMITY VENOUS (DVT)  Result Date: 10/08/2020  Lower Venous DVT Study Other Indications: Covid. Limitations: Body habitus. Comparison Study: No previous exam Performing Technologist: Vonzell Schlatter  Examination Guidelines: A complete evaluation includes B-mode imaging, spectral Doppler, color Doppler, and power Doppler as needed of all accessible portions of each vessel. Bilateral testing is considered an integral part of a complete examination. Limited examinations for reoccurring indications may be performed as noted. The reflux portion of the exam is performed with the patient in reverse Trendelenburg.  +---------+---------------+---------+-----------+----------+--------------+ RIGHT    CompressibilityPhasicitySpontaneityPropertiesThrombus Aging +---------+---------------+---------+-----------+----------+--------------+ CFV      Full           Yes      Yes                                 +---------+---------------+---------+-----------+----------+--------------+ SFJ      Full                                                        +---------+---------------+---------+-----------+----------+--------------+ FV Prox  Full                                                        +---------+---------------+---------+-----------+----------+--------------+  FV Mid   Full                                                        +---------+---------------+---------+-----------+----------+--------------+ FV  DistalFull                                                        +---------+---------------+---------+-----------+----------+--------------+ PFV      Full                                                        +---------+---------------+---------+-----------+----------+--------------+ POP      Full           Yes      Yes                                 +---------+---------------+---------+-----------+----------+--------------+ PTV      Full                                                        +---------+---------------+---------+-----------+----------+--------------+ PERO     Full                                                        +---------+---------------+---------+-----------+----------+--------------+   +---------+---------------+---------+-----------+----------+--------------+ LEFT     CompressibilityPhasicitySpontaneityPropertiesThrombus Aging +---------+---------------+---------+-----------+----------+--------------+ CFV      Full           Yes      Yes                                 +---------+---------------+---------+-----------+----------+--------------+ SFJ      Full                                                        +---------+---------------+---------+-----------+----------+--------------+ FV Prox  Full                                                        +---------+---------------+---------+-----------+----------+--------------+ FV Mid   Full                                                        +---------+---------------+---------+-----------+----------+--------------+  FV DistalFull                                                        +---------+---------------+---------+-----------+----------+--------------+ PFV      Full                                                        +---------+---------------+---------+-----------+----------+--------------+ POP      Full           Yes      Yes                                  +---------+---------------+---------+-----------+----------+--------------+ PTV      Full                                                        +---------+---------------+---------+-----------+----------+--------------+ PERO     Full                                                        +---------+---------------+---------+-----------+----------+--------------+     Summary: BILATERAL: - No evidence of deep vein thrombosis seen in the lower extremities, bilaterally. -No evidence of popliteal cyst, bilaterally.   *See table(s) above for measurements and observations. Electronically signed by Monica Martinez MD on 10/08/2020 at 2:32:49 PM.    Final    ECHOCARDIOGRAM LIMITED  Result Date: 10/09/2020    ECHOCARDIOGRAM LIMITED REPORT   Patient Name:   Lorraine Harris Date of Exam: 10/09/2020 Medical Rec #:  [id number]           Height:       64.0 in Accession #:    [id number]          Weight:       321.4 lb Date of Birth:  02-Feb-1951            BSA:          2.393 m Patient Age:    35 years            BP:           90/76 mmHg Patient Gender: F                   HR:           118 bpm. Exam Location:  Inpatient Procedure: Limited Echo, Limited Color Doppler, Cardiac Doppler and Intracardiac            Opacification Agent Indications:     CHF-Acute Systolic P91.50  History:         Patient has prior history of Echocardiogram examinations, most                  recent 04/22/2019. Arrythmias:Atrial Fibrillation; Risk  Factors:Hypertension and Dyslipidemia. COVID 19.  Sonographer:     Darlina Sicilian RDCS Referring Phys:  [id number] [place] Diagnosing Phys: Adrian Prows MD  Sonographer Comments: Suboptimal parasternal window and Technically difficult study due to poor echo windows. IMPRESSIONS  1. Left ventricular ejection fraction, by estimation, is 35 to 40%. Left ventricular ejection fraction by 2D MOD biplane is 42.7 %. The left ventricle has mild to moderately  decreased function. The left ventricle demonstrates global hypokinesis. Left ventricular diastolic parameters are indeterminate.  2. Right ventricular systolic function was not well visualized. There is moderately elevated pulmonary artery systolic pressure. The estimated right ventricular systolic pressure is 57.3 mmHg.  3. Left atrial size was moderately dilated.  4. The mitral valve is grossly normal. Trivial mitral valve regurgitation.  5. The aortic valve is calcified. Aortic valve regurgitation is not visualized. Mild aortic valve stenosis.  6. The inferior vena cava is dilated in size with <50% respiratory variability, suggesting right atrial pressure of 15 mmHg. Comparison(s): Compared to 04/22/2019, no significant change in EF 35-40%$. FINDINGS  Left Ventricle: Left ventricular ejection fraction, by estimation, is 35 to 40%. Left ventricular ejection fraction by 2D MOD biplane is 42.7 %. The left ventricle has mild to moderately decreased function. The left ventricle demonstrates global hypokinesis. Definity contrast agent was given IV to delineate the left ventricular endocardial borders. The left ventricular internal cavity size was normal in size. There is no left ventricular hypertrophy. Abnormal (paradoxical) septal motion, consistent with left bundle branch block. Left ventricular diastolic parameters are indeterminate. Right Ventricle: Right ventricular systolic function was not well visualized. There is moderately elevated pulmonary artery systolic pressure. The tricuspid regurgitant velocity is 2.65 m/s, and with an assumed right atrial pressure of 15 mmHg, the estimated right ventricular systolic pressure is 22.0 mmHg. Left Atrium: Left atrial size was moderately dilated. Right Atrium: Right atrial size was not well visualized. Pericardium: There is no evidence of pericardial effusion. Mitral Valve: The mitral valve is grossly normal. Trivial mitral valve regurgitation. Tricuspid Valve: The  tricuspid valve is grossly normal. Tricuspid valve regurgitation is mild. Aortic Valve: The aortic valve is calcified. Aortic valve regurgitation is not visualized. Mild aortic stenosis is present. Aortic valve mean gradient measures 6.5 mmHg. Aortic valve peak gradient measures 12.7 mmHg. Aortic valve area, by VTI measures 1.32 cm. Pulmonic Valve: The pulmonic valve was not well visualized. Pulmonic valve regurgitation is not visualized. Aorta: The aortic root is normal in size and structure. Venous: The inferior vena cava is dilated in size with less than 50% respiratory variability, suggesting right atrial pressure of 15 mmHg. IAS/Shunts: No atrial level shunt detected by color flow Doppler. LEFT VENTRICLE PLAX 2D                        Biplane EF (MOD) LVOT diam:     1.80 cm         LV Biplane EF:   Left LV SV:         48                               ventricular LV SV Index:   20                               ejection LVOT Area:     2.54 cm  fraction by                                                 2D MOD                                                 biplane is LV Volumes (MOD)                                42.7 %. LV vol d, MOD    161.0 ml A2C: LV vol d, MOD    194.0 ml A4C: LV vol s, MOD    92.8 ml A2C: LV vol s, MOD    113.0 ml A4C: LV SV MOD A2C:   68.2 ml LV SV MOD A4C:   194.0 ml LV SV MOD BP:    77.9 ml AORTIC VALVE AV Area (Vmax):    1.58 cm AV Area (Vmean):   1.63 cm AV Area (VTI):     1.32 cm AV Vmax:           178.50 cm/s AV Vmean:          119.000 cm/s AV VTI:            0.366 m AV Peak Grad:      12.7 mmHg AV Mean Grad:      6.5 mmHg LVOT Vmax:         111.00 cm/s LVOT Vmean:        76.400 cm/s LVOT VTI:          0.190 m LVOT/AV VTI ratio: 0.52  AORTA Ao Root diam: 2.80 cm TRICUSPID VALVE TR Peak grad:   28.1 mmHg TR Vmax:        265.00 cm/s  SHUNTS Systemic VTI:  0.19 m Systemic Diam: 1.80 cm Adrian Prows MD Electronically signed by Adrian Prows MD Signature Date/Time:  10/09/2020/1:14:46 PM    Final     Microbiology: Recent Results (from the past 240 hour(s))  MRSA PCR Screening     Status: None   Collection Time: 11/16/2020  9:19 AM   Specimen: Nasal Mucosa; Nasopharyngeal  Result Value Ref Range Status   MRSA by PCR NEGATIVE NEGATIVE Final    Comment:        The GeneXpert MRSA Assay (FDA approved for NASAL specimens only), is one component of a comprehensive MRSA colonization surveillance program. It is not intended to diagnose MRSA infection nor to guide or monitor treatment for MRSA infections. Performed at [facility], [place] [address], [place], [place] [address]   Resp Panel by RT-PCR (Flu A&B, Covid) Nasopharyngeal Swab     Status: Abnormal   Collection Time: 11/07/2020  1:41 PM   Specimen: Nasopharyngeal Swab; Nasopharyngeal(NP) swabs in vial transport medium  Result Value Ref Range Status   SARS Coronavirus 2 by RT PCR POSITIVE (A) NEGATIVE Final    Comment: RESULT CALLED TO, READ BACK BY AND VERIFIED WITH: MELTON,A RN @1432  ON 11/06/2020 JACKSON,K    Influenza A by PCR NEGATIVE NEGATIVE Final   Influenza B by PCR NEGATIVE NEGATIVE Final    Comment: Performed at [facility],  [place] [address] [place], [place] [address]     Labs: Basic Metabolic Panel: Recent Labs  Lab 11/29/2020 0424 2020/11/04 0237  NA 127* 128*  K 3.6 3.7  CL 85* 85*  CO2 27 30  GLUCOSE 130* 121*  BUN 27* 32*  CREATININE 1.77* 1.33*  CALCIUM 8.2* 8.0*  MG 1.5* 1.9   Liver Function Tests: Recent Labs  Lab 11/28/2020 0424  AST 26  ALT 16  ALKPHOS 60  BILITOT 1.0  PROT 6.2*  ALBUMIN 3.0*   No results for input(s): LIPASE, AMYLASE in the last 168 hours. No results for input(s): AMMONIA in the last 168 hours. CBC: Recent Labs  Lab 11/06/2020 0424 11-04-20 0237  WBC 10.3 8.7  NEUTROABS 8.7*  --   HGB 11.1* 11.0*  HCT 35.8* 35.5*  MCV 85.4 86.0  PLT 256 217   Cardiac Enzymes: No results for input(s): CKTOTAL, CKMB,  CKMBINDEX, TROPONINI in the last 168 hours. D-Dimer No results for input(s): DDIMER in the last 72 hours. BNP: Invalid input(s): POCBNP CBG: No results for input(s): GLUCAP in the last 168 hours. Anemia work up No results for input(s): VITAMINB12, FOLATE, FERRITIN, TIBC, IRON, RETICCTPCT in the last 72 hours. Urinalysis    Component Value Date/Time   COLORURINE YELLOW 06/19/2013 0304   APPEARANCEUR CLEAR 06/19/2013 0304   LABSPEC 1.032 (H) 06/19/2013 0304   PHURINE 5.0 06/19/2013 0304   GLUCOSEU NEGATIVE 06/19/2013 0304   HGBUR NEGATIVE 06/19/2013 0304   BILIRUBINUR NEGATIVE 06/19/2013 0304   KETONESUR NEGATIVE 06/19/2013 0304   PROTEINUR NEGATIVE 06/19/2013 0304   UROBILINOGEN 0.2 06/19/2013 0304   NITRITE NEGATIVE 06/19/2013 0304   LEUKOCYTESUR NEGATIVE 06/19/2013 0304   Sepsis Labs Invalid input(s): PROCALCITONIN,  WBC,  LACTICIDVEN     SIGNED:  Georgette Shell, MD  [facility] 11-04-2020, 3:06 PM

## 2020-12-01 NOTE — Progress Notes (Signed)
Assisted with conscious sedation for Lorraine Harris  Dedicated RN present to recover patient  Patient received 50 mcg of fentanyl and 1 mg of Versed initially Patient was still awake and interactive Received 1 more milligram of Versed for conscious sedation  Remained in with dynamically stable  She was DC cardioverted x3  Did receive another 50 mcg of fentanyl for chest discomfort   The patient did go into V. fib arrest and CPR was initiated Patient remained persistently in V. fib arrest despite optimal resuscitative efforts with medications and shocks  Code was called and the patient was pronounced by Dr. Einar Gip

## 2020-12-01 NOTE — Discharge Summary (Addendum)
Death Summary  Lorraine Harris is a 70 y.o. female patient with morbid obesity, H/O non ischemic cardiomyopathy with normal coronary arteries by angio on 09/20/2014, EF 40%, LBBB, HTN, bronchial asthma, and depression. PAF found in August 2020 after presenting with symptoms of angina, mildly abnormal nuclear stress test in 2020. She has chronic shortness breath and dyspnea on exertion due to obesity and also prior history of tobacco use which she has quit. She has been unable to tolerate Entresto due to soft blood pressure and is tolerating metoprolol.  She was admitted to the hospital on 10/06/2020 and discharged a week later with A. fib with RVR and acute decompensated heart failure and cellulitis of her leg. She also had Covid pneumonia.  I had seen her a week ago with significant leg edema, continued A. fib with RVR, but no significant dyspnea, I had anticipated that she was not in acute decompensated heart failure but generally food and volume overloaded due to essentially being wheelchair-bound and bedbound especially in the lower extremity.     Since hospital discharge she has had difficulty in getting along, difficulty with ambulation and gait.  She decided to come back to the emergency room but had a fall while trying to get on the wheelchair, since then has been having severe pain in her right shoulder as well.  I saw the patient yesterday, she persisted to be in A. fib with RVR, however unable to tolerate any negative inotropic agents or diltiazem due to hypotension.  I started her on IV amiodarone and digoxin, but in spite of this she is persistently in atrial fibrillation.  She continued to be volume overloaded, with positive fluid balance and unable to diurese her due to low blood pressure.  Yesterday she did undergo left shoulder dislocation reduction without any complications under general anesthesia.  This morning she reports to be in atrial fibrillation, continues to have  generalized fluid overload state, shortness of breath and immobility.  In view of this, I felt that direct-current cardioversion will help to maintain systemic pressure, will be able to wean her off IV amiodarone.  After obtaining informed consent I performed direct-current cardioversion.  This was done with the help of pulmonary critical care and respiratory therapist to assist me with the airway.  After 3 defibrillation shocks at 200 J, patient developed VF arrest needing CPR.  Unfortunately patient continued to remain in VF and be followed ACLS protocol including endotracheal intubation for airway protection but was unsuccessful and hence I called after called and she was pronounced dead on 11-12-2020 at 9:20 AM.  I discussed the events with her son Mr. Sheria Lang, explained to him regarding her poor overall health, that we were already considering hospice/palliative care, cardioversion was performed with the hopes of improving her hemodynamics and unfortunately developed VF arrest in view of underlying severe cardiomyopathy.  Patient was thankful for the care provided to his mother, he understood that she had multiple medical comorbidity.  He would like to come visit her at the bedside.   Cause of death:  1. VF arrest secondary to cardiomyopathy and persistent atrial fibrillation 2.  Acute on chronic systolic heart failure 3.  Shortness of breath related to pulmonary edema and underlying obesity hypoventilation, underlying COPD and prior history of tobacco use 4.  Cellulitis bilateral lower extremity left appears worse 5.  Stage IIIb chronic kidney disease 6. Hyponatremia due to volume excess and CHF.  For complete hospital course please see history and physical and  progress notes.   Adrian Prows, MD, [facility] 11/19/2020, 10:18 AM Office: [phone] Pager: [phone]

## 2020-12-01 NOTE — Progress Notes (Signed)
Patient ID: Lorraine Harris, female   DOB: June 23, 1951, 70 y.o.   MRN: [id number]  Leave shoulder immobilizer on to prevent recurrent shoulder dislocation. She can follow up with me in 2 wks. With other medical problems would skip OT for her UE for now . Best to have less pain since she is already dyspneic and on O2 and pain meds may increase her shortness of breath.   Follow up with me in 2 wks. Placed in discharge section.

## 2020-12-01 NOTE — Progress Notes (Signed)
Chaplain providing support with extended family at bedside following pt death.     Provided grief support in narrative review, naming and honoring values and faith tradition of patient and family, normalizing emotional response and providing space for processing loss.  Provided prayers with family at their request.

## 2020-12-01 NOTE — Procedures (Signed)
Intubation Procedure Note  Lorraine Harris  [id number]  03/15/1951  Date:2020-11-04  Time:10:06 AM   Provider Performing:[place] Alfredo Martinez, NP-C, AGACNP-BC   Procedure: Intubation (31500)  Indication(s) Acute Hypoxic Respiratory Failure, VF Arrest   Consent Unable to obtain consent due to emergent nature of procedure.   Anesthesia No sedation for intubation, CPR in progress.    Time Out Verified patient identification, verified procedure, site/side was marked, verified correct patient position, special equipment/implants available, medications/allergies/relevant history reviewed, required imaging and test results available.   Sterile Technique Usual hand hygeine, masks, and gloves were used   Procedure Description Patient positioned in bed supine.  Sedation given as noted above.  Patient was intubated with endotracheal tube using Glidescope.  View was Grade 1 full glottis .  Number of attempts was 1.  Colorimetric CO2 detector was consistent with tracheal placement. Commercial tube holder applied.    Complications/Tolerance None; patient tolerated the procedure well.  Code Called per primary Cardiologist, ETT removed post arrest efforts.    EBL None  Specimen(s) None   Lorraine Gens, MSN, APRN, NP-C, AGACNP-BC  Pulmonary & Critical Care Nov 04, 2020, 10:08 AM   Please see [email or web address] for pager details.   From 7A-7P if no response, please call [phone] After hours, please call ELink [phone]

## 2020-12-01 NOTE — Progress Notes (Signed)
Patient is awake, alert and conversant, requesting to be off BIPAP. Annaliza RT made aware, placed to 3L/[place] saturating at 94-100%. Opyd MD updated/made aware. Will continue to monitor.

## 2020-12-01 NOTE — Progress Notes (Signed)
RT NOTE:  Pt intubated during CPR, 8 mins of CPR performed, code was called by MD, ETT was pulled to give pt comfort O2.

## 2020-12-01 DEATH — deceased

## 2021-11-15 IMAGING — CT CT HEAD W/O CM
3 series · 16 of 47 positions shown, 19 images · non-contrast
Comparison: None.

CLINICAL DATA: Right lower extremity weakness.  Headache.  Fatigue

EXAM:
CT HEAD WITHOUT CONTRAST
TECHNIQUE: Contiguous axial images were obtained from the base of the skull
through the vertex without intravenous contrast.

[Series 2: head wo · axial · 0.47mm/px · z∈[-312,-182]mm · 10 of 32 slices shown, 13 images]
[im 3/32  brain]
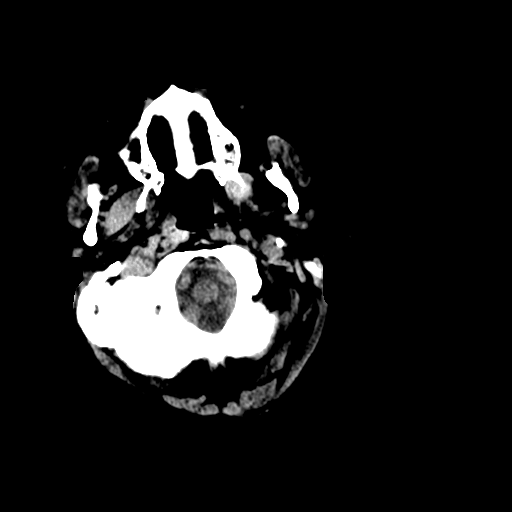
[im 3/32  bone]
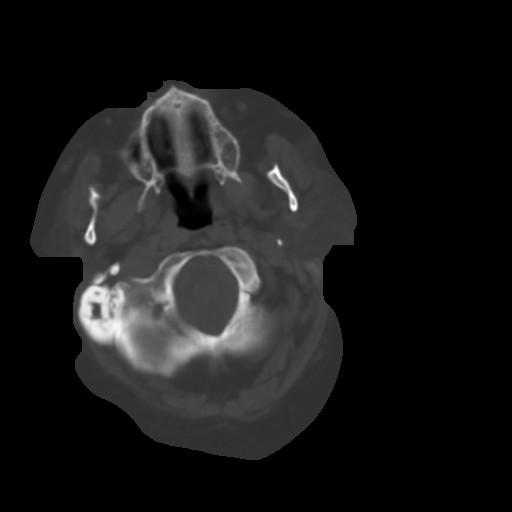
[im 6/32  brain]
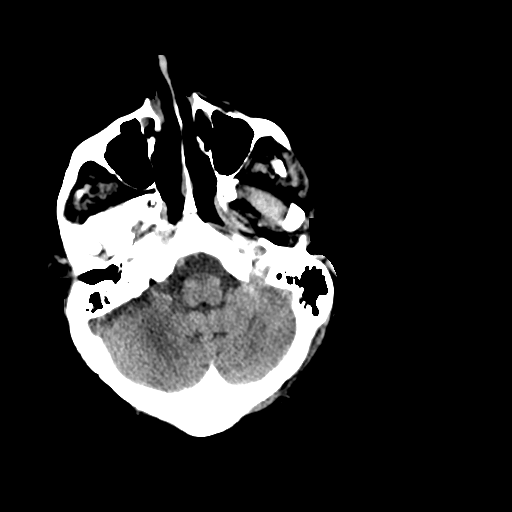
[im 9/32  brain]
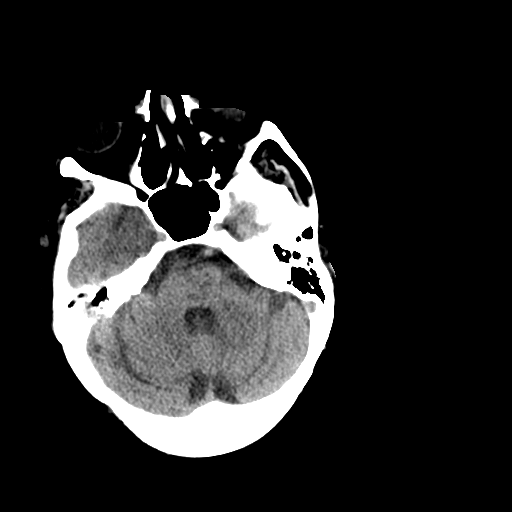
[im 11/32  brain]
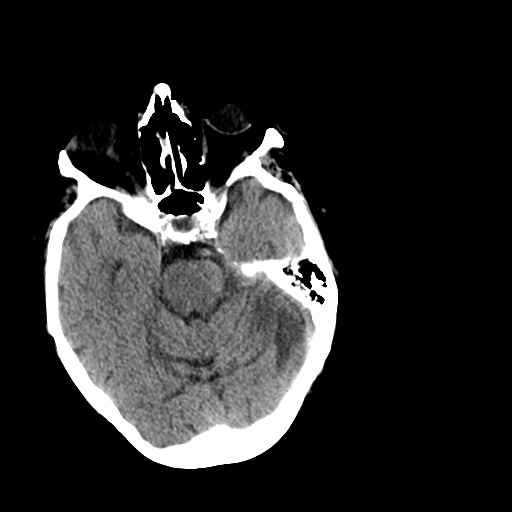
[im 14/32  brain]
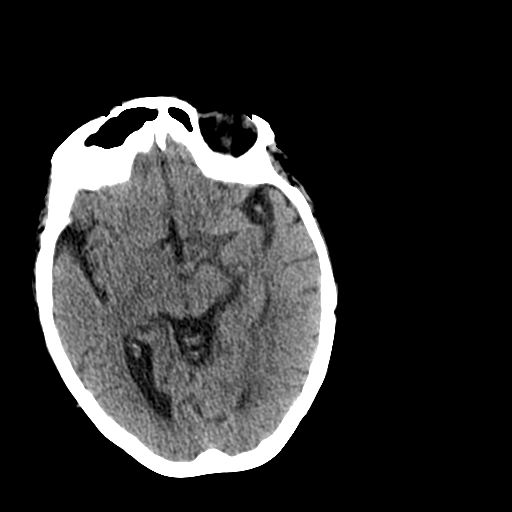
[im 14/32  bone]
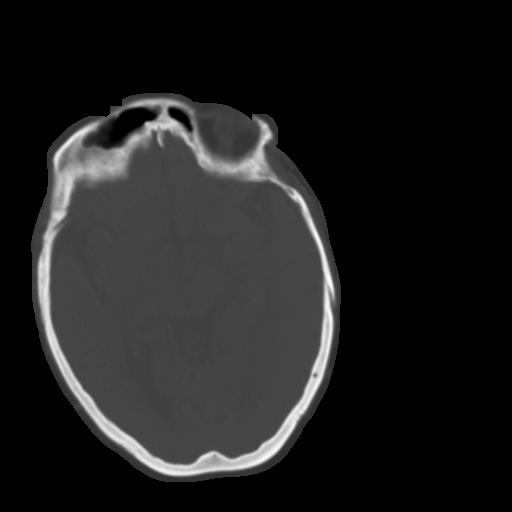
[im 18/32  brain]
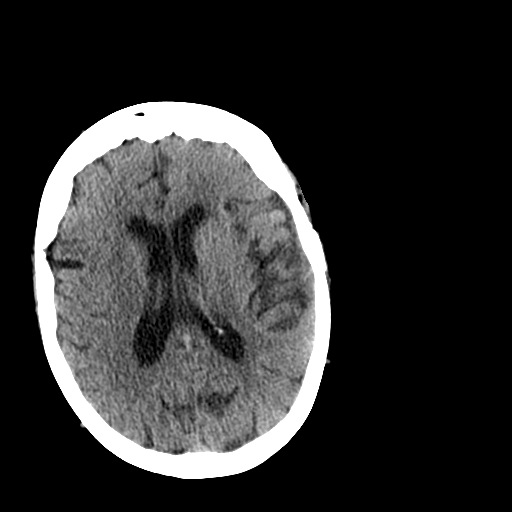
[im 21/32  brain]
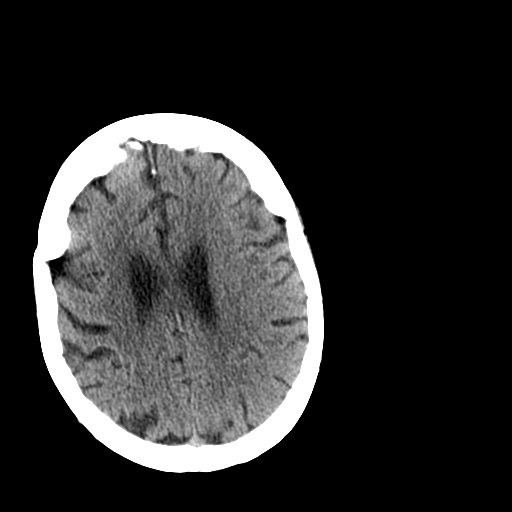
[im 24/32  brain]
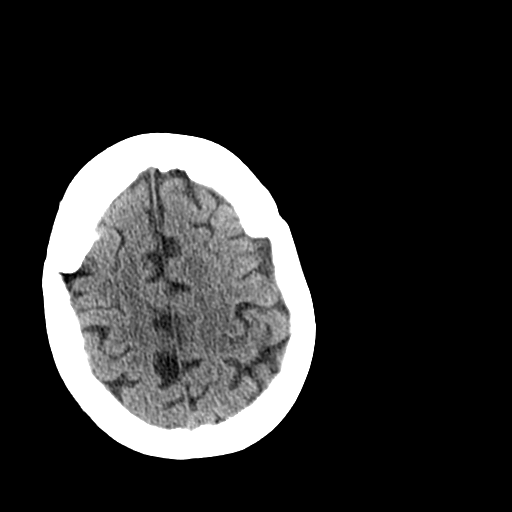
[im 26/32  brain]
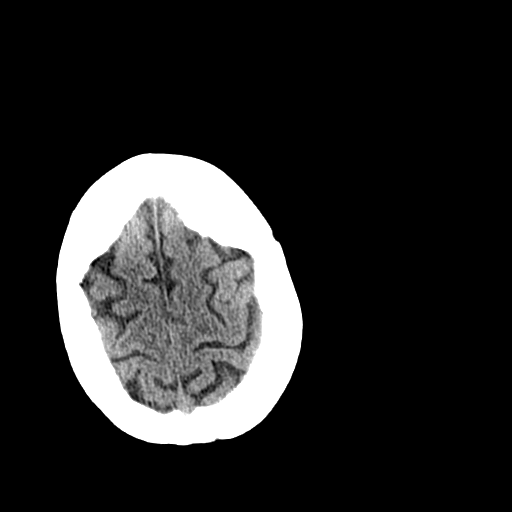
[im 26/32  bone]
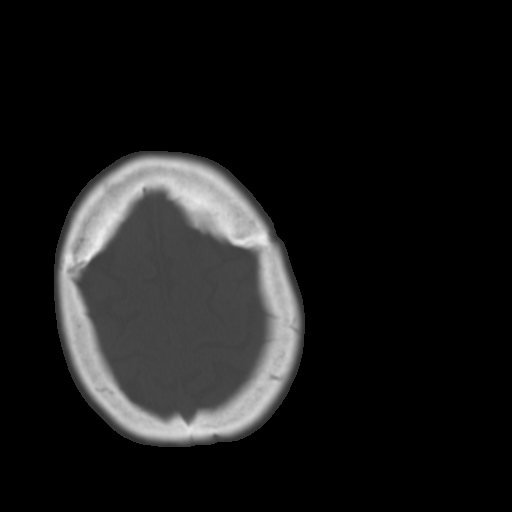
[im 29/32  brain]
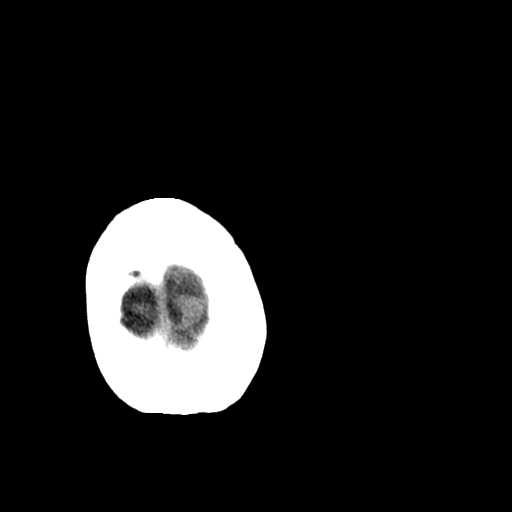

[Series 4: coronal soft tissue · coronal · 0.29mm/px · 3 of 62 slices shown]
[im 21/62  brain]
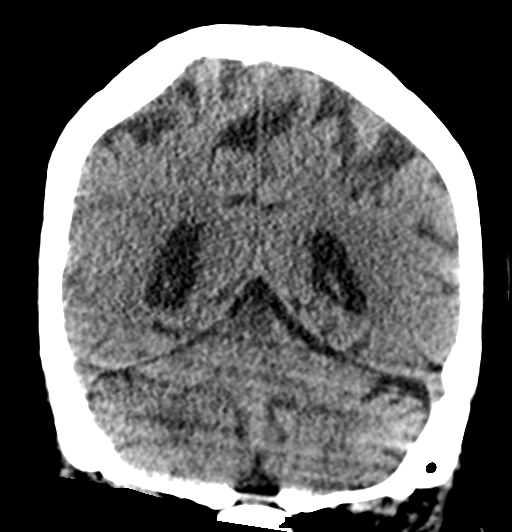
[im 28/62  brain]
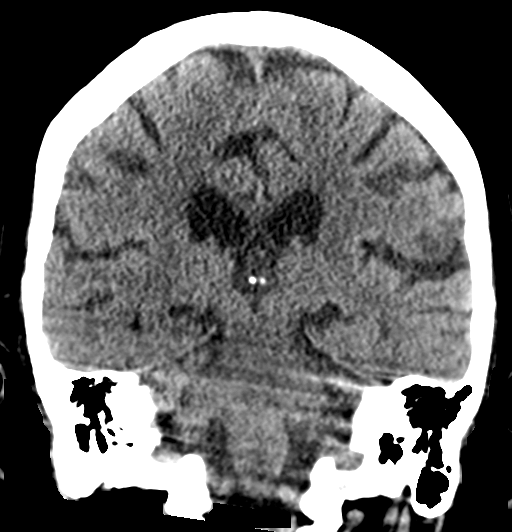
[im 34/62  brain]
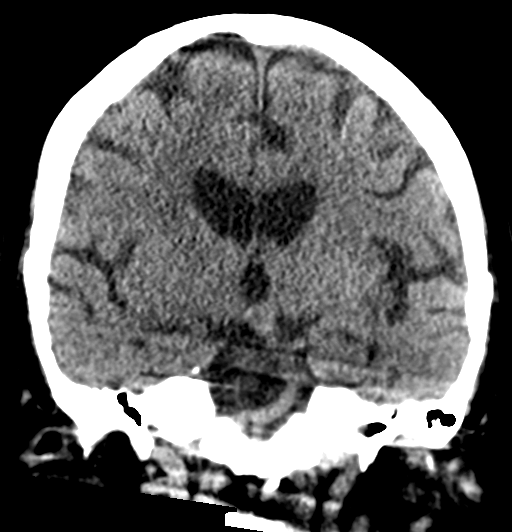

[Series 5: sagittal soft tissue · sagittal · 0.30mm/px · 3 of 51 slices shown]
[im 19/51  brain]
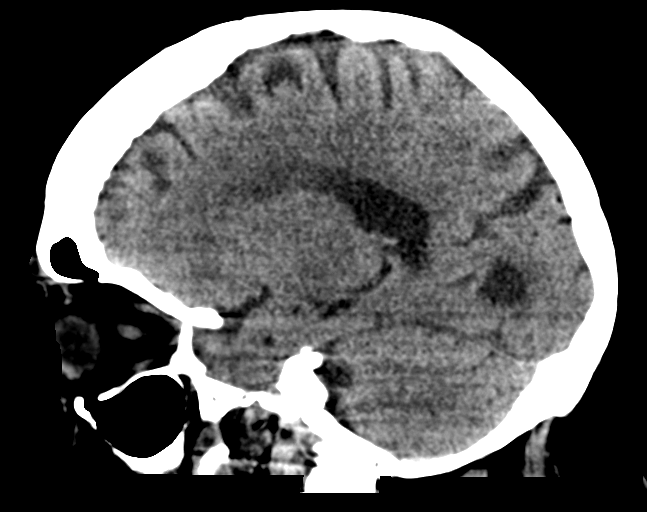
[im 26/51  brain]
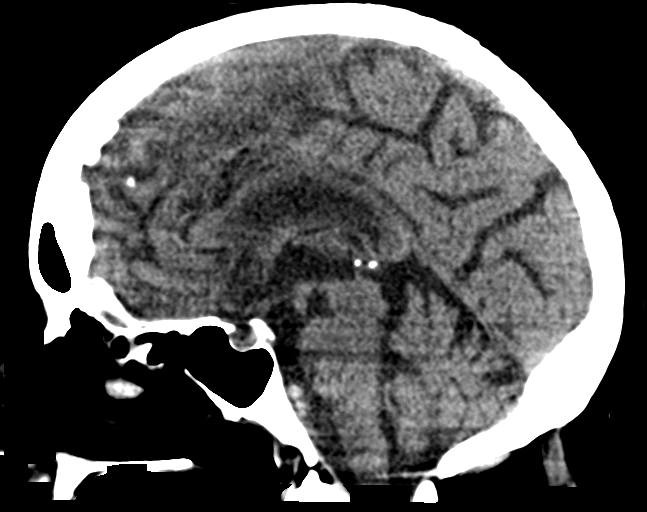
[im 32/51  brain]
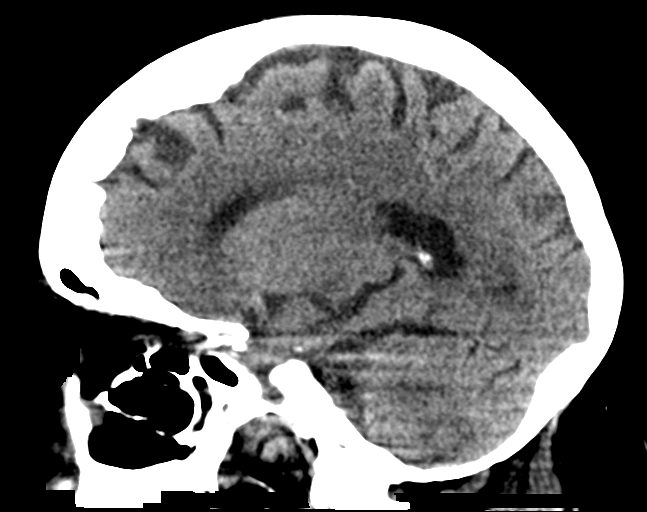

[16 of 47 positions shown; findings below may reference images not displayed]

FINDINGS: Brain: There is atrophy and chronic small vessel disease changes. No
acute intracranial abnormality. Specifically, no hemorrhage,
hydrocephalus, mass lesion, acute infarction, or significant
intracranial injury.

Vascular: No hyperdense vessel or unexpected calcification.

Skull: No acute calvarial abnormality.

Sinuses/Orbits: No acute abnormality.

Other: None
IMPRESSION: Atrophy, chronic microvascular disease.

No acute intracranial abnormality.

## 2021-11-15 IMAGING — DX DG SHOULDER 1V*L*
2 series · 2 of 2 positions shown · non-contrast
Comparison: None.

CLINICAL DATA: Recent fall with left shoulder pain, initial
encounter

EXAM:
LEFT SHOULDER

[shoulder ap]
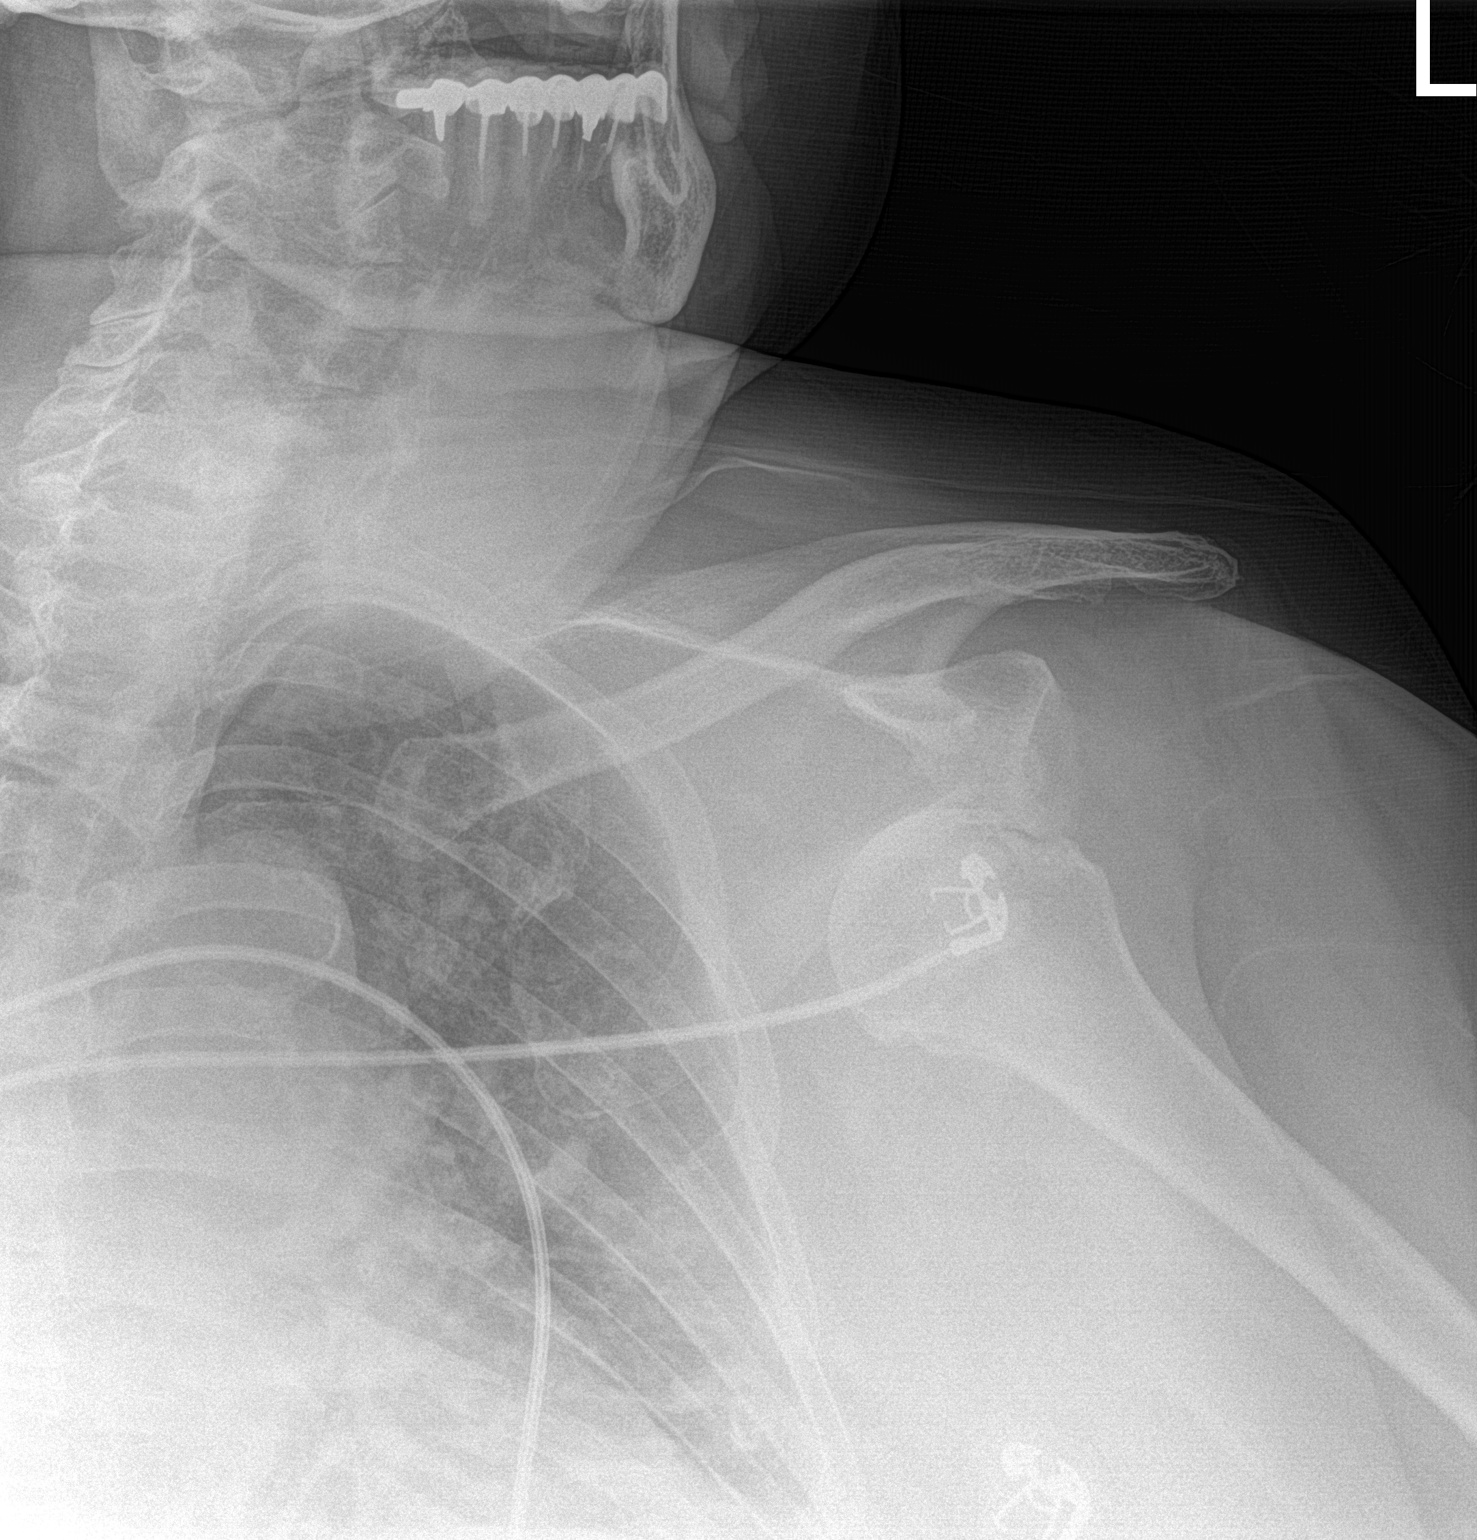

[shoulder obl]
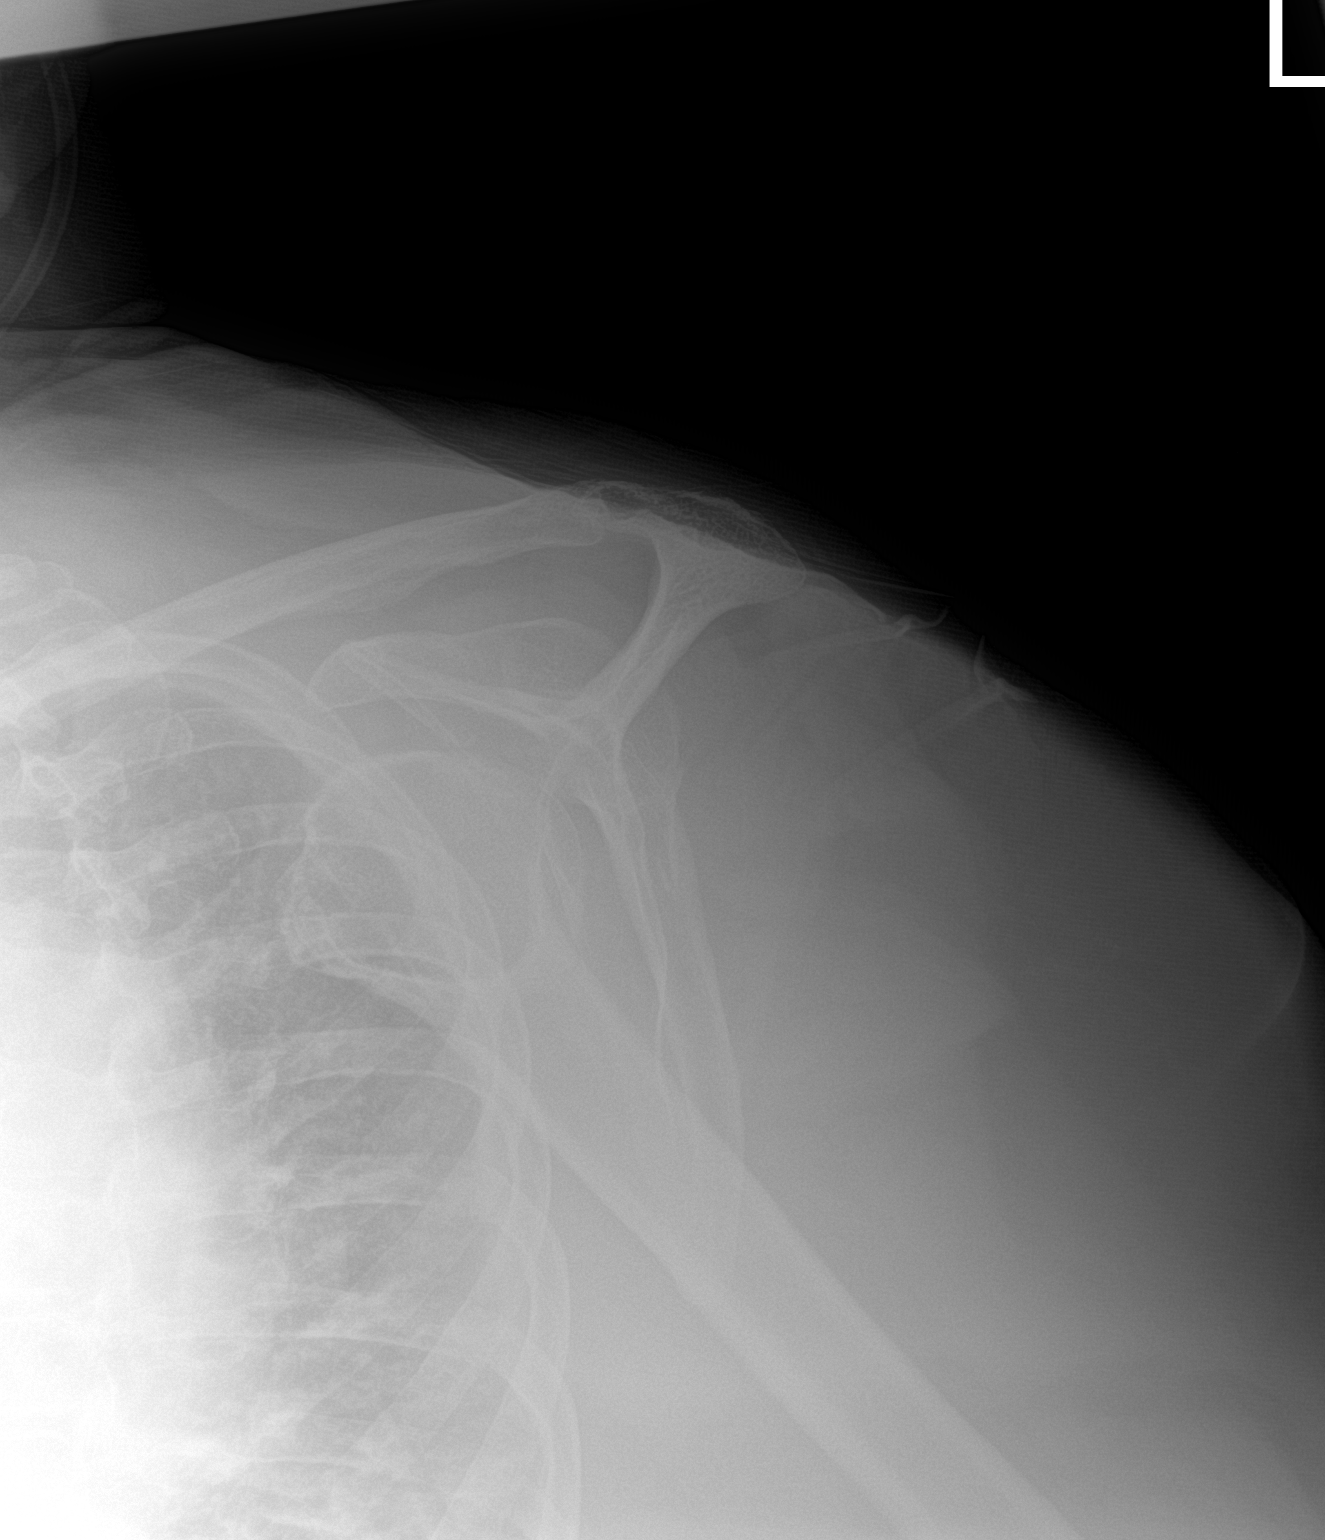

[2 of 2 positions shown; findings below may reference images not displayed]

FINDINGS: Anterior inferior dislocation of the humeral head is noted with
respect to the glenoid. Hill-Sachs deformity is noted within the
humeral head. No definitive fracture is seen. No soft tissue
abnormality is noted.
IMPRESSION: Anterior inferior dislocation of the left humeral head.
